# Patient Record
Sex: Female | Born: 1995
Health system: Southern US, Community
[De-identification: ages and names within clinical notes are randomized; demographics above are authoritative.]

## PROBLEM LIST (undated history)

## (undated) DIAGNOSIS — F410 Panic disorder [episodic paroxysmal anxiety] without agoraphobia: Secondary | ICD-10-CM

## (undated) DIAGNOSIS — L709 Acne, unspecified: Secondary | ICD-10-CM

## (undated) DIAGNOSIS — N289 Disorder of kidney and ureter, unspecified: Secondary | ICD-10-CM

## (undated) DIAGNOSIS — K259 Gastric ulcer, unspecified as acute or chronic, without hemorrhage or perforation: Secondary | ICD-10-CM

## (undated) DIAGNOSIS — K5900 Constipation, unspecified: Secondary | ICD-10-CM

## (undated) DIAGNOSIS — E282 Polycystic ovarian syndrome: Secondary | ICD-10-CM

## (undated) DIAGNOSIS — F329 Major depressive disorder, single episode, unspecified: Secondary | ICD-10-CM

## (undated) DIAGNOSIS — A6000 Herpesviral infection of urogenital system, unspecified: Secondary | ICD-10-CM

## (undated) DIAGNOSIS — K589 Irritable bowel syndrome without diarrhea: Secondary | ICD-10-CM

## (undated) DIAGNOSIS — R809 Proteinuria, unspecified: Secondary | ICD-10-CM

## (undated) DIAGNOSIS — Z6841 Body Mass Index (BMI) 40.0 and over, adult: Secondary | ICD-10-CM

## (undated) DIAGNOSIS — F419 Anxiety disorder, unspecified: Secondary | ICD-10-CM

## (undated) DIAGNOSIS — K5909 Other constipation: Secondary | ICD-10-CM

## (undated) DIAGNOSIS — J45909 Unspecified asthma, uncomplicated: Secondary | ICD-10-CM

## (undated) DIAGNOSIS — I10 Essential (primary) hypertension: Secondary | ICD-10-CM

## (undated) DIAGNOSIS — T7840XA Allergy, unspecified, initial encounter: Secondary | ICD-10-CM

## (undated) DIAGNOSIS — F32A Depression, unspecified: Secondary | ICD-10-CM

## (undated) DIAGNOSIS — R7303 Prediabetes: Secondary | ICD-10-CM

## (undated) DIAGNOSIS — E8881 Metabolic syndrome: Secondary | ICD-10-CM

## (undated) DIAGNOSIS — L309 Dermatitis, unspecified: Secondary | ICD-10-CM

## (undated) DIAGNOSIS — M1990 Unspecified osteoarthritis, unspecified site: Secondary | ICD-10-CM

## (undated) DIAGNOSIS — E559 Vitamin D deficiency, unspecified: Secondary | ICD-10-CM

## (undated) HISTORY — DX: Disorder of kidney and ureter, unspecified: N28.9

## (undated) HISTORY — DX: Herpesviral infection of urogenital system, unspecified: A60.00

## (undated) HISTORY — DX: Vitamin D deficiency, unspecified: E55.9

## (undated) HISTORY — DX: Allergy, unspecified, initial encounter: T78.40XA

## (undated) HISTORY — DX: Proteinuria, unspecified: R80.9

## (undated) HISTORY — DX: Dermatitis, unspecified: L30.9

## (undated) HISTORY — DX: Essential (primary) hypertension: I10

## (undated) HISTORY — DX: Irritable bowel syndrome, unspecified: K58.9

## (undated) HISTORY — DX: Acne, unspecified: L70.9

## (undated) HISTORY — DX: Depression, unspecified: F32.A

## (undated) HISTORY — DX: Polycystic ovarian syndrome: E28.2

## (undated) HISTORY — DX: Major depressive disorder, single episode, unspecified: F32.9

## (undated) HISTORY — DX: Body Mass Index (BMI) 40.0 and over, adult: Z684

## (undated) HISTORY — DX: Metabolic syndrome: E88.810

## (undated) HISTORY — DX: Unspecified osteoarthritis, unspecified site: M19.90

## (undated) HISTORY — DX: Prediabetes: R73.03

## (undated) HISTORY — DX: Panic disorder (episodic paroxysmal anxiety): F41.0

## (undated) HISTORY — DX: Metabolic syndrome: E88.81

## (undated) HISTORY — DX: Morbid (severe) obesity due to excess calories: E66.01

## (undated) HISTORY — DX: Gastric ulcer, unspecified as acute or chronic, without hemorrhage or perforation: K25.9

## (undated) HISTORY — DX: Constipation, unspecified: K59.00

## (undated) HISTORY — DX: Anxiety disorder, unspecified: F41.9

## (undated) HISTORY — DX: Unspecified asthma, uncomplicated: J45.909

## (undated) HISTORY — DX: Other constipation: K59.09

---
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
## 2014-03-13 ENCOUNTER — Emergency Department: Payer: Self-pay | Admitting: Internal Medicine

## 2014-11-09 ENCOUNTER — Encounter: Payer: Self-pay | Admitting: Family Medicine

## 2014-11-09 ENCOUNTER — Ambulatory Visit (INDEPENDENT_AMBULATORY_CARE_PROVIDER_SITE_OTHER): Payer: Federal, State, Local not specified - PPO | Admitting: Family Medicine

## 2014-11-09 VITALS — BP 132/86 | HR 93 | Temp 98.3°F | Resp 20 | Ht 66.0 in | Wt 253.0 lb

## 2014-11-09 DIAGNOSIS — F419 Anxiety disorder, unspecified: Secondary | ICD-10-CM | POA: Insufficient documentation

## 2014-11-09 DIAGNOSIS — K5909 Other constipation: Secondary | ICD-10-CM | POA: Insufficient documentation

## 2014-11-09 DIAGNOSIS — E559 Vitamin D deficiency, unspecified: Secondary | ICD-10-CM | POA: Insufficient documentation

## 2014-11-09 DIAGNOSIS — J069 Acute upper respiratory infection, unspecified: Secondary | ICD-10-CM

## 2014-11-09 DIAGNOSIS — R809 Proteinuria, unspecified: Secondary | ICD-10-CM | POA: Insufficient documentation

## 2014-11-09 DIAGNOSIS — E8881 Metabolic syndrome: Secondary | ICD-10-CM | POA: Insufficient documentation

## 2014-11-09 DIAGNOSIS — J302 Other seasonal allergic rhinitis: Secondary | ICD-10-CM | POA: Insufficient documentation

## 2014-11-09 DIAGNOSIS — A6009 Herpesviral infection of other urogenital tract: Secondary | ICD-10-CM | POA: Insufficient documentation

## 2014-11-09 DIAGNOSIS — J4520 Mild intermittent asthma, uncomplicated: Secondary | ICD-10-CM | POA: Insufficient documentation

## 2014-11-09 DIAGNOSIS — K589 Irritable bowel syndrome without diarrhea: Secondary | ICD-10-CM | POA: Insufficient documentation

## 2014-11-09 DIAGNOSIS — L309 Dermatitis, unspecified: Secondary | ICD-10-CM | POA: Insufficient documentation

## 2014-11-09 DIAGNOSIS — E282 Polycystic ovarian syndrome: Secondary | ICD-10-CM | POA: Insufficient documentation

## 2014-11-09 DIAGNOSIS — Z6838 Body mass index (BMI) 38.0-38.9, adult: Secondary | ICD-10-CM

## 2014-11-09 DIAGNOSIS — I10 Essential (primary) hypertension: Secondary | ICD-10-CM | POA: Insufficient documentation

## 2014-11-09 MED ORDER — BENZONATATE 100 MG PO CAPS: 100.0000 mg | ORAL_CAPSULE | Freq: Three times a day (TID) | ORAL | Status: DC | PRN

## 2014-11-09 NOTE — Progress Notes (Signed)
Name: Rachel Dunlap   MRN: [id number]    DOB: [birth year]   Date:11/09/2014       Progress Note  Subjective  Chief Complaint  Chief Complaint  Patient presents with  . URI    Onset-2 days-Scratchy throat, runny nose, and is taking OTC Mucinex and symptoms are worsening.    HPI  URI: she developed scratchy throat, rhinorrhea, nasal congestion, sinus burning, and a dry cough. She has been working many hours at work. She has been afebrile, normal appetite. No rashes  Patient Active Problem List   Diagnosis Date Noted  . Chronic constipation 11/09/2014  . Chronic dermatitis 11/09/2014  . Dysmetabolic syndrome 11/09/2014  . BP (high blood pressure) 11/09/2014  . Genital herpes in women 11/09/2014  . History of asthma 11/09/2014  . Allergic rhinitis, seasonal 11/09/2014  . Extreme obesity 11/09/2014  . IBS (irritable bowel syndrome) 11/09/2014  . Bilateral polycystic ovarian syndrome 11/09/2014  . Abnormal presence of protein in urine 11/09/2014  . Vitamin D deficiency 11/09/2014  . Anxiety 11/09/2014    No past surgical history on file.  Family History  Problem Relation Age of Onset  . Hypertension Mother     History   Social History  . Marital Status: Single    Spouse Name: N/A  . Number of Children: N/A  . Years of Education: N/A   Occupational History  . Not on file.   Social History Main Topics  . Smoking status: Never Smoker   . Smokeless tobacco: Never Used  . Alcohol Use: No  . Drug Use: No  . Sexual Activity:    Partners: Male    Birth Control/ Protection: Pill   Other Topics Concern  . Not on file   Social History Narrative     Current outpatient prescriptions:  .  atenolol (TENORMIN) 25 MG tablet, Take 1 tablet by mouth every evening., Disp: , Rfl:  .  Cholecalciferol (VITAMIN D) 2000 UNITS CAPS, Take 1 capsule by mouth daily., Disp: , Rfl:  .  desogestrel-ethinyl estradiol (APRI) 0.15-30 MG-MCG tablet, Take 1 tablet by mouth daily., Disp: ,  Rfl:  .  desoximetasone (TOPICORT) 0.25 % cream, Apply 1 application topically 2 (two) times daily., Disp: , Rfl:  .  fluticasone (FLONASE) 50 MCG/ACT nasal spray, Place 2 sprays into the nose at bedtime., Disp: , Rfl:  .  loratadine (CLARITIN) 10 MG tablet, Take 1 tablet by mouth daily., Disp: , Rfl:  .  polyethylene glycol (MIRALAX / GLYCOLAX) packet, Take by mouth., Disp: , Rfl:  .  valACYclovir (VALTREX) 500 MG tablet, Take 1 tablet by mouth 3 (three) times daily as needed., Disp: , Rfl:  .  benzonatate (TESSALON) 100 MG capsule, Take 1 capsule (100 mg total) by mouth 3 (three) times daily as needed for cough., Disp: 30 capsule, Rfl: 0 .  enalapril (VASOTEC) 5 MG tablet, Take 1 tablet by mouth daily., Disp: , Rfl:   No Known Allergies   ROS  Ten systems reviewed and is negative except as mentioned in HPI   Objective  Filed Vitals:   11/09/14 1203  BP: 132/86  Pulse: 93  Temp: 98.3 F (36.8 C)  TempSrc: Oral  Resp: 20  Height:  (1.676 m)  Weight: 253 lb (114.76 kg)  SpO2: 98%    Body mass index is 40.85 kg/(m^2).  Physical Exam  Constitutional: Patient appears well-developed and well-nourished. Obese  No distress.  HEENT: head atraumatic, normocephalic, ears normal, eyes within normal limits clear  rhinorrhea, boggy turbinates, normal throat exam, no cervical lymphadenopathy Neck: Normal range of motion. Neck supple. Cardiovascular: Normal rate, regular rhythm and normal heart sounds.  No murmur heard. No BLE edema. Pulmonary/Chest: Effort normal and breath sounds normal. No respiratory distress. Abdominal: Soft.  There is no tenderness. Psychiatric: Patient has a normal mood and affect. behavior is normal. Judgment and thought content normal.    PHQ2/9: Depression screen PHQ 2/9 11/09/2014  Decreased Interest 0  Down, Depressed, Hopeless 0  PHQ - 2 Score 0     Fall Risk: Fall Risk  11/09/2014  Falls in the past year? No    Assessment & Plan  1. Upper  respiratory infection Time off work for the next three days, rest, fluids, gargle with warm salted water, use saline spray for nasal symptoms, and continue otc medication prn, return if no improvement - benzonatate (TESSALON) 100 MG capsule; Take 1 capsule (100 mg total) by mouth 3 (three) times daily as needed for cough.  Dispense: 30 capsule; Refill: 0

## 2014-12-17 ENCOUNTER — Other Ambulatory Visit: Payer: Self-pay | Admitting: Family Medicine

## 2014-12-26 ENCOUNTER — Other Ambulatory Visit: Payer: Self-pay | Admitting: Family Medicine

## 2014-12-26 NOTE — Telephone Encounter (Signed)
Patient requesting refill. 

## 2014-12-27 NOTE — Telephone Encounter (Signed)
Tried contacting patient and voicemail not set up yet. Will try again later.

## 2015-01-02 NOTE — Telephone Encounter (Signed)
Tried contacting patient again and no voicemail set up yet.

## 2015-04-12 ENCOUNTER — Ambulatory Visit: Payer: Federal, State, Local not specified - PPO | Admitting: Family Medicine

## 2015-05-17 ENCOUNTER — Other Ambulatory Visit: Payer: Self-pay | Admitting: Family Medicine

## 2015-06-13 ENCOUNTER — Encounter: Payer: Self-pay | Admitting: Family Medicine

## 2015-06-13 ENCOUNTER — Ambulatory Visit (INDEPENDENT_AMBULATORY_CARE_PROVIDER_SITE_OTHER): Payer: Federal, State, Local not specified - PPO | Admitting: Family Medicine

## 2015-06-13 VITALS — BP 114/70 | HR 88 | Temp 98.8°F | Resp 18 | Wt 260.2 lb

## 2015-06-13 DIAGNOSIS — J4521 Mild intermittent asthma with (acute) exacerbation: Secondary | ICD-10-CM | POA: Diagnosis not present

## 2015-06-13 DIAGNOSIS — I10 Essential (primary) hypertension: Secondary | ICD-10-CM

## 2015-06-13 DIAGNOSIS — J069 Acute upper respiratory infection, unspecified: Secondary | ICD-10-CM | POA: Diagnosis not present

## 2015-06-13 DIAGNOSIS — Z23 Encounter for immunization: Secondary | ICD-10-CM | POA: Diagnosis not present

## 2015-06-13 MED ORDER — BENZONATATE 100 MG PO CAPS: 100.0000 mg | ORAL_CAPSULE | Freq: Three times a day (TID) | ORAL | Status: DC | PRN

## 2015-06-13 MED ORDER — PREDNISONE 10 MG PO TABS: 10.0000 mg | ORAL_TABLET | Freq: Every day | ORAL | Status: DC

## 2015-06-13 MED ORDER — ALBUTEROL SULFATE HFA 108 (90 BASE) MCG/ACT IN AERS: 2.0000 | INHALATION_SPRAY | Freq: Four times a day (QID) | RESPIRATORY_TRACT | Status: DC | PRN

## 2015-06-13 MED ORDER — ATENOLOL 25 MG PO TABS: 25.0000 mg | ORAL_TABLET | Freq: Every day | ORAL | Status: DC

## 2015-06-13 NOTE — Progress Notes (Signed)
Name: Rachel Dunlap  MRN: [id number]    DOB: 1995/12/26   Date:06/13/2015       Progress Note  Subjective  Chief Complaint  Chief Complaint  Patient presents with  . Sinusitis    patient presents with sx of chills, nasal congestion, sneezing, facial pain and pressure.  . Flu Vaccine    HPI  URI: she developed chills, rhinorrhea, nasal congestion, facial pressure and cough for the past two days. Appetite is finally getting back to normal.   Asthma Mild with exacerbation: no symptoms of asthma for years, but since this URI, she has noticed some loud breathing at night, possible wheezing, and a cough that is mostly dry but wet at times and has notice some SOB. She does not have an inhaler at home  HTN: she has a history of proteinuria, sees nephrologist, no recent visits with us. Denies side effects of medication. No chest pain but has occasional palpitation.    Patient Active Problem List   Diagnosis Date Noted  . Chronic constipation 11/09/2014  . Chronic dermatitis 11/09/2014  . Dysmetabolic syndrome 11/09/2014  . Hypertension, benign 11/09/2014  . Genital herpes in women 11/09/2014  . Mild intermittent asthma with acute exacerbation 11/09/2014  . Allergic rhinitis, seasonal 11/09/2014  . Extreme obesity (HCC) 11/09/2014  . IBS (irritable bowel syndrome) 11/09/2014  . Bilateral polycystic ovarian syndrome 11/09/2014  . Abnormal presence of protein in urine 11/09/2014  . Vitamin D deficiency 11/09/2014  . Anxiety 11/09/2014    History reviewed. No pertinent past surgical history.  Family History  Problem Relation Age of Onset  . Hypertension Mother     Social History   Social History  . Marital Status: Single    Spouse Name: N/A  . Number of Children: N/A  . Years of Education: N/A   Occupational History  . Not on file.   Social History Main Topics  . Smoking status: Never Smoker   . Smokeless tobacco: Never Used  . Alcohol Use: No  . Drug Use: No  .  Sexual Activity:    Partners: Male    Birth Control/ Protection: Pill   Other Topics Concern  . Not on file   Social History Narrative     Current outpatient prescriptions:  .  APRI 0.15-30 MG-MCG tablet, 1 (ONE) TABLET, ORAL, DAILY, Disp: 84 tablet, Rfl: 3 .  atenolol (TENORMIN) 25 MG tablet, Take 1 tablet (25 mg total) by mouth at bedtime., Disp: 30 tablet, Rfl: 2 .  Cholecalciferol (VITAMIN D) 2000 UNITS CAPS, Take 1 capsule by mouth daily., Disp: , Rfl:  .  desoximetasone (TOPICORT) 0.25 % cream, Apply 1 application topically 2 (two) times daily., Disp: , Rfl:  .  fluticasone (FLONASE) 50 MCG/ACT nasal spray, Place 2 sprays into the nose at bedtime., Disp: , Rfl:  .  loratadine (CLARITIN) 10 MG tablet, Take 1 tablet by mouth daily., Disp: , Rfl:  .  polyethylene glycol (MIRALAX / GLYCOLAX) packet, Take by mouth., Disp: , Rfl:  .  albuterol (PROVENTIL HFA;VENTOLIN HFA) 108 (90 Base) MCG/ACT inhaler, Inhale 2 puffs into the lungs every 6 (six) hours as needed for wheezing or shortness of breath., Disp: 1 Inhaler, Rfl: 0 .  benzonatate (TESSALON) 100 MG capsule, Take 1-2 capsules (100-200 mg total) by mouth 3 (three) times daily as needed for cough., Disp: 40 capsule, Rfl: 0 .  enalapril (VASOTEC) 5 MG tablet, Take 1 tablet by mouth daily., Disp: , Rfl:  .  predniSONE (DELTASONE)  10 MG tablet, Take 1 tablet (10 mg total) by mouth daily with breakfast., Disp: 6 tablet, Rfl: 0 .  valACYclovir (VALTREX) 500 MG tablet, TAKE 1 TABLET 3 TIMES A DAY DURING OUTBREAKS OTHERWISE TAKE 1 TABLET DAILY (Patient not taking: Reported on 06/13/2015), Disp: 35 tablet, Rfl: 5  No Known Allergies   ROS  Ten systems reviewed and is negative except as mentioned in HPI   Objective  Filed Vitals:   06/13/15 0959 06/13/15 1000  BP: 114/70   Pulse: 111 88  Temp: 98.8 F (37.1 C)   TempSrc: Oral   Resp: 18   Weight: 260 lb 3.2 oz (118.026 kg)   SpO2: 98%     Body mass index is 42.02  kg/(m^2).  Physical Exam  Constitutional: Patient appears well-developed and well-nourished. Obese  No distress.  HEENT: head atraumatic, normocephalic, pupils equal and reactive to light, ears normal TM bilaterally, clear rhinorrhea, boggy turbinates, mild tenderness during percussion of sinuses, neck supple, throat within normal limits Cardiovascular: Normal rate, regular rhythm and normal heart sounds.  No murmur heard. No BLE edema. Pulmonary/Chest: Effort normal and breath sounds normal. No respiratory distress. Abdominal: Soft.  There is no tenderness. Psychiatric: Patient has a normal mood and affect. behavior is normal. Judgment and thought content normal.   PHQ2/9: Depression screen [facility] 2/9 06/13/2015 11/09/2014  Decreased Interest 0 0  Down, Depressed, Hopeless 0 0  PHQ - 2 Score 0 0     Fall Risk: Fall Risk  06/13/2015 11/09/2014  Falls in the past year? No No     Functional Status Survey: Is the patient deaf or have difficulty hearing?: Yes (patient stated that she has a hard time hearing) Does the patient have difficulty seeing, even when wearing glasses/contacts?: No Does the patient have difficulty concentrating, remembering, or making decisions?: No Does the patient have difficulty walking or climbing stairs?: No Does the patient have difficulty dressing or bathing?: No Does the patient have difficulty doing errands alone such as visiting a doctor's office or shopping?: No    Assessment & Plan  1. Upper respiratory infection  - predniSONE (DELTASONE) 10 MG tablet; Take 1 tablet (10 mg total) by mouth daily with breakfast.  Dispense: 6 tablet; Refill: 0 - benzonatate (TESSALON) 100 MG capsule; Take 1-2 capsules (100-200 mg total) by mouth 3 (three) times daily as needed for cough.  Dispense: 40 capsule; Refill: 0  2. Flu vaccine need  - Flu Vaccine QUAD 36+ mos PF IM (Fluarix & Fluzone Quad PF)  3. Hypertension, benign  - atenolol (TENORMIN) 25 MG tablet; Take  1 tablet (25 mg total) by mouth at bedtime.  Dispense: 30 tablet; Refill: 2  4. Mild intermittent asthma with acute exacerbation  - albuterol (PROVENTIL HFA;VENTOLIN HFA) 108 (90 Base) MCG/ACT inhaler; Inhale 2 puffs into the lungs every 6 (six) hours as needed for wheezing or shortness of breath.  Dispense: 1 Inhaler; Refill: 0

## 2015-08-23 ENCOUNTER — Other Ambulatory Visit: Payer: Self-pay | Admitting: Family Medicine

## 2015-08-27 ENCOUNTER — Other Ambulatory Visit: Payer: Self-pay | Admitting: Family Medicine

## 2015-09-16 ENCOUNTER — Encounter: Payer: Self-pay | Admitting: Family Medicine

## 2015-09-16 ENCOUNTER — Ambulatory Visit (INDEPENDENT_AMBULATORY_CARE_PROVIDER_SITE_OTHER): Payer: Federal, State, Local not specified - PPO | Admitting: Family Medicine

## 2015-09-16 VITALS — BP 124/82 | HR 71 | Temp 99.1°F | Resp 16 | Ht 66.0 in | Wt 261.2 lb

## 2015-09-16 DIAGNOSIS — I10 Essential (primary) hypertension: Secondary | ICD-10-CM

## 2015-09-16 DIAGNOSIS — L689 Hypertrichosis, unspecified: Secondary | ICD-10-CM | POA: Insufficient documentation

## 2015-09-16 DIAGNOSIS — J302 Other seasonal allergic rhinitis: Secondary | ICD-10-CM | POA: Diagnosis not present

## 2015-09-16 DIAGNOSIS — L309 Dermatitis, unspecified: Secondary | ICD-10-CM | POA: Diagnosis not present

## 2015-09-16 DIAGNOSIS — A6000 Herpesviral infection of urogenital system, unspecified: Secondary | ICD-10-CM

## 2015-09-16 DIAGNOSIS — E282 Polycystic ovarian syndrome: Secondary | ICD-10-CM | POA: Diagnosis not present

## 2015-09-16 DIAGNOSIS — E8881 Metabolic syndrome: Secondary | ICD-10-CM

## 2015-09-16 DIAGNOSIS — K5900 Constipation, unspecified: Secondary | ICD-10-CM

## 2015-09-16 DIAGNOSIS — Z23 Encounter for immunization: Secondary | ICD-10-CM | POA: Diagnosis not present

## 2015-09-16 DIAGNOSIS — K5909 Other constipation: Secondary | ICD-10-CM

## 2015-09-16 DIAGNOSIS — L700 Acne vulgaris: Secondary | ICD-10-CM

## 2015-09-16 DIAGNOSIS — Z13220 Encounter for screening for lipoid disorders: Secondary | ICD-10-CM | POA: Diagnosis not present

## 2015-09-16 DIAGNOSIS — J4520 Mild intermittent asthma, uncomplicated: Secondary | ICD-10-CM

## 2015-09-16 DIAGNOSIS — E559 Vitamin D deficiency, unspecified: Secondary | ICD-10-CM

## 2015-09-16 MED ORDER — FLUTICASONE PROPIONATE 50 MCG/ACT NA SUSP: 2.0000 | Freq: Every day | NASAL | Status: DC

## 2015-09-16 MED ORDER — DESOXIMETASONE 0.25 % EX CREA: 1.0000 "application " | TOPICAL_CREAM | Freq: Two times a day (BID) | CUTANEOUS | Status: DC

## 2015-09-16 MED ORDER — ATENOLOL 25 MG PO TABS: ORAL_TABLET | ORAL | Status: DC

## 2015-09-16 MED ORDER — VALACYCLOVIR HCL 500 MG PO TABS: 500.0000 mg | ORAL_TABLET | Freq: Every day | ORAL | Status: DC

## 2015-09-16 MED ORDER — MONTELUKAST SODIUM 10 MG PO TABS: 10.0000 mg | ORAL_TABLET | Freq: Every day | ORAL | Status: DC

## 2015-09-16 MED ORDER — METFORMIN HCL 500 MG PO TABS: 500.0000 mg | ORAL_TABLET | Freq: Every day | ORAL | Status: DC

## 2015-09-16 MED ORDER — POLYETHYLENE GLYCOL 3350 17 GM/SCOOP PO POWD: 17.0000 g | Freq: Two times a day (BID) | ORAL | Status: DC | PRN

## 2015-09-16 NOTE — Progress Notes (Signed)
Name: Rachel Dunlap   MRN: [id number]    DOB: 1995-12-24   Date:09/16/2015       Progress Note  Subjective  Chief Complaint  Chief Complaint  Patient presents with  . Asthma    patient is here for her 29-month f/u  . Hypertension    no neg sx  . Medication Refill    90 day supply    HPI  Asthma Mild : no symptoms of asthma for years, only has flares when she has an URI, one episode in the last year. She is currently only using rescue inhaler before activity. Denies wheezing, no SOB or cough.  AR: taking Loratadine daily and also Fluticasone, but she has noticed clear rhinorrhea, and sometimes has a sinus headache and would like to resume singulair.   HTN: she has a history of proteinuria, sees nephrologist. Denies side effects of medication. No chest pain but has occasional palpitation. She is on Vasotec and Atenolol  Dysmetabolism syndrome: she is due for labs, weight has been stable since last visit, but has gained 7 lbs since last Summer. She has acanthosis nigricans. Denies polyphagia or polyuria but she feels thirsty all the time.  Constipation: she has been more physically active and has been drinking more water and is doing well, having bowel movements every other day, no straining, no blood in stools  Obesity: she has joined [employer] one month ago. Trying to only drink water, but she has not lost weight, discussed importance of life style modification for overall health.   Genital Herpes: not currently sexually active, no episodes since first outbreak.   Patient Active Problem List   Diagnosis Date Noted  . Chronic constipation 11/09/2014  . Chronic dermatitis 11/09/2014  . Dysmetabolic syndrome 11/09/2014  . Hypertension, benign 11/09/2014  . Genital herpes in women 11/09/2014  . Mild intermittent asthma 11/09/2014  . Allergic rhinitis, seasonal 11/09/2014  . Extreme obesity (HCC) 11/09/2014  . IBS (irritable bowel syndrome) 11/09/2014  . Bilateral  polycystic ovarian syndrome 11/09/2014  . Abnormal presence of protein in urine 11/09/2014  . Vitamin D deficiency 11/09/2014  . Anxiety 11/09/2014    History reviewed. No pertinent past surgical history.  Family History  Problem Relation Age of Onset  . Hypertension Mother     Social History   Social History  . Marital Status: Single    Spouse Name: N/A  . Number of Children: N/A  . Years of Education: N/A   Occupational History  . Not on file.   Social History Main Topics  . Smoking status: Never Smoker   . Smokeless tobacco: Never Used  . Alcohol Use: No  . Drug Use: No  . Sexual Activity:    Partners: Male    Birth Control/ Protection: Pill   Other Topics Concern  . Not on file   Social History Narrative     Current outpatient prescriptions:  .  albuterol (PROVENTIL HFA;VENTOLIN HFA) 108 (90 Base) MCG/ACT inhaler, Inhale 2 puffs into the lungs every 6 (six) hours as needed for wheezing or shortness of breath., Disp: 1 Inhaler, Rfl: 0 .  APRI 0.15-30 MG-MCG tablet, 1 (ONE) TABLET, ORAL, DAILY, Disp: 84 tablet, Rfl: 3 .  atenolol (TENORMIN) 25 MG tablet, TAKE 1 TABLET BY MOUTH EVERY EVENING FOR BP AND ANXIETY, Disp: 30 tablet, Rfl: 5 .  Cholecalciferol (VITAMIN D) 2000 UNITS CAPS, Take 1 capsule by mouth daily., Disp: , Rfl:  .  desoximetasone (TOPICORT) 0.25 % cream, Apply  1 application topically 2 (two) times daily., Disp: 100 g, Rfl: 0 .  enalapril (VASOTEC) 5 MG tablet, Take 1 tablet by mouth daily., Disp: , Rfl:  .  fluticasone (FLONASE) 50 MCG/ACT nasal spray, Place 2 sprays into both nostrils at bedtime., Disp: 16 g, Rfl: 5 .  loratadine (CLARITIN) 10 MG tablet, TAKE 1 TABLET BY MOUTH DAILY, Disp: 30 tablet, Rfl: 3 .  montelukast (SINGULAIR) 10 MG tablet, Take 1 tablet (10 mg total) by mouth at bedtime., Disp: 30 tablet, Rfl: 5 .  polyethylene glycol powder (GLYCOLAX/MIRALAX) powder, Take 17 g by mouth 2 (two) times daily as needed., Disp: 3350 g, Rfl: 1 .   valACYclovir (VALTREX) 500 MG tablet, TAKE 1 TABLET 3 TIMES A DAY DURING OUTBREAKS OTHERWISE TAKE 1 TABLET DAILY, Disp: 35 tablet, Rfl: 5  No Known Allergies   ROS  Constitutional: Negative for fever or weight change.  Respiratory: Negative for cough and shortness of breath.   Cardiovascular: Negative for chest pain or palpitations.  Gastrointestinal: Negative for abdominal pain, no bowel changes ( improved constipation because she has been exercising and drinking more water).  Musculoskeletal: Negative for gait problem or joint swelling.  Skin: Negative for rash.  Neurological: Negative for dizziness or headache.  No other specific complaints in a complete review of systems (except as listed in HPI above).  Objective  Filed Vitals:   09/16/15 1015  BP: 124/82  Pulse: 71  Temp: 99.1 F (37.3 C)  TempSrc: Oral  Resp: 16  Height: 5\' 6"  (1.676 m)  Weight: 261 lb 3.2 oz (118.48 kg)  SpO2: 98%    Body mass index is 42.18 kg/(m^2).  Physical Exam    Constitutional: Patient appears well-developed and well-nourished. Obese  No distress.  HEENT: head atraumatic, normocephalic, pupils equal and reactive to light,  neck supple, throat within normal limits Cardiovascular: Normal rate, regular rhythm and normal heart sounds.  No murmur heard. No BLE edema. Pulmonary/Chest: Effort normal and breath sounds normal. No respiratory distress. Abdominal: Soft.  There is no tenderness. Psychiatric: Patient has a normal mood and affect. behavior is normal. Judgment and thought content normal. Skin : acanthosis nigricans neck and axilla, facial acne, hypertrichosis on her chin  PHQ2/9: [facility] screen [facility] 2/9 09/16/2015 06/13/2015 11/09/2014  Decreased Interest 0 0 0  Down, Depressed, Hopeless 0 0 0  PHQ - 2 Score 0 0 0     Fall Risk: Fall Risk  09/16/2015 06/13/2015 11/09/2014  Falls in the past year? No No No     Functional Status Survey: Is the patient deaf or have difficulty hearing?:  No Does the patient have difficulty seeing, even when wearing glasses/contacts?: No Does the patient have difficulty concentrating, remembering, or making decisions?: No Does the patient have difficulty walking or climbing stairs?: No Does the patient have difficulty dressing or bathing?: No Does the patient have difficulty doing errands alone such as visiting a doctor's office or shopping?: No    Assessment & Plan  1. Hypertension, benign  At goal, continue follow up with Dr. Ronn MelenaKolloru - Comprehensive metabolic panel - CBC with Differential/Platelet - atenolol (TENORMIN) 25 MG tablet; TAKE 1 TABLET BY MOUTH EVERY EVENING FOR BP AND ANXIETY  Dispense: 90 tablet; Refill: 1  2. Mild intermittent asthma, uncomplicated  Using inhaler only before activity, would like to add singulair to help with allergy symptoms - montelukast (SINGULAIR) 10 MG tablet; Take 1 tablet (10 mg total) by mouth at bedtime.  Dispense: 90 tablet; Refill:  1  3. Morbid obesity, unspecified obesity type [facility])  Discussed with the patient the risk posed by an increased BMI. Discussed importance of portion control, calorie counting and at least 150 minutes of physical activity weekly. Avoid sweet beverages and drink more water. Eat at least 6 servings of fruit and vegetables daily   4. Chronic constipation  - polyethylene glycol powder (GLYCOLAX/MIRALAX) powder; Take 17 g by mouth 2 (two) times daily as needed.  Dispense: 3350 g; Refill: 1  5. Chronic dermatitis  - Ambulatory referral to Dermatology  6. Allergic rhinitis, seasonal  - montelukast (SINGULAIR) 10 MG tablet; Take 1 tablet (10 mg total) by mouth at bedtime.  Dispense: 90 tablet; Refill: 1 - fluticasone (FLONASE) 50 MCG/ACT nasal spray; Place 2 sprays into both nostrils at bedtime.  Dispense: 48 g; Refill: 1  7. Need for varicella vaccine  - Varicella vaccine subcutaneous  8. Need for meningococcal vaccination  - Meningococcal conjugate vaccine  4-valent IM  9. Lipid screening  - Lipid panel  10. Metabolic syndrome  - Hemoglobin A1c - Comprehensive metabolic panel  11. Bilateral polycystic ovarian syndrome  - Hemoglobin A1c Starting her on Metformin 500 mg daily #90 and 1 refill, possible side effects discussed with patient  12. Vitamin D deficiency  - VITAMIN D 25 Hydroxy (Vit-D Deficiency, Fractures)  13. Genital herpes  - valACYclovir (VALTREX) 500 MG tablet; Take 1 tablet (500 mg total) by mouth daily. And twice daily on outbreaks  Dispense: 115 tablet; Refill: 1  14. Acne vulgaris  Referral to dermatologist  15. Hypertrichosis  Referral to dermatologist

## 2015-11-01 DIAGNOSIS — L700 Acne vulgaris: Secondary | ICD-10-CM | POA: Diagnosis not present

## 2015-11-22 DIAGNOSIS — R809 Proteinuria, unspecified: Secondary | ICD-10-CM | POA: Diagnosis not present

## 2015-11-22 DIAGNOSIS — I10 Essential (primary) hypertension: Secondary | ICD-10-CM | POA: Diagnosis not present

## 2015-11-28 ENCOUNTER — Encounter: Payer: Self-pay | Admitting: Family Medicine

## 2015-12-10 ENCOUNTER — Ambulatory Visit (INDEPENDENT_AMBULATORY_CARE_PROVIDER_SITE_OTHER): Payer: Federal, State, Local not specified - PPO

## 2015-12-10 DIAGNOSIS — Z23 Encounter for immunization: Secondary | ICD-10-CM

## 2015-12-28 ENCOUNTER — Other Ambulatory Visit: Payer: Self-pay | Admitting: Family Medicine

## 2016-01-21 DIAGNOSIS — K080 Exfoliation of teeth due to systemic causes: Secondary | ICD-10-CM | POA: Diagnosis not present

## 2016-02-24 ENCOUNTER — Other Ambulatory Visit: Payer: Self-pay

## 2016-02-24 DIAGNOSIS — J302 Other seasonal allergic rhinitis: Secondary | ICD-10-CM

## 2016-02-24 DIAGNOSIS — J4520 Mild intermittent asthma, uncomplicated: Secondary | ICD-10-CM

## 2016-02-24 MED ORDER — MONTELUKAST SODIUM 10 MG PO TABS: 10.0000 mg | ORAL_TABLET | Freq: Every day | ORAL | 1 refills | Status: DC

## 2016-02-24 NOTE — Telephone Encounter (Signed)
Patient requesting refill of Montelukast Sodium.
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
## 2016-03-13 ENCOUNTER — Ambulatory Visit (INDEPENDENT_AMBULATORY_CARE_PROVIDER_SITE_OTHER): Payer: Federal, State, Local not specified - PPO | Admitting: Family Medicine

## 2016-03-13 ENCOUNTER — Other Ambulatory Visit: Payer: Self-pay | Admitting: Family Medicine

## 2016-03-13 ENCOUNTER — Encounter: Payer: Self-pay | Admitting: Family Medicine

## 2016-03-13 VITALS — BP 132/82 | HR 99 | Temp 98.5°F | Resp 18 | Ht 66.25 in | Wt 261.9 lb

## 2016-03-13 DIAGNOSIS — Z13220 Encounter for screening for lipoid disorders: Secondary | ICD-10-CM

## 2016-03-13 DIAGNOSIS — J302 Other seasonal allergic rhinitis: Secondary | ICD-10-CM

## 2016-03-13 DIAGNOSIS — Z114 Encounter for screening for human immunodeficiency virus [HIV]: Secondary | ICD-10-CM

## 2016-03-13 DIAGNOSIS — Z124 Encounter for screening for malignant neoplasm of cervix: Secondary | ICD-10-CM | POA: Diagnosis not present

## 2016-03-13 DIAGNOSIS — J3089 Other allergic rhinitis: Secondary | ICD-10-CM

## 2016-03-13 DIAGNOSIS — E559 Vitamin D deficiency, unspecified: Secondary | ICD-10-CM | POA: Diagnosis not present

## 2016-03-13 DIAGNOSIS — K5909 Other constipation: Secondary | ICD-10-CM | POA: Diagnosis not present

## 2016-03-13 DIAGNOSIS — L2082 Flexural eczema: Secondary | ICD-10-CM | POA: Diagnosis not present

## 2016-03-13 DIAGNOSIS — A6004 Herpesviral vulvovaginitis: Secondary | ICD-10-CM

## 2016-03-13 DIAGNOSIS — E282 Polycystic ovarian syndrome: Secondary | ICD-10-CM | POA: Diagnosis not present

## 2016-03-13 DIAGNOSIS — E8881 Metabolic syndrome: Secondary | ICD-10-CM | POA: Diagnosis not present

## 2016-03-13 DIAGNOSIS — J4520 Mild intermittent asthma, uncomplicated: Secondary | ICD-10-CM | POA: Diagnosis not present

## 2016-03-13 DIAGNOSIS — E668 Other obesity: Secondary | ICD-10-CM

## 2016-03-13 DIAGNOSIS — Z01419 Encounter for gynecological examination (general) (routine) without abnormal findings: Secondary | ICD-10-CM | POA: Diagnosis not present

## 2016-03-13 DIAGNOSIS — I10 Essential (primary) hypertension: Secondary | ICD-10-CM

## 2016-03-13 LAB — COMPLETE METABOLIC PANEL WITH GFR
ALBUMIN: 3.8 g/dL (ref 3.6–5.1)
ALK PHOS: 55 U/L (ref 33–115)
ALT: 9 U/L (ref 6–29)
AST: 14 U/L (ref 10–30)
BUN: 9 mg/dL (ref 7–25)
CALCIUM: 9.1 mg/dL (ref 8.6–10.2)
CHLORIDE: 106 mmol/L (ref 98–110)
CO2: 20 mmol/L (ref 20–31)
Creat: 0.79 mg/dL (ref 0.50–1.10)
Glucose, Bld: 89 mg/dL (ref 65–99)
POTASSIUM: 4.6 mmol/L (ref 3.5–5.3)
Sodium: 137 mmol/L (ref 135–146)
Total Bilirubin: 0.5 mg/dL (ref 0.2–1.2)
Total Protein: 7.1 g/dL (ref 6.1–8.1)

## 2016-03-13 LAB — LIPID PANEL
CHOL/HDL RATIO: 2.7 ratio (ref <5.0)
CHOLESTEROL: 164 mg/dL (ref <200)
HDL: 60 mg/dL (ref >50)
LDL Cholesterol: 84 mg/dL (ref <100)
TRIGLYCERIDES: 100 mg/dL (ref <150)
VLDL: 20 mg/dL (ref <30)

## 2016-03-13 MED ORDER — DESOXIMETASONE 0.25 % EX CREA: 1.0000 "application " | TOPICAL_CREAM | Freq: Two times a day (BID) | CUTANEOUS | 0 refills | Status: DC

## 2016-03-13 MED ORDER — METFORMIN HCL 500 MG PO TABS: 500.0000 mg | ORAL_TABLET | Freq: Every day | ORAL | 1 refills | Status: DC

## 2016-03-13 MED ORDER — VALACYCLOVIR HCL 500 MG PO TABS: 500.0000 mg | ORAL_TABLET | Freq: Every day | ORAL | 1 refills | Status: DC

## 2016-03-13 MED ORDER — FLUTICASONE PROPIONATE 50 MCG/ACT NA SUSP: 2.0000 | Freq: Every day | NASAL | 1 refills | Status: DC

## 2016-03-13 MED ORDER — ATENOLOL 25 MG PO TABS: ORAL_TABLET | ORAL | 1 refills | Status: DC

## 2016-03-13 NOTE — Patient Instructions (Signed)
Therapist : [place] Pinkasis

## 2016-03-13 NOTE — Progress Notes (Signed)
Name: Rachel Dunlap   MRN: [id number]    DOB: 20-Jan-1996   Date:03/13/2016       Progress Note  Subjective  Chief Complaint  Chief Complaint  Patient presents with  . Annual Exam    HPI   Well Woman: she had oral sex in the past and contracted genital herpes, never had intercourse. She denies breat lumps, no herpes outbreak since 2016 during her first outbreak. She takes medication daily. She denies vaginal discharge cycles are regular and takes ocp's to regulate cycles and PCOS.  PCOS:she has obesity, HTN, acne and hirsutism, and history of oligomenorrhea.  Asthma Mild : no symptoms of asthma for years, only has flares when she has an URI, one episode in the last year. She is currently only using rescue inhaler before activity. Denies wheezing, no SOB but has noticed a mild dry cough over the past few days. She is having symptoms of URI and is taking otc medication  AR: taking Loratadine, singulair daily and also Fluticasone most days to control symptoms. Usually has rhinorrhea and nasal congestion when she does not take medication  HTN: she has a history of proteinuria, sees nephrologist, Dr. Ronn MelenaKolloru. Denies side effects of medication - Vasotec. No chest pain but has occasional palpitation. She is on Vasotec and Atenolol  Dysmetabolism syndrome:  weight has been stable since last visit She has acanthosis nigricans. Denies polyphagia or polyuria but she feels thirsty all the time.  Constipation: she has been more physically active and has been drinking more water and is doing well, having bowel movements every other day, no straining, no blood in stools . She is doing well with life style modification  Obesity: she has joined [employer] 7 months ago, but she had to stop during the end of Fall semester, but she will go back this month.   Genital Herpes: not currently sexually active, no episodes since first outbreak in 2016 . She is still very upset about having  herpes, and the fact it was from oral sex and she never had intercourse. She is afraid of having a relationship with anyone else. Thinking about therapy    Patient Active Problem List   Diagnosis Date Noted  . Acne vulgaris 09/16/2015  . Hypertrichosis 09/16/2015  . Chronic constipation 11/09/2014  . Chronic dermatitis 11/09/2014  . Dysmetabolic syndrome 11/09/2014  . Hypertension, benign 11/09/2014  . Genital herpes in women 11/09/2014  . Mild intermittent asthma 11/09/2014  . Allergic rhinitis, seasonal 11/09/2014  . Extreme obesity (HCC) 11/09/2014  . IBS (irritable bowel syndrome) 11/09/2014  . Bilateral polycystic ovarian syndrome 11/09/2014  . Abnormal presence of protein in urine 11/09/2014  . Vitamin D deficiency 11/09/2014  . Anxiety 11/09/2014    History reviewed. No pertinent surgical history.  Family History  Problem Relation Age of Onset  . Hypertension Mother     Social History   Social History  . Marital status: Single    Spouse name: N/A  . Number of children: N/A  . Years of education: N/A   Occupational History  . Not on file.   Social History Main Topics  . Smoking status: Never Smoker  . Smokeless tobacco: Never Used  . Alcohol use No  . Drug use: No  . Sexual activity: Not Currently    Partners: Male    Birth control/ protection: Pill   Other Topics Concern  . Not on file   Social History Narrative  . No narrative on file  Current Outpatient Prescriptions:  .  albuterol (PROVENTIL HFA;VENTOLIN HFA) 108 (90 Base) MCG/ACT inhaler, Inhale 2 puffs into the lungs every 6 (six) hours as needed for wheezing or shortness of breath., Disp: 1 Inhaler, Rfl: 0 .  APRI 0.15-30 MG-MCG tablet, 1 (ONE) TABLET, ORAL, DAILY, Disp: 84 tablet, Rfl: 3 .  atenolol (TENORMIN) 25 MG tablet, TAKE 1 TABLET BY MOUTH EVERY EVENING FOR BP AND ANXIETY, Disp: 90 tablet, Rfl: 1 .  Cholecalciferol (VITAMIN D) 2000 UNITS CAPS, Take 1 capsule by mouth daily., Disp: ,  Rfl:  .  desoximetasone (TOPICORT) 0.25 % cream, Apply 1 application topically 2 (two) times daily., Disp: 100 g, Rfl: 0 .  enalapril (VASOTEC) 5 MG tablet, Take 1 tablet by mouth daily., Disp: , Rfl:  .  fluticasone (FLONASE) 50 MCG/ACT nasal spray, Place 2 sprays into both nostrils at bedtime., Disp: 48 g, Rfl: 1 .  loratadine (CLARITIN) 10 MG tablet, TAKE 1 TABLET BY MOUTH DAILY, Disp: 30 tablet, Rfl: 3 .  metFORMIN (GLUCOPHAGE) 500 MG tablet, Take 1 tablet (500 mg total) by mouth daily with breakfast., Disp: 90 tablet, Rfl: 1 .  montelukast (SINGULAIR) 10 MG tablet, Take 1 tablet (10 mg total) by mouth at bedtime., Disp: 90 tablet, Rfl: 1 .  polyethylene glycol powder (GLYCOLAX/MIRALAX) powder, Take 17 g by mouth 2 (two) times daily as needed., Disp: 3350 g, Rfl: 1 .  valACYclovir (VALTREX) 500 MG tablet, Take 1 tablet (500 mg total) by mouth daily. And twice daily on outbreaks, Disp: 115 tablet, Rfl: 1  No Known Allergies   ROS  Constitutional: Negative for fever or weight change.  Respiratory: Positive for cough but no  shortness of breath.   Cardiovascular: Negative for chest pain, occasionally has  palpitations.  Gastrointestinal: Negative for abdominal pain, no bowel changes.  Musculoskeletal: Negative for gait problem or joint swelling.  Skin: Negative for rash.   Neurological: Negative for dizziness or headache.  No other specific complaints in a complete review of systems (except as listed in HPI above).  Objective  Vitals:   03/13/16 0833  BP: 132/82  Pulse: 99  Resp: 18  Temp: 98.5 F (36.9 C)  TempSrc: Oral  SpO2: 98%  Weight: 261 lb 14.4 oz (118.8 kg)  Height: 5' 6.25" (1.683 m)    Body mass index is 41.95 kg/m.  Physical Exam  Constitutional: Patient appears well-developed and well-nourished.Obese . No distress.  HENT: Head: Normocephalic and atraumatic. Ears: B TMs ok, no erythema or effusion; Nose: Nose normal. Mouth/Throat: Oropharynx is clear and  moist. No oropharyngeal exudate.  Eyes: Conjunctivae and EOM are normal. Pupils are equal, round, and reactive to light. No scleral icterus.  Neck: Normal range of motion. Neck supple. No JVD present. No thyromegaly present.  Cardiovascular: Normal rate, regular rhythm and normal heart sounds.  No murmur heard. No BLE edema. Pulmonary/Chest: Effort normal and breath sounds normal. No respiratory distress. Abdominal: Soft. Bowel sounds are normal, no distension. There is no tenderness. no masses Breast: no lumps or masses, no nipple discharge or rashes FEMALE GENITALIA:  External genitalia normal External urethra normal Vaginal vault normal without discharge or lesions Cervix normal without discharge or lesions Bimanual exam normal without masses RECTAL: not done Musculoskeletal: Normal range of motion, no joint effusions. No gross deformities Neurological: he is alert and oriented to person, place, and time. No cranial nerve deficit. Coordination, balance, strength, speech and gait are normal.  Skin: Skin is warm and dry. No rash noted.  No erythema. acanthosis nigricans, hypertrichosis on chin Psychiatric: Patient has a normal mood and affect. behavior is normal. Judgment and thought content normal.  PHQ2/9: [facility] 03/13/2016 09/16/2015 06/13/2015 11/09/2014  Decreased Interest 0 0 0 0  Down, Depressed, Hopeless 0 0 0 0  PHQ - 2 Score 0 0 0 0     Fall Risk: Fall Risk  03/13/2016 09/16/2015 06/13/2015 11/09/2014  Falls in the past year? No No No No    Functional Status Survey: Is the patient deaf or have difficulty hearing?: No Does the patient have difficulty seeing, even when wearing glasses/contacts?: No Does the patient have difficulty concentrating, remembering, or making decisions?: No Does the patient have difficulty walking or climbing stairs?: No Does the patient have difficulty dressing or bathing?: No Does the patient have difficulty doing errands alone such as  visiting a doctor's office or shopping?: No    Assessment & Plan  1. Well woman exam  Discussed importance of 150 minutes of physical activity weekly, eat two servings of fish weekly, eat one serving of tree nuts ( cashews, pistachios, pecans, almonds.Marland Kitchen.) every other day, eat 6 servings of fruit/vegetables daily and drink plenty of water and avoid sweet beverages.   2. Extreme obesity (HCC)  Discussed with the patient the risk posed by an increased BMI. Discussed importance of portion control, calorie counting and at least 150 minutes of physical activity weekly. Avoid sweet beverages and drink more water. Eat at least 6 servings of fruit and vegetables daily   3. Dysmetabolic syndrome  - Hemoglobin A1c  4. Hypertension, benign  - atenolol (TENORMIN) 25 MG tablet; TAKE 1 TABLET BY MOUTH EVERY EVENING FOR BP AND ANXIETY  Dispense: 90 tablet; Refill: 1 - COMPLETE METABOLIC PANEL WITH GFR  5. Vitamin D deficiency  - VITAMIN D 25 Hydroxy (Vit-D Deficiency, Fractures)  6. Herpes simplex vulvovaginitis  - valACYclovir (VALTREX) 500 MG tablet; Take 1 tablet (500 mg total) by mouth daily. And twice daily on outbreaks  Dispense: 115 tablet; Refill: 1  7. Metabolic syndrome  - metFORMIN (GLUCOPHAGE) 500 MG tablet; Take 1 tablet (500 mg total) by mouth daily with breakfast.  Dispense: 90 tablet; Refill: 1  8. Bilateral polycystic ovarian syndrome  - metFORMIN (GLUCOPHAGE) 500 MG tablet; Take 1 tablet (500 mg total) by mouth daily with breakfast.  Dispense: 90 tablet; Refill: 1  9. Perennial allergic rhinitis with seasonal variation  - fluticasone (FLONASE) 50 MCG/ACT nasal spray; Place 2 sprays into both nostrils at bedtime.  Dispense: 48 g; Refill: 1  10. Flexural eczema  - desoximetasone (TOPICORT) 0.25 % cream; Apply 1 application topically 2 (two) times daily.  Dispense: 100 g; Refill: 0  11. Encounter for screening for HIV  - HIV antibody  12. Lipid screening  - Lipid  panel  13. Cervical cancer screening  - Pap IG and Chlamydia/Gonococcus, NAA

## 2016-03-14 LAB — HIV ANTIBODY (ROUTINE TESTING W REFLEX): HIV 1&2 Ab, 4th Generation: NONREACTIVE

## 2016-03-14 LAB — VITAMIN D 25 HYDROXY (VIT D DEFICIENCY, FRACTURES): VIT D 25 HYDROXY: 34 ng/mL (ref 30–100)

## 2016-03-14 LAB — HEMOGLOBIN A1C
Hgb A1c MFr Bld: 5.5 % (ref <5.7)
MEAN PLASMA GLUCOSE: 111 mg/dL

## 2016-03-17 LAB — PAP IG AND CT-NG NAA
CHLAMYDIA PROBE AMP: NOT DETECTED
GC PROBE AMP: NOT DETECTED

## 2016-04-02 DIAGNOSIS — K080 Exfoliation of teeth due to systemic causes: Secondary | ICD-10-CM | POA: Diagnosis not present

## 2016-04-15 DIAGNOSIS — S161XXA Strain of muscle, fascia and tendon at neck level, initial encounter: Secondary | ICD-10-CM | POA: Diagnosis not present

## 2016-04-15 DIAGNOSIS — S39012A Strain of muscle, fascia and tendon of lower back, initial encounter: Secondary | ICD-10-CM | POA: Diagnosis not present

## 2016-04-30 ENCOUNTER — Other Ambulatory Visit: Payer: Self-pay | Admitting: Family Medicine

## 2016-04-30 NOTE — Telephone Encounter (Signed)
Patient requesting refill of [facility] to [facility].

## 2016-07-21 ENCOUNTER — Encounter: Payer: Self-pay | Admitting: Family Medicine

## 2016-07-21 ENCOUNTER — Ambulatory Visit (INDEPENDENT_AMBULATORY_CARE_PROVIDER_SITE_OTHER): Payer: Federal, State, Local not specified - PPO | Admitting: Family Medicine

## 2016-07-21 VITALS — BP 140/78 | HR 95 | Temp 98.3°F | Resp 16 | Wt 263.4 lb

## 2016-07-21 DIAGNOSIS — K582 Mixed irritable bowel syndrome: Secondary | ICD-10-CM | POA: Diagnosis not present

## 2016-07-21 DIAGNOSIS — M791 Myalgia: Secondary | ICD-10-CM

## 2016-07-21 DIAGNOSIS — M7918 Myalgia, other site: Secondary | ICD-10-CM

## 2016-07-21 DIAGNOSIS — R140 Abdominal distension (gaseous): Secondary | ICD-10-CM | POA: Diagnosis not present

## 2016-07-21 DIAGNOSIS — R1013 Epigastric pain: Secondary | ICD-10-CM | POA: Diagnosis not present

## 2016-07-21 LAB — CBC WITH DIFFERENTIAL/PLATELET
BASOS PCT: 0 %
Basophils Absolute: 0 cells/uL (ref 0–200)
Eosinophils Absolute: 170 cells/uL (ref 15–500)
Eosinophils Relative: 2 %
HEMATOCRIT: 36.6 % (ref 35.0–45.0)
Hemoglobin: 11.8 g/dL (ref 11.7–15.5)
LYMPHS PCT: 26 %
Lymphs Abs: 2210 cells/uL (ref 850–3900)
MCH: 26.3 pg — ABNORMAL LOW (ref 27.0–33.0)
MCHC: 32.2 g/dL (ref 32.0–36.0)
MCV: 81.5 fL (ref 80.0–100.0)
MONO ABS: 935 {cells}/uL (ref 200–950)
MPV: 10.7 fL (ref 7.5–12.5)
Monocytes Relative: 11 %
NEUTROS ABS: 5185 {cells}/uL (ref 1500–7800)
Neutrophils Relative %: 61 %
PLATELETS: 243 10*3/uL (ref 140–400)
RBC: 4.49 MIL/uL (ref 3.80–5.10)
RDW: 13.9 % (ref 11.0–15.0)
WBC: 8.5 10*3/uL (ref 3.8–10.8)

## 2016-07-21 MED ORDER — OMEPRAZOLE 40 MG PO CPDR: 40.0000 mg | DELAYED_RELEASE_CAPSULE | Freq: Every day | ORAL | 3 refills | Status: DC

## 2016-07-21 MED ORDER — BACLOFEN 20 MG PO TABS: 20.0000 mg | ORAL_TABLET | Freq: Three times a day (TID) | ORAL | 0 refills | Status: DC

## 2016-07-21 NOTE — Patient Instructions (Signed)
Irritable Bowel Syndrome, Adult Irritable bowel syndrome (IBS) is not one specific disease. It is a group of symptoms that affects the organs responsible for digestion (gastrointestinal or GI tract). To regulate how your GI tract works, your body sends signals back and forth between your intestines and your brain. If you have IBS, there may be a problem with these signals. As a result, your GI tract does not function normally. Your intestines may become more sensitive and overreact to certain things. This is especially true when you eat certain foods or when you are under stress. There are four types of IBS. These may be determined based on the consistency of your stool:  IBS with diarrhea.  IBS with constipation.  Mixed IBS.  Unsubtyped IBS. It is important to know which type of IBS you have. Some treatments are more likely to be helpful for certain types of IBS. What are the causes? The exact cause of IBS is not known. What increases the risk? You may have a higher risk of IBS if:  You are a woman.  You are younger than 23 years old.  You have a family history of IBS.  You have mental health problems.  You have had bacterial infection of your GI tract. What are the signs or symptoms? Symptoms of IBS vary from person to person. The main symptom is abdominal pain or discomfort. Additional symptoms usually include one or more of the following:  Diarrhea, constipation, or both.  Abdominal swelling or bloating.  Feeling full or sick after eating a small or regular-size meal.  Frequent gas.  Mucus in the stool.  A feeling of having more stool left after a bowel movement. Symptoms tend to come and go. They may be associated with stress, psychiatric conditions, or nothing at all. How is this diagnosed? There is no specific test to diagnose IBS. Your health care provider will make a diagnosis based on a physical exam, medical history, and your symptoms. You may have other tests to  rule out other conditions that may be causing your symptoms. These may include:  Blood tests.  X-rays.  CT scan.  Endoscopy and colonoscopy. This is a test in which your GI tract is viewed with a long, thin, flexible tube. How is this treated? There is no cure for IBS, but treatment can help relieve symptoms. IBS treatment often includes:  Changes to your diet, such as:  Eating more fiber.  Avoiding foods that cause symptoms.  Drinking more water.  Eating regular, medium-sized portioned meals.  Medicines. These may include:  Fiber supplements if you have constipation.  Medicine to control diarrhea (antidiarrheal medicines).  Medicine to help control muscle spasms in your GI tract (antispasmodic medicines).  Medicines to help with any mental health issues, such as antidepressants or tranquilizers.  Therapy.  Talk therapy may help with anxiety, depression, or other mental health issues that can make IBS symptoms worse.  Stress reduction.  Managing your stress can help keep symptoms under control. Follow these instructions at home:  Take medicines only as directed by your health care provider.  Eat a healthy diet.  Avoid foods and drinks with added sugar.  Include more whole grains, fruits, and vegetables gradually into your diet. This may be especially helpful if you have IBS with constipation.  Avoid any foods and drinks that make your symptoms worse. These may include dairy products and caffeinated or carbonated drinks.  Do not eat large meals.  Drink enough fluid to keep your urine   clear or pale yellow.  Exercise regularly. Ask your health care provider for recommendations of good activities for you.  Keep all follow-up visits as directed by your health care provider. This is important. Contact a health care provider if:  You have constant pain.  You have trouble or pain with swallowing.  You have worsening diarrhea. Get help right away if:  You  have severe and worsening abdominal pain.  You have diarrhea and:  You have a rash, stiff neck, or severe headache.  You are irritable, sleepy, or difficult to awaken.  You are weak, dizzy, or extremely thirsty.  You have bright red blood in your stool or you have black tarry stools.  You have unusual abdominal swelling that is painful.  You vomit continuously.  You vomit blood (hematemesis).  You have both abdominal pain and a fever. This information is not intended to replace advice given to you by your health care provider. Make sure you discuss any questions you have with your health care provider. Document Released: 03/23/2005 Document Revised: 08/23/2015 Document Reviewed: 12/08/2013 Elsevier Interactive Patient Education  2017 Elsevier Inc.  

## 2016-07-21 NOTE — Progress Notes (Signed)
Name: Rachel Dunlap   MRN: [id number]    DOB: Aug 19, 1995   Date:07/21/2016       Progress Note  Subjective  Chief Complaint  Chief Complaint  Patient presents with  . Abdominal Pain    upper for 3 weeks cramps and diarrhea more frequently    HPI  Epigastric pain: she states that over the past few weeks she has noticed intermittent epigastric pain, she is not sure how to describe it, radiates to back, usually prior to meals. Denies nausea or vomiting, but has also noticed bowel movements going from increase in frequency and loose stools to hard stools, but no blood or mucus. No fever or chills. She has been feeling very tired and stressed out. 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 She has been calling out sick because of increase in fatigue, inability to focus and mood changes  Patient Active Problem List   Diagnosis Date Noted  . Acne vulgaris 09/16/2015  . Hypertrichosis 09/16/2015  . Chronic constipation 11/09/2014  . Chronic dermatitis 11/09/2014  . Dysmetabolic syndrome 11/09/2014  . Hypertension, benign 11/09/2014  . Genital herpes in women 11/09/2014  . Mild intermittent asthma 11/09/2014  . Allergic rhinitis, seasonal 11/09/2014  . Extreme obesity 11/09/2014  . IBS (irritable bowel syndrome) 11/09/2014  . Bilateral polycystic ovarian syndrome 11/09/2014  . Abnormal presence of protein in urine 11/09/2014  . Vitamin D deficiency 11/09/2014  . Anxiety 11/09/2014    No past surgical history on file.  Family History  Problem Relation Age of Onset  . Hypertension Mother     Social History   Social History  . Marital status: Single    Spouse name: N/A  . Number of children: N/A  . Years of education: N/A   Occupational History  . Not on file.   Social History Main Topics  . Smoking status: Never  Smoker  . Smokeless tobacco: Never Used  . Alcohol use No  . Drug use: No  . Sexual activity: Not Currently    Partners: Male    Birth control/ protection: Pill   Other Topics Concern  . Not on file   Social History Narrative  . No narrative on file     Current Outpatient Prescriptions:  .  albuterol (PROVENTIL HFA;VENTOLIN HFA) 108 (90 Base) MCG/ACT inhaler, Inhale 2 puffs into the lungs every 6 (six) hours as needed for wheezing or shortness of breath., Disp: 1 Inhaler, Rfl: 0 .  APRI 0.15-30 MG-MCG tablet, 1 (ONE) TABLET, ORAL, DAILY, Disp: 84 tablet, Rfl: 0 .  atenolol (TENORMIN) 25 MG tablet, TAKE 1 TABLET BY MOUTH EVERY EVENING FOR BP AND ANXIETY, Disp: 90 tablet, Rfl: 1 .  Cholecalciferol (VITAMIN D) 2000 UNITS CAPS, Take 1 capsule by mouth daily., Disp: , Rfl:  .  desoximetasone (TOPICORT) 0.25 % cream, Apply 1 application topically 2 (two) times daily., Disp: 100 g, Rfl: 0 .  enalapril (VASOTEC) 5 MG tablet, Take 1 tablet by mouth daily., Disp: , Rfl:  .  fluticasone (FLONASE) 50 MCG/ACT nasal spray, Place 2 sprays into both nostrils at bedtime., Disp: 48 g, Rfl: 1 .  loratadine (CLARITIN) 10 MG tablet, TAKE 1 TABLET BY MOUTH DAILY, Disp: 30 tablet, Rfl: 3 .  metFORMIN (GLUCOPHAGE) 500 MG tablet, Take 1 tablet (500 mg total) by mouth daily with  breakfast., Disp: 90 tablet, Rfl: 1 .  montelukast (SINGULAIR) 10 MG tablet, Take 1 tablet (10 mg total) by mouth at bedtime., Disp: 90 tablet, Rfl: 1 .  omeprazole (PRILOSEC) 40 MG capsule, Take 1 capsule (40 mg total) by mouth daily., Disp: 30 capsule, Rfl: 3 .  polyethylene glycol powder (GLYCOLAX/MIRALAX) powder, Take 17 g by mouth 2 (two) times daily as needed., Disp: 3350 g, Rfl: 1 .  valACYclovir (VALTREX) 500 MG tablet, Take 1 tablet (500 mg total) by mouth daily. And twice daily on outbreaks, Disp: 115 tablet, Rfl: 1  No Known Allergies   ROS  Constitutional: Negative for fever or weight change.  Respiratory: Negative for  cough and shortness of breath.   Cardiovascular: Negative for chest pain or palpitations.  Gastrointestinal: Positive for abdominal pain, and  bowel changes.  Musculoskeletal: Negative for gait problem or joint swelling.  Skin: Negative for rash.  Neurological: Negative for dizziness or headache.  No other specific complaints in a complete review of systems (except as listed in HPI above).  Objective  Vitals:   07/21/16 1111  BP: 140/78  Pulse: 95  Resp: 16  Temp: 98.3 F (36.8 C)  SpO2: 97%  Weight: 263 lb 7 oz (119.5 kg)    Body mass index is 42.2 kg/m.  Physical Exam  Constitutional: Patient appears well-developed and well-nourished. Obese No distress.  HEENT: head atraumatic, normocephalic, pupils equal and reactive to light,  neck supple, throat within normal limits Cardiovascular: Normal rate, regular rhythm and normal heart sounds.  No murmur heard. No BLE edema. Pulmonary/Chest: Effort normal and breath sounds normal. No respiratory distress. Abdominal: Soft.  There is tenderness or masses, normal bowel sounds Psychiatric: Patient has a normal mood and affect. behavior is normal. Judgment and thought content normal. Muscular Skeletal: pain with lateral bending on left mid back, tender during palpation on the same area  PHQ2/9: Depression screen [facility] 2/9 03/13/2016 09/16/2015 06/13/2015 11/09/2014  Decreased Interest 0 0 0 0  Down, Depressed, Hopeless 0 0 0 0  PHQ - 2 Score 0 0 0 0     Fall Risk: Fall Risk  03/13/2016 09/16/2015 06/13/2015 11/09/2014  Falls in the past year? No No No No     Assessment & Plan  1. Epigastric pain  - H. pylori breath test - omeprazole (PRILOSEC) 40 MG capsule; Take 1 capsule (40 mg total) by mouth daily.  Dispense: 30 capsule; Refill: 3 - Lipase - CBC with Differential/Platelet  2. Bloating  - H. pylori breath test  3. Irritable bowel syndrome with both constipation and diarrhea  It may be related to increase in stress, working  70 hours a week and going to school full time Explained that she needs to back down on work and focus on school, she does not have debt, but has car and phone charges, she will try to decrease amount of work hours to decrease stress, try to monitor diet.   4. Intercostal muscle pain  - baclofen (LIORESAL) 20 MG tablet; Take 1 tablet (20 mg total) by mouth 3 (three) times daily.  Dispense: 30 each; Refill: 0

## 2016-07-22 ENCOUNTER — Telehealth: Payer: Self-pay

## 2016-07-22 LAB — LIPASE: Lipase: 26 U/L (ref 7–60)

## 2016-07-22 LAB — H. PYLORI BREATH TEST: H. PYLORI BREATH TEST: NOT DETECTED

## 2016-07-22 NOTE — Telephone Encounter (Signed)
okay

## 2016-07-22 NOTE — Telephone Encounter (Signed)
Pt called and asked for a note for work and school from 07/16/16 thru 07/22/16. I provided her with a note for her visit yesterday but she stated that she has been out of work and school since the 12th.

## 2016-07-28 ENCOUNTER — Other Ambulatory Visit: Payer: Self-pay | Admitting: Family Medicine

## 2016-07-28 DIAGNOSIS — A6000 Herpesviral infection of urogenital system, unspecified: Secondary | ICD-10-CM

## 2016-08-25 ENCOUNTER — Other Ambulatory Visit: Payer: Self-pay

## 2016-08-25 DIAGNOSIS — A6000 Herpesviral infection of urogenital system, unspecified: Secondary | ICD-10-CM

## 2016-08-25 NOTE — Telephone Encounter (Signed)
Patient [facility]  

## 2016-08-28 NOTE — Telephone Encounter (Signed)
Dr. Carlynn PurlSowles just approved 115 pills on 07/29/2016; patient should not need refill yet; please resolve with pharmacy

## 2016-08-29 ENCOUNTER — Other Ambulatory Visit: Payer: Self-pay | Admitting: Family Medicine

## 2016-08-30 NOTE — Telephone Encounter (Signed)
Problem list reviewed; no mention of DVT, PE, or migraine with aura; last well woman exam within the year; Rx approved

## 2016-09-01 NOTE — Telephone Encounter (Signed)
Spoke with pharmacist and she picked up 60 pills 07/29/16 and has 55 left from this prescription. Due to Insurance she was only able to pick up 60 at a time and patient was notified at the pharmacy.

## 2016-09-18 DIAGNOSIS — S3992XA Unspecified injury of lower back, initial encounter: Secondary | ICD-10-CM

## 2016-09-18 DIAGNOSIS — M549 Dorsalgia, unspecified: Secondary | ICD-10-CM | POA: Insufficient documentation

## 2016-09-22 ENCOUNTER — Ambulatory Visit (INDEPENDENT_AMBULATORY_CARE_PROVIDER_SITE_OTHER): Payer: Federal, State, Local not specified - PPO | Admitting: Family Medicine

## 2016-09-22 ENCOUNTER — Encounter: Payer: Self-pay | Admitting: Family Medicine

## 2016-09-22 VITALS — BP 124/78 | HR 95 | Temp 98.4°F | Resp 16 | Ht 66.0 in | Wt 262.1 lb

## 2016-09-22 DIAGNOSIS — E668 Other obesity: Secondary | ICD-10-CM

## 2016-09-22 DIAGNOSIS — J4521 Mild intermittent asthma with (acute) exacerbation: Secondary | ICD-10-CM

## 2016-09-22 DIAGNOSIS — K582 Mixed irritable bowel syndrome: Secondary | ICD-10-CM | POA: Diagnosis not present

## 2016-09-22 DIAGNOSIS — A6004 Herpesviral vulvovaginitis: Secondary | ICD-10-CM

## 2016-09-22 DIAGNOSIS — J4520 Mild intermittent asthma, uncomplicated: Secondary | ICD-10-CM

## 2016-09-22 DIAGNOSIS — I10 Essential (primary) hypertension: Secondary | ICD-10-CM | POA: Diagnosis not present

## 2016-09-22 DIAGNOSIS — R3915 Urgency of urination: Secondary | ICD-10-CM

## 2016-09-22 DIAGNOSIS — E8881 Metabolic syndrome: Secondary | ICD-10-CM

## 2016-09-22 DIAGNOSIS — E282 Polycystic ovarian syndrome: Secondary | ICD-10-CM

## 2016-09-22 MED ORDER — SEMAGLUTIDE(0.25 OR 0.5MG/DOS) 2 MG/1.5ML ~~LOC~~ SOPN: 0.5000 mg | PEN_INJECTOR | SUBCUTANEOUS | 2 refills | Status: DC

## 2016-09-22 MED ORDER — METFORMIN HCL 500 MG PO TABS: 500.0000 mg | ORAL_TABLET | Freq: Every day | ORAL | 1 refills | Status: DC

## 2016-09-22 MED ORDER — ATENOLOL 25 MG PO TABS: ORAL_TABLET | ORAL | 1 refills | Status: DC

## 2016-09-22 MED ORDER — VALACYCLOVIR HCL 500 MG PO TABS: 500.0000 mg | ORAL_TABLET | Freq: Every day | ORAL | 0 refills | Status: DC

## 2016-09-22 MED ORDER — ALBUTEROL SULFATE HFA 108 (90 BASE) MCG/ACT IN AERS: 2.0000 | INHALATION_SPRAY | Freq: Four times a day (QID) | RESPIRATORY_TRACT | 0 refills | Status: DC | PRN

## 2016-09-22 MED ORDER — LINACLOTIDE 72 MCG PO CAPS: 72.0000 ug | ORAL_CAPSULE | Freq: Every day | ORAL | 2 refills | Status: DC

## 2016-09-22 MED ORDER — NITROFURANTOIN MONOHYD MACRO 100 MG PO CAPS: 100.0000 mg | ORAL_CAPSULE | Freq: Two times a day (BID) | ORAL | 0 refills | Status: DC

## 2016-09-22 MED ORDER — MONTELUKAST SODIUM 10 MG PO TABS: 10.0000 mg | ORAL_TABLET | Freq: Every day | ORAL | 1 refills | Status: DC

## 2016-09-22 NOTE — Progress Notes (Signed)
Name: Rachel Dunlap   MRN: [id number]    DOB: April 26, 1995   Date:09/22/2016       Progress Note  Subjective  Chief Complaint  Chief Complaint  Patient presents with  . Follow-up    2 month   . Irritable Bowel Syndrome    HPI  PCOS:she has obesity, HTN, acne and hirsutism, and history of oligomenorrhea. She has been Metformin. Last hgbA1C was normal .  Asthma Mild : no symptoms of asthma for years, only has flares when she has an URI, one episode in the last year. She is currently only using rescue inhaler before activity. Denies wheezing, no SOB but has noticed a mild dry cough over the past few days.   AR: taking Loratadine, singulair daily and also Fluticasone most days to control symptoms. Usually has rhinorrhea and nasal congestion when she does not take medication  HTN: she has a history of proteinuria, sees nephrologist, Dr. Abigail Butts. Denies side effects of medication - Vasotec. No chest pain but has occasional palpitation. She is on Vasotec and Atenolol and bp is at goal   Dysmetabolism syndrome:  weight has been stable since last visit She has acanthosis nigricans. Denies polyphagia or polyuria but she feels thirsty all the time. She has not been drinking sodas but has been drinking Apple juice, advised to stop any form of sweet beverages  IBS with constipation :she states has abdominal pain, associated with constipation, she is taking Miralax prn but it does not control her pain. She has bowel movements 2 times weekly No blood or mucus in stools  Gastritis: diagnosed April, likely secondary to stress, did  well on Omeprazole, she finished medication, stress is down  Obesity: she has joined [employer] for the past year, she is also meal planning - started last week, avoiding sodas but has been drinking apple juice. Discussed life style modification and also medications, she is willing to try Ozempic, she denies family history of thyroid cancer or personal  history of pancreatitis  Genital Herpes: not currently sexually active, no episodes since first outbreak in 2016 . She is still very upset about having herpes, and the fact it was from oral sex and she never had intercourse. She is afraid of having a relationship with anyone else. No episodes since Dec when she started Valtrex  Patient Active Problem List   Diagnosis Date Noted  . Back pain due to injury 09/18/2016  . Acne vulgaris 09/16/2015  . Hypertrichosis 09/16/2015  . Chronic constipation 11/09/2014  . Chronic dermatitis 11/09/2014  . Dysmetabolic syndrome 37/01/6268  . Hypertension, benign 11/09/2014  . Genital herpes in women 11/09/2014  . Mild intermittent asthma 11/09/2014  . Allergic rhinitis, seasonal 11/09/2014  . Extreme obesity 11/09/2014  . IBS (irritable bowel syndrome) 11/09/2014  . Bilateral polycystic ovarian syndrome 11/09/2014  . Abnormal presence of protein in urine 11/09/2014  . Vitamin D deficiency 11/09/2014  . Anxiety 11/09/2014    History reviewed. No pertinent surgical history.  Family History  Problem Relation Age of Onset  . Hypertension Mother     Social History   Social History  . Marital status: Single    Spouse name: N/A  . Number of children: N/A  . Years of education: N/A   Occupational History  . Not on file.   Social History Main Topics  . Smoking status: Never Smoker  . Smokeless tobacco: Never Used  . Alcohol use No  . Drug use: No  . Sexual activity:  Not Currently    Partners: Male    Birth control/ protection: Pill   Other Topics Concern  . 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  traMADol (ULTRAM) 50 MG tablet, Take by mouth every 6 (six) hours as needed., Disp: , Rfl:  .  albuterol (PROVENTIL HFA;VENTOLIN HFA) 108 (90  Base) MCG/ACT inhaler, Inhale 2 puffs into the lungs every 6 (six) hours as needed for wheezing or shortness of breath., Disp: 1 Inhaler, Rfl: 0 .  APRI 0.15-30 MG-MCG tablet, 1 (ONE) TABLET, ORAL, DAILY, Disp: 84 tablet, Rfl: 0 .  atenolol (TENORMIN) 25 MG tablet, TAKE 1 TABLET BY MOUTH EVERY EVENING FOR BP AND ANXIETY, Disp: 90 tablet, Rfl: 1 .  Cholecalciferol (VITAMIN D) 2000 UNITS CAPS, Take 1 capsule by mouth daily., Disp: , Rfl:  .  desoximetasone (TOPICORT) 0.25 % cream, Apply 1 application topically 2 (two) times daily., Disp: 100 g, Rfl: 0 .  enalapril (VASOTEC) 5 MG tablet, Take 1 tablet by mouth daily., Disp: , Rfl:  .  fluticasone (FLONASE) 50 MCG/ACT nasal spray, Place 2 sprays into both nostrils at bedtime., Disp: 48 g, Rfl: 1 .  ibuprofen (ADVIL,MOTRIN) 800 MG tablet, Take 800 mg by mouth 3 (three) times daily., Disp: , Rfl: 0 .  loratadine (CLARITIN) 10 MG tablet, TAKE 1 TABLET BY MOUTH DAILY, Disp: 30 tablet, Rfl: 3 .  metFORMIN (GLUCOPHAGE) 500 MG tablet, Take 1 tablet (500 mg total) by mouth daily with breakfast., Disp: 90 tablet, Rfl: 1 .  methocarbamol (ROBAXIN) 750 MG tablet, TAKE 1 TABLET BY MOUTH 3 TIMES A DAY AS NEEDED FOR MUSCLE TIGHTNESS AND SPASMS, Disp: , Rfl: 0 .  montelukast (SINGULAIR) 10 MG tablet, Take 1 tablet (10 mg total) by mouth at bedtime., Disp: 90 tablet, Rfl: 1 .  polyethylene glycol powder (GLYCOLAX/MIRALAX) powder, Take 17 g by mouth 2 (two) times daily as needed., Disp: 3350 g, Rfl: 1 .  valACYclovir (VALTREX) 500 MG tablet, Take 1 tablet (500 mg total) by mouth daily. And twice daily on outbreaks, Disp: 115 tablet, Rfl: 0  No Known Allergies   ROS  Constitutional: Negative for fever or weight change.  Respiratory: Negative for cough and shortness of breath.   Cardiovascular: Negative for chest pain or palpitations.  Gastrointestinal: Negative for abdominal pain, no bowel changes.  Musculoskeletal: Negative for gait problem or joint swelling.   Skin: Negative for rash.  Neurological: Negative for dizziness or headache.  No other specific complaints in a complete review of systems (except as listed in HPI above).  Objective  Vitals:   09/22/16 1147  BP: 124/78  Pulse: 95  Resp: 16  Temp: 98.4 F (36.9 C)  SpO2: 97%  Weight: 262 lb 1 oz (118.9 kg)  Height: 5' 6" (1.676 m)    Body mass index is 42.3 kg/m.  Physical Exam  Constitutional: Patient appears well-developed and well-nourished. Obese  No distress.  HEENT: head atraumatic, normocephalic, pupils equal and reactive to light, neck supple, throat within normal limits Cardiovascular: Normal rate, regular rhythm and normal heart sounds.  No murmur heard. No BLE edema. Pulmonary/Chest: Effort normal and breath sounds normal. No respiratory distress. Abdominal: Soft.  There is no tenderness. Skin: hypertrichosis.  Psychiatric: Patient has a  normal mood and affect. behavior is normal. Judgment and thought content normal.  Recent Results (from the past 2160 hour(s))  H. pylori breath test     Status: None   Collection Time: 07/21/16 11:46 AM  Result Value Ref Range   H. pylori Breath Test NOT DETECTED Not Detected    Comment:   Antimicrobials, proton pump inhibitors, and bismuth preparations are known to suppress H. pylori, and ingestion of these prior to H. pylori diagnostic testing may lead to false negative results. If clinically indicated, the test may be repeated on a new specimen obtained two weeks after discontinuing treatment.     Lipase     Status: None   Collection Time: 07/21/16 12:15 PM  Result Value Ref Range   Lipase 26 7 - 60 U/L  CBC with Differential/Platelet     Status: Abnormal   Collection Time: 07/21/16 12:15 PM  Result Value Ref Range   WBC 8.5 3.8 - 10.8 K/uL   RBC 4.49 3.80 - 5.10 MIL/uL   Hemoglobin 11.8 11.7 - 15.5 g/dL   HCT 36.6 35.0 - 45.0 %   MCV 81.5 80.0 - 100.0 fL   MCH 26.3 (L) 27.0 - 33.0 pg   MCHC 32.2 32.0 - 36.0  g/dL   RDW 13.9 11.0 - 15.0 %   Platelets 243 140 - 400 K/uL   MPV 10.7 7.5 - 12.5 fL   Neutro Abs 5,185 1,500 - 7,800 cells/uL   Lymphs Abs 2,210 850 - 3,900 cells/uL   Monocytes Absolute 935 200 - 950 cells/uL   Eosinophils Absolute 170 15 - 500 cells/uL   Basophils Absolute 0 0 - 200 cells/uL   Neutrophils Relative % 61 %   Lymphocytes Relative 26 %   Monocytes Relative 11 %   Eosinophils Relative 2 %   Basophils Relative 0 %   Smear Review Criteria for review not met       PHQ2/9: Depression screen [facility] 2/9 03/13/2016 09/16/2015 06/13/2015 11/09/2014  Decreased Interest 0 0 0 0  Down, Depressed, Hopeless 0 0 0 0  PHQ - 2 Score 0 0 0 0     Fall Risk: Fall Risk  09/22/2016 03/13/2016 09/16/2015 06/13/2015 11/09/2014  Falls in the past year? _0      Functional Status Survey: Is the patient deaf or have difficulty hearing?: No Does the patient have difficulty seeing, even when wearing glasses/contacts?: No Does the patient have difficulty concentrating, remembering, or making decisions?: No Does the patient have difficulty walking or climbing stairs?: No Does the patient have difficulty dressing or bathing?: No Does the patient have difficulty doing errands alone such as visiting a doctor's office or shopping?: No   Assessment & Plan  1. Hypertension, benign  - atenolol (TENORMIN) 25 MG tablet; TAKE 1 TABLET BY MOUTH EVERY EVENING FOR BP AND ANXIETY  Dispense: 90 tablet; Refill: 1 - COMPLETE METABOLIC PANEL WITH GFR Also on ACE, bp is at goal, continue follow up with Dr. Abigail Butts yearly   2. Irritable bowel syndrome with both constipation and diarrhea  She states she has more constipation than diarrhea We will try Linzess, discussed possible side effects  3. Extreme obesity  Discussed all optiosns for weight loss medications including Belviq, Qsymia, Saxenda and Contrave. Discussed risk and benefits of each of them.Since she has pre-diabetes and insulin resistance  we will try Ozempic - Semaglutide (OZEMPIC) 0.25 or 0.5 MG/DOSE SOPN; Inject 0.5 mg into the skin once a week.  Dispense: 2 pen; Refill: 2  4. Herpes simplex vulvovaginitis  - valACYclovir (VALTREX) 500 MG tablet; Take 1 tablet (500 mg total) by mouth daily. And twice daily on outbreaks  Dispense: 115 tablet; Refill: 0  5. Mild intermittent asthma, uncomplicated  - montelukast (SINGULAIR) 10 MG tablet; Take 1 tablet (10 mg total) by mouth at bedtime.  Dispense: 90 tablet; Refill: 1  6. Metabolic syndrome  - Insulin, fasting - metFORMIN (GLUCOPHAGE) 500 MG tablet; Take 1 tablet (500 mg total) by mouth daily with breakfast.  Dispense: 90 tablet; Refill: 1 - Semaglutide (OZEMPIC) 0.25 or 0.5 MG/DOSE SOPN; Inject 0.5 mg into the skin once a week.  Dispense: 2 pen; Refill: 2  7. Bilateral polycystic ovarian syndrome  - metFORMIN (GLUCOPHAGE) 500 MG tablet; Take 1 tablet (500 mg total) by mouth daily with breakfast.  Dispense: 90 tablet; Refill: 1  8. Mild intermittent asthma with acute exacerbation  - albuterol (PROVENTIL HFA;VENTOLIN HFA) 108 (90 Base) MCG/ACT inhaler; Inhale 2 puffs into the lungs every 6 (six) hours as needed for wheezing or shortness of breath.  Dispense: 1 Inhaler; Refill: 0 She uses it prior to activity   9. Urinary urgency  - Urine Culture - nitrofurantoin, macrocrystal-monohydrate, (MACROBID) 100 MG capsule; Take 1 capsule (100 mg total) by mouth 2 (two) times daily.  Dispense: 10 capsule; Refill: 0

## 2016-11-05 ENCOUNTER — Telehealth: Payer: Self-pay

## 2016-11-05 NOTE — Telephone Encounter (Signed)
Patient is still on mother's health Insurance and states the Ozempic is going to cost her $300 a month. Informed patient to call her Insurance and ask which medications would be covered under her insurance plan and call us back with a list of them.

## 2016-11-12 DIAGNOSIS — R809 Proteinuria, unspecified: Secondary | ICD-10-CM | POA: Diagnosis not present

## 2016-11-12 DIAGNOSIS — I10 Essential (primary) hypertension: Secondary | ICD-10-CM | POA: Diagnosis not present

## 2016-11-12 DIAGNOSIS — N390 Urinary tract infection, site not specified: Secondary | ICD-10-CM | POA: Diagnosis not present

## 2016-11-13 DIAGNOSIS — R809 Proteinuria, unspecified: Secondary | ICD-10-CM | POA: Diagnosis not present

## 2016-11-13 DIAGNOSIS — N390 Urinary tract infection, site not specified: Secondary | ICD-10-CM | POA: Diagnosis not present

## 2016-11-22 ENCOUNTER — Other Ambulatory Visit: Payer: Self-pay | Admitting: Family Medicine

## 2016-12-25 ENCOUNTER — Ambulatory Visit: Payer: Federal, State, Local not specified - PPO | Admitting: Family Medicine

## 2016-12-31 ENCOUNTER — Other Ambulatory Visit: Payer: Self-pay | Admitting: Family Medicine

## 2016-12-31 DIAGNOSIS — I10 Essential (primary) hypertension: Secondary | ICD-10-CM

## 2017-01-05 ENCOUNTER — Ambulatory Visit: Payer: Federal, State, Local not specified - PPO | Admitting: Family Medicine

## 2017-01-25 ENCOUNTER — Other Ambulatory Visit: Payer: Self-pay | Admitting: Family Medicine

## 2017-01-25 DIAGNOSIS — A6000 Herpesviral infection of urogenital system, unspecified: Secondary | ICD-10-CM

## 2017-02-16 ENCOUNTER — Encounter: Payer: Self-pay | Admitting: Family Medicine

## 2017-02-16 ENCOUNTER — Ambulatory Visit: Payer: Federal, State, Local not specified - PPO | Admitting: Family Medicine

## 2017-02-16 VITALS — BP 130/80 | HR 77 | Resp 14 | Ht 66.0 in | Wt 252.9 lb

## 2017-02-16 DIAGNOSIS — E8881 Metabolic syndrome: Secondary | ICD-10-CM

## 2017-02-16 DIAGNOSIS — F331 Major depressive disorder, recurrent, moderate: Secondary | ICD-10-CM | POA: Diagnosis not present

## 2017-02-16 DIAGNOSIS — F4321 Adjustment disorder with depressed mood: Secondary | ICD-10-CM | POA: Diagnosis not present

## 2017-02-16 DIAGNOSIS — E668 Other obesity: Secondary | ICD-10-CM

## 2017-02-16 DIAGNOSIS — I10 Essential (primary) hypertension: Secondary | ICD-10-CM

## 2017-02-16 DIAGNOSIS — K582 Mixed irritable bowel syndrome: Secondary | ICD-10-CM | POA: Diagnosis not present

## 2017-02-16 DIAGNOSIS — L2082 Flexural eczema: Secondary | ICD-10-CM

## 2017-02-16 DIAGNOSIS — J4520 Mild intermittent asthma, uncomplicated: Secondary | ICD-10-CM | POA: Diagnosis not present

## 2017-02-16 DIAGNOSIS — Z23 Encounter for immunization: Secondary | ICD-10-CM | POA: Diagnosis not present

## 2017-02-16 DIAGNOSIS — E282 Polycystic ovarian syndrome: Secondary | ICD-10-CM

## 2017-02-16 MED ORDER — SEMAGLUTIDE(0.25 OR 0.5MG/DOS) 2 MG/1.5ML ~~LOC~~ SOPN: 0.5000 mg | PEN_INJECTOR | SUBCUTANEOUS | 1 refills | Status: DC

## 2017-02-16 MED ORDER — MONTELUKAST SODIUM 10 MG PO TABS: 10.0000 mg | ORAL_TABLET | Freq: Every day | ORAL | 1 refills | Status: DC

## 2017-02-16 MED ORDER — BUPROPION HCL ER (XL) 150 MG PO TB24: 150.0000 mg | ORAL_TABLET | Freq: Every day | ORAL | 0 refills | Status: DC

## 2017-02-16 MED ORDER — DESOXIMETASONE 0.25 % EX CREA: 1.0000 "application " | TOPICAL_CREAM | Freq: Two times a day (BID) | CUTANEOUS | 0 refills | Status: DC

## 2017-02-16 MED ORDER — METFORMIN HCL 500 MG PO TABS: 500.0000 mg | ORAL_TABLET | Freq: Every day | ORAL | 1 refills | Status: DC

## 2017-02-16 MED ORDER — LINACLOTIDE 72 MCG PO CAPS: 72.0000 ug | ORAL_CAPSULE | Freq: Every day | ORAL | 1 refills | Status: DC

## 2017-02-16 MED ORDER — ATENOLOL 25 MG PO TABS: ORAL_TABLET | ORAL | 1 refills | Status: DC

## 2017-02-16 NOTE — Addendum Note (Signed)
Addended by: Tommie RaymondBOOKER, CRYSTAL L on: 02/16/2017 02:52 PM   Modules accepted: Orders

## 2017-02-16 NOTE — Progress Notes (Signed)
Name: Rachel Dunlap   MRN: [id number]    DOB: 09/15/1995   Date:02/16/2017       Progress Note  Subjective  Chief Complaint  Chief Complaint  Patient presents with  . Hypertension  . Constipation  . Obesity    HPI  PCOS:she has obesity, HTN, acne and hirsutism, and history of oligomenorrhea. She has been Metformin but did not like side effects, she is now on Ozempic and has lost weight since last visit. . Last hgbA1C was normal .  HTN: she has a history of proteinuria, sees nephrologist, Dr. Ronn MelenaKolloru. Denies side effects of medication - Vasotec. No chest pain but has occasional palpitation. She is on Vasotec and Atenolol and bp is at goal . Denies side effects of medication and is aware of risk of teratogenicity with ace.   Dysmetabolism syndrome: she has lost 10 lbs since started on Ozempic 3 months ago. t She has acanthosis nigricans. Denies polyphagia or polyuria but she feels thirsty all the time. She has not been drinking sodas and is not drinking juice, drinking more water.   IBS with constipation :she states has abdominal pain, associated with constipation, she iis now on Linzess, but at most one bowel movement per week, she is off Miralax, she is only taking linzess prn, and explained that needs to take it daily.  Obesity: she has joined [employer] for the past year, she is also meal planning , on Ozempic and is doing well. Starting weight was 262 lbs, June 2018  Genital Herpes: not currently sexually active, no episodes since first outbreak in 2016 .No episodes since Dec 2017  Depression Major/grieving: she is very sad since her father died 12/2016, she has no energy, sleeping more than usual, wanted to quit school, getting a little better, however still struggles with the fact that she has herpes, and has self esteem problems, explained that she needs to star counseling, discussed medications and she is willing to try Wellbutrin.    Patient Active Problem  List   Diagnosis Date Noted  . Back pain due to injury 09/18/2016  . Acne vulgaris 09/16/2015  . Hypertrichosis 09/16/2015  . Chronic constipation 11/09/2014  . Chronic dermatitis 11/09/2014  . Dysmetabolic syndrome 11/09/2014  . Hypertension, benign 11/09/2014  . Genital herpes in women 11/09/2014  . Mild intermittent asthma 11/09/2014  . Allergic rhinitis, seasonal 11/09/2014  . Extreme obesity 11/09/2014  . IBS (irritable bowel syndrome) 11/09/2014  . Bilateral polycystic ovarian syndrome 11/09/2014  . Abnormal presence of protein in urine 11/09/2014  . Vitamin D deficiency 11/09/2014  . Anxiety 11/09/2014    History reviewed. No pertinent surgical history.  Family History  Problem Relation Age of Onset  . Hypertension Mother   . Cancer Father   . Obesity Maternal Grandmother     Social History   Socioeconomic History  . Marital status: Single    Spouse name: Not on file  . Number of children: Not on file  . Years of education: Not on file  . Highest education level: Not on file  Social Needs  . Financial resource strain: Not on file  . Food insecurity - worry: Not on file  . Food insecurity - inability: Not on file  . Transportation needs - medical: Not on file  . Transportation needs - non-medical: Not on file  Occupational History  . Occupation: gas station attendant.     Comment: [employer]  Tobacco Use  . Smoking status: Never Smoker  .  Smokeless tobacco: Never Used  Substance and Sexual Activity  . Alcohol use: No    Alcohol/week: 0.0 oz  . Drug use: No  . Sexual activity: Not Currently    Partners: Male    Birth control/protection: Pill  Other Topics Concern  . 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  albuterol (PROVENTIL HFA;VENTOLIN HFA) 108 (90 Base) MCG/ACT  inhaler, Inhale 2 puffs into the lungs every 6 (six) hours as needed for wheezing or shortness of breath., Disp: 1 Inhaler, Rfl: 0 .  APRI 0.15-30 MG-MCG tablet, TAKE 1 TABLET BY MOUTH EVERY DAY, Disp: 84 tablet, Rfl: 0 .  atenolol (TENORMIN) 25 MG tablet, TAKE 1 TABLET BY MOUTH EVERY EVENING FOR BP AND ANXIETY, Disp: 90 tablet, Rfl: 1 .  Cholecalciferol (VITAMIN D) 2000 UNITS CAPS, Take 1 capsule by mouth daily., Disp: , Rfl:  .  desoximetasone (TOPICORT) 0.25 % cream, Apply 1 application 2 (two) times daily topically., Disp: 100 g, Rfl: 0 .  enalapril (VASOTEC) 5 MG tablet, Take 1 tablet by mouth daily., Disp: , Rfl:  .  fluticasone (FLONASE) 50 MCG/ACT nasal spray, Place 2 sprays into both nostrils at bedtime., Disp: 48 g, Rfl: 1 .  ibuprofen (ADVIL,MOTRIN) 800 MG tablet, Take 800 mg by mouth 3 (three) times daily., Disp: , Rfl: 0 .  linaclotide (LINZESS) 72 MCG capsule, Take 1 capsule (72 mcg total) daily before breakfast by mouth., Disp: 90 capsule, Rfl: 1 .  loratadine (CLARITIN) 10 MG tablet, TAKE 1 TABLET BY MOUTH DAILY, Disp: 30 tablet, Rfl: 3 .  metFORMIN (GLUCOPHAGE) 500 MG tablet, Take 1 tablet (500 mg total) daily with breakfast by mouth., Disp: 90 tablet, Rfl: 1 .  montelukast (SINGULAIR) 10 MG tablet, Take 1 tablet (10 mg total) at bedtime by mouth., Disp: 90 tablet, Rfl: 1 .  Semaglutide (OZEMPIC) 0.25 or 0.5 MG/DOSE SOPN, Inject 0.5 mg once a week into the skin., Disp: 6 pen, Rfl: 1 .  valACYclovir (VALTREX) 500 MG tablet, TAKE 1 TABLET (500 MG TOTAL) BY MOUTH DAILY. AND TWICE DAILY ON OUTBREAKS, Disp: 115 tablet, Rfl: 0  No Known Allergies   ROS  Constitutional: Negative for fever or weight change.  Respiratory: Negative for cough and shortness of breath.   Cardiovascular: Negative for chest pain or palpitations.  Gastrointestinal: Negative for abdominal pain, no bowel changes.  Musculoskeletal: Negative for gait problem or joint swelling.  Skin: Negative for rash.   Neurological: Negative for dizziness or headache.  No other specific complaints in a complete review of systems (except as listed in HPI above).  Objective  Vitals:   02/16/17 1352  BP: 130/80  Pulse: 77  Resp: 14  SpO2: 98%  Weight: 252 lb 14.4 oz (114.7 kg)  Height: 5\' 6"  (1.676 m)    Body mass index is 40.82 kg/m.  Physical Exam  Constitutional: Patient appears well-developed and well-nourished. Obese  No distress.  HEENT: head atraumatic, normocephalic, pupils equal and reactive to light, neck supple, throat within normal limits Cardiovascular: Normal rate, regular rhythm and normal heart sounds.  No murmur heard. No BLE edema. Pulmonary/Chest: Effort normal and breath sounds normal. No respiratory distress. Abdominal: Soft.  There is no tenderness. Rash: no rash at this time, but has recurrence  Psychiatric: Patient has a normal mood and affect. behavior is normal. Judgment and thought content normal.   PHQ2/9: Depression screen [facility] 2/9 02/16/2017 03/13/2016 09/16/2015 06/13/2015 11/09/2014  Decreased Interest 3 0 0 0 0  Down, Depressed, Hopeless 2 0 0 0 0  PHQ - 2 Score 5 0 0 0 0  Altered sleeping 3 - - - -  Tired, decreased energy 3 - - - -  Change in appetite 3 - - - -  Feeling bad or failure about yourself  2 - - - -  Trouble concentrating 3 - - - -  Moving slowly or fidgety/restless 0 - - - -  Suicidal thoughts 0 - - - -  PHQ-9 Score 19 - - - -  Difficult doing work/chores Very difficult - - - -     Fall Risk: Fall Risk  02/16/2017 09/22/2016 03/13/2016 09/16/2015 06/13/2015  Falls in the past year? No No No No No     Assessment & Plan   1. Metabolic syndrome  - metFORMIN (GLUCOPHAGE) 500 MG tablet; Take 1 tablet (500 mg total) daily with breakfast by mouth.  Dispense: 90 tablet; Refill: 1 - Semaglutide (OZEMPIC) 0.25 or 0.5 MG/DOSE SOPN; Inject 0.5 mg once a week into the skin.  Dispense: 6 pen; Refill: 1  2. Bilateral polycystic ovarian syndrome  -  metFORMIN (GLUCOPHAGE) 500 MG tablet; Take 1 tablet (500 mg total) daily with breakfast by mouth.  Dispense: 90 tablet; Refill: 1  3. Hypertension, benign  - atenolol (TENORMIN) 25 MG tablet; TAKE 1 TABLET BY MOUTH EVERY EVENING FOR BP AND ANXIETY  Dispense: 90 tablet; Refill: 1  4. Flexural eczema  - desoximetasone (TOPICORT) 0.25 % cream; Apply 1 application 2 (two) times daily topically.  Dispense: 100 g; Refill: 0  5. Irritable bowel syndrome with both constipation and diarrhea  - linaclotide (LINZESS) 72 MCG capsule; Take 1 capsule (72 mcg total) daily before breakfast by mouth.  Dispense: 90 capsule; Refill: 1  6. Mild intermittent asthma, uncomplicated  - montelukast (SINGULAIR) 10 MG tablet; Take 1 tablet (10 mg total) at bedtime by mouth.  Dispense: 90 tablet; Refill: 1  7. Extreme obesity  - Semaglutide (OZEMPIC) 0.25 or 0.5 MG/DOSE SOPN; Inject 0.5 mg once a week into the skin.  Dispense: 6 pen; Refill: 1  8. Grieving   9. Moderate recurrent major depression (HCC)  - buPROPion (WELLBUTRIN XL) 150 MG 24 hr tablet; Take 1 tablet (150 mg total) daily by mouth.  Dispense: 90 tablet; Refill: 0  We will check labs also

## 2017-02-18 ENCOUNTER — Other Ambulatory Visit: Payer: Self-pay | Admitting: Family Medicine

## 2017-02-20 ENCOUNTER — Other Ambulatory Visit: Payer: Self-pay | Admitting: Family Medicine

## 2017-02-20 DIAGNOSIS — A6000 Herpesviral infection of urogenital system, unspecified: Secondary | ICD-10-CM

## 2017-03-16 ENCOUNTER — Encounter: Payer: Federal, State, Local not specified - PPO | Admitting: Family Medicine

## 2017-03-20 ENCOUNTER — Encounter: Payer: Self-pay | Admitting: Family Medicine

## 2017-03-20 ENCOUNTER — Ambulatory Visit (INDEPENDENT_AMBULATORY_CARE_PROVIDER_SITE_OTHER): Payer: Federal, State, Local not specified - PPO | Admitting: Family Medicine

## 2017-03-20 VITALS — BP 120/70 | HR 94 | Temp 98.5°F | Resp 12 | Ht 67.5 in | Wt 251.8 lb

## 2017-03-20 DIAGNOSIS — Z01419 Encounter for gynecological examination (general) (routine) without abnormal findings: Secondary | ICD-10-CM | POA: Diagnosis not present

## 2017-03-20 DIAGNOSIS — Z23 Encounter for immunization: Secondary | ICD-10-CM

## 2017-03-20 DIAGNOSIS — Z113 Encounter for screening for infections with a predominantly sexual mode of transmission: Secondary | ICD-10-CM | POA: Diagnosis not present

## 2017-03-20 NOTE — Progress Notes (Signed)
Name: Rachel Dunlap   MRN: [id number]    DOB: 26-Sep-1995   Date:03/20/2017       Progress Note  Subjective  Chief Complaint  Chief Complaint  Patient presents with  . Annual Exam    HPI   Patient presents for annual CPE   Diet: planning her meals, avoiding eating out, working on weight loss and healthy living Exercise: she will increase    USPSTF grade A and B recommendations  Depression:  Depression screen [facility] 2/9 03/20/2017 02/16/2017 03/13/2016 09/16/2015 06/13/2015  Decreased Interest 0 3 0 0 0  Down, Depressed, Hopeless 0 2 0 0 0  PHQ - 2 Score 0 5 0 0 0  Altered sleeping - 3 - - -  Tired, decreased energy - 3 - - -  Change in appetite - 3 - - -  Feeling bad or failure about yourself  - 2 - - -  Trouble concentrating - 3 - - -  Moving slowly or fidgety/restless - 0 - - -  Suicidal thoughts - 0 - - -  PHQ-9 Score - 19 - - -  Difficult doing work/chores - Very difficult - - -   Hypertension: BP Readings from Last 3 Encounters:  03/20/17 120/70  02/16/17 130/80  09/22/16 124/78   Obesity: Wt Readings from Last 3 Encounters:  03/20/17 251 lb 12.8 oz (114.2 kg)  02/16/17 252 lb 14.4 oz (114.7 kg)  09/22/16 262 lb 1 oz (118.9 kg)   BMI Readings from Last 3 Encounters:  03/20/17 38.86 kg/m  02/16/17 40.82 kg/m  09/22/16 42.30 kg/m     STD testing and prevention (chl/gon/syphilis): today, but not sexually since last pap and we will not repeat today  Intimate partner violence:negative  Sexual History/Pain during Intercourse: first intercourse at age 11, only one sexual partner, no pain during intercourse. Never had anal sex Menstrual History/LMP/Abnormal Bleeding:Menarche at age 7, cycles are only regulated by ocp, otherwise has oligomenorrhea. Not painful Incontinence Symptoms: no symptoms   Advanced Care Planning: A voluntary discussion about advance care planning including the explanation and discussion of advance directives.  Discussed health care proxy  and Living will, and the patient was able to identify a health care proxy as mother.  Patient does not have a living will at present time. If patient does have living will, I have requested they bring this to the clinic to be scanned in to their chart.   Cervical cancer screening: 2017, no intercourse since last year   Lipids:  Lab Results  Component Value Date   CHOL 164 03/13/2016   Lab Results  Component Value Date   HDL 60 03/13/2016   Lab Results  Component Value Date   LDLCALC 84 03/13/2016   Lab Results  Component Value Date   TRIG 100 03/13/2016   Lab Results  Component Value Date   CHOLHDL 2.7 03/13/2016   No results found for: LDLDIRECT  Glucose:  Glucose, Bld  Date Value Ref Range Status  03/13/2016 89 65 - 99 mg/dL Final     Patient Active Problem List   Diagnosis Date Noted  . Moderate recurrent major depression (HCC) 02/16/2017  . Back pain due to injury 09/18/2016  . Acne vulgaris 09/16/2015  . Hypertrichosis 09/16/2015  . Chronic constipation 11/09/2014  . Chronic dermatitis 11/09/2014  . Dysmetabolic syndrome 11/09/2014  . Hypertension, benign 11/09/2014  . Genital herpes in women 11/09/2014  . Mild intermittent asthma 11/09/2014  . Allergic rhinitis, seasonal 11/09/2014  . Extreme  obesity 11/09/2014  . IBS (irritable bowel syndrome) 11/09/2014  . Bilateral polycystic ovarian syndrome 11/09/2014  . Abnormal presence of protein in urine 11/09/2014  . Vitamin D deficiency 11/09/2014  . Anxiety 11/09/2014    History reviewed. No pertinent surgical history.  Family History  Problem Relation Age of Onset  . Hypertension Mother   . Cancer Father   . Obesity Maternal Grandmother     Social History   Socioeconomic History  . Marital status: Single    Spouse name: Not on file  . Number of children: 0  . Years of education: Not on file  . Highest education level: Some college, no degree  Social Needs  . Financial resource strain:  Somewhat hard  . Food insecurity - worry: Never true  . Food insecurity - inability: Never true  . Transportation needs - medical: No  . Transportation needs - non-medical: No  Occupational History  . Occupation: gas station attendant.     Comment: [employer]  Tobacco Use  . Smoking status: Never Smoker  . Smokeless tobacco: Never Used  Substance and Sexual Activity  . Alcohol use: Yes    Alcohol/week: 0.0 oz    Comment: seldom  . Drug use: No  . Sexual activity: Not Currently    Partners: Male    Birth control/protection: Pill, Abstinence  Other Topics Concern  . 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  albuterol (PROVENTIL HFA;VENTOLIN HFA) 108 (90 Base) MCG/ACT inhaler, Inhale 2 puffs into the lungs every 6 (six) hours as needed for wheezing or shortness of breath., Disp: 1 Inhaler, Rfl: 0 .  APRI 0.15-30 MG-MCG tablet, TAKE 1 TABLET BY MOUTH EVERY DAY, Disp: 84 tablet, Rfl: 0 .  atenolol (TENORMIN) 25 MG tablet, TAKE 1 TABLET BY MOUTH EVERY EVENING FOR BP AND ANXIETY, Disp: 90 tablet, Rfl: 1 .  buPROPion (WELLBUTRIN XL) 150 MG 24 hr tablet, Take 1 tablet (150 mg total) daily by mouth., Disp: 90 tablet, Rfl: 0 .  Cholecalciferol (VITAMIN D) 2000 UNITS CAPS, Take 1 capsule by mouth daily., Disp: , Rfl:  .  desoximetasone (TOPICORT) 0.25 % cream, Apply 1 application 2 (two) times daily topically., Disp: 100 g, Rfl: 0 .  enalapril (VASOTEC) 5 MG tablet, Take 1 tablet by mouth daily., Disp: , Rfl:  .  fluticasone (FLONASE) 50 MCG/ACT nasal spray, Place 2 sprays into both nostrils at bedtime., Disp: 48 g, Rfl: 1 .  ibuprofen (ADVIL,MOTRIN) 800 MG tablet, Take 800 mg by mouth 3 (three) times daily., Disp: , Rfl: 0 .  linaclotide (LINZESS) 72 MCG capsule, Take 1  capsule (72 mcg total) daily before breakfast by mouth., Disp: 90 capsule, Rfl: 1 .  loratadine (CLARITIN) 10 MG tablet, TAKE 1 TABLET BY MOUTH DAILY, Disp: 30 tablet, Rfl: 3 .  metFORMIN (GLUCOPHAGE) 500 MG tablet, Take 1 tablet (500 mg total) daily with breakfast by mouth., Disp: 90 tablet, Rfl: 1 .  montelukast (SINGULAIR) 10 MG tablet, Take 1 tablet (10 mg total) at bedtime by mouth., Disp: 90 tablet, Rfl: 1 .  Semaglutide (OZEMPIC) 0.25 or 0.5 MG/DOSE SOPN, Inject 0.5 mg once a week into the skin., Disp: 6 pen, Rfl: 1 .  valACYclovir (  VALTREX) 500 MG tablet, TAKE 1 TABLET (500 MG TOTAL) BY MOUTH DAILY. AND TWICE DAILY ON OUTBREAKS, Disp: 115 tablet, Rfl: 0  No Known Allergies   ROS   Constitutional: Negative for fever or weight change.  Respiratory: Negative for cough and shortness of breath.   Cardiovascular: Negative for chest pain or palpitations.  Gastrointestinal: Negative for abdominal pain, no bowel changes.  Musculoskeletal: Negative for gait problem or joint swelling.  Skin: Negative for rash.  Neurological: Negative for dizziness or headache.  No other specific complaints in a complete review of systems (except as listed in HPI above).   Objective  Vitals:   03/20/17 1207  BP: 120/70  Pulse: 94  Resp: 12  Temp: 98.5 F (36.9 C)  TempSrc: Oral  SpO2: 98%  Weight: 251 lb 12.8 oz (114.2 kg)  Height: 5' 7.5" (1.715 m)    Body mass index is 38.86 kg/m.  Physical Exam  Constitutional: Patient appears well-developed and obese. No distress.  HENT: Head: Normocephalic and atraumatic. Ears: B TMs ok, no erythema or effusion; Nose: Nose normal. Mouth/Throat: Oropharynx is clear and moist. No oropharyngeal exudate.  Eyes: Conjunctivae and EOM are normal. Pupils are equal, round, and reactive to light. No scleral icterus.  Neck: Normal range of motion. Neck supple. No JVD present. No thyromegaly present.  Cardiovascular: Normal rate, regular rhythm and normal heart  sounds.  No murmur heard. No BLE edema. Pulmonary/Chest: Effort normal and breath sounds normal. No respiratory distress. Abdominal: Soft. Bowel sounds are normal, no distension. There is no tenderness. no masses Breast: no lumps or masses, no nipple discharge or rashes FEMALE GENITALIA:  External genitalia normal External urethra normal Pelvic not done RECTAL: not done Musculoskeletal: Normal range of motion, no joint effusions. No gross deformities Neurological: he is alert and oriented to person, place, and time. No cranial nerve deficit. Coordination, balance, strength, speech and gait are normal.  Skin: Skin is warm and dry. No rash noted. No erythema. Hypertrichosis.  Psychiatric: Patient has a normal mood and affect. behavior is normal. Judgment and thought content normal.    PHQ2/9: Depression screen [facility] 2/9 03/20/2017 02/16/2017 03/13/2016 09/16/2015 06/13/2015  Decreased Interest 0 3 0 0 0  Down, Depressed, Hopeless 0 2 0 0 0  PHQ - 2 Score 0 5 0 0 0  Altered sleeping - 3 - - -  Tired, decreased energy - 3 - - -  Change in appetite - 3 - - -  Feeling bad or failure about yourself  - 2 - - -  Trouble concentrating - 3 - - -  Moving slowly or fidgety/restless - 0 - - -  Suicidal thoughts - 0 - - -  PHQ-9 Score - 19 - - -  Difficult doing work/chores - Very difficult - - -    Fall Risk: Fall Risk  03/20/2017 02/16/2017 09/22/2016 03/13/2016 09/16/2015  Falls in the past year? No No No No No     Functional Status Survey: Is the patient deaf or have difficulty hearing?: No Does the patient have difficulty seeing, even when wearing glasses/contacts?: No Does the patient have difficulty concentrating, remembering, or making decisions?: No Does the patient have difficulty walking or climbing stairs?: No Does the patient have difficulty dressing or bathing?: No Does the patient have difficulty doing errands alone such as visiting a doctor's office or shopping?:  No   Assessment & Plan  1. Well woman exam  Discussed importance of 150 minutes of physical activity weekly,  eat two servings of fish weekly, eat one serving of tree nuts ( cashews, pistachios, pecans, almonds.Marland Kitchen) every other day, eat 6 servings of fruit/vegetables daily and drink plenty of water and avoid sweet beverages.  - Hemoglobin A1c - COMPLETE METABOLIC PANEL WITH GFR - CBC with Differential/Platelet - Lipid panel - TSH - Vitamin B12 - VITAMIN D 25 Hydroxy (Vit-D Deficiency, Fractures)  2. Routine screening for STI (sexually transmitted infection)  - HIV antibody - RPR - C. trachomatis/N. gonorrhoeae RNA - Hepatitis, Acute  3. Need for Tdap vaccination  - Tdap vaccine greater than or equal to 7yo IM

## 2017-03-20 NOTE — Patient Instructions (Signed)
Preventive Care 18-39 Years, Female Preventive care refers to lifestyle choices and visits with your health care provider that can promote health and wellness. What does preventive care include?  A yearly physical exam. This is also called an annual well check.  Dental exams once or twice a year.  Routine eye exams. Ask your health care provider how often you should have your eyes checked.  Personal lifestyle choices, including: ? Daily care of your teeth and gums. ? Regular physical activity. ? Eating a healthy diet. ? Avoiding tobacco and drug use. ? Limiting alcohol use. ? Practicing safe sex. ? Taking vitamin and mineral supplements as recommended by your health care provider. What happens during an annual well check? The services and screenings done by your health care provider during your annual well check will depend on your age, overall health, lifestyle risk factors, and family history of disease. Counseling Your health care provider may ask you questions about your:  Alcohol use.  Tobacco use.  Drug use.  Emotional well-being.  Home and relationship well-being.  Sexual activity.  Eating habits.  Work and work Statistician.  Method of birth control.  Menstrual cycle.  Pregnancy history.  Screening You may have the following tests or measurements:  Height, weight, and BMI.  Diabetes screening. This is done by checking your blood sugar (glucose) after you have not eaten for a while (fasting).  Blood pressure.  Lipid and cholesterol levels. These may be checked every 5 years starting at age 66.  Skin check.  Hepatitis C blood test.  Hepatitis B blood test.  Sexually transmitted disease (STD) testing.  BRCA-related cancer screening. This may be done if you have a family history of breast, ovarian, tubal, or peritoneal cancers.  Pelvic exam and Pap test. This may be done every 3 years starting at age 40. Starting at age 59, this may be done every 5  years if you have a Pap test in combination with an HPV test.  Discuss your test results, treatment options, and if necessary, the need for more tests with your health care provider. Vaccines Your health care provider may recommend certain vaccines, such as:  Influenza vaccine. This is recommended every year.  Tetanus, diphtheria, and acellular pertussis (Tdap, Td) vaccine. You may need a Td booster every 10 years.  Varicella vaccine. You may need this if you have not been vaccinated.  HPV vaccine. If you are 69 or younger, you may need three doses over 6 months.  Measles, mumps, and rubella (MMR) vaccine. You may need at least one dose of MMR. You may also need a second dose.  Pneumococcal 13-valent conjugate (PCV13) vaccine. You may need this if you have certain conditions and were not previously vaccinated.  Pneumococcal polysaccharide (PPSV23) vaccine. You may need one or two doses if you smoke cigarettes or if you have certain conditions.  Meningococcal vaccine. One dose is recommended if you are age 27-21 years and a first-year college student living in a residence hall, or if you have one of several medical conditions. You may also need additional booster doses.  Hepatitis A vaccine. You may need this if you have certain conditions or if you travel or work in places where you may be exposed to hepatitis A.  Hepatitis B vaccine. You may need this if you have certain conditions or if you travel or work in places where you may be exposed to hepatitis B.  Haemophilus influenzae type b (Hib) vaccine. You may need this if  you have certain risk factors.  Talk to your health care provider about which screenings and vaccines you need and how often you need them. This information is not intended to replace advice given to you by your health care provider. Make sure you discuss any questions you have with your health care provider. Document Released: 05/19/2001 Document Revised: 12/11/2015  Document Reviewed: 01/22/2015 Elsevier Interactive Patient Education  2017 [employer].

## 2017-04-30 ENCOUNTER — Ambulatory Visit: Payer: Federal, State, Local not specified - PPO | Admitting: Family Medicine

## 2017-05-24 ENCOUNTER — Other Ambulatory Visit: Payer: Self-pay

## 2017-05-24 MED ORDER — DESOGESTREL-ETHINYL ESTRADIOL 0.15-30 MG-MCG PO TABS: 1.0000 | ORAL_TABLET | Freq: Every day | ORAL | 0 refills | Status: DC

## 2017-05-24 NOTE — Telephone Encounter (Signed)
Patient requesting refill of Apri to [facility].  

## 2017-05-25 NOTE — Telephone Encounter (Signed)
Explained to the patient that  A refill was called in but she will not get anymore refills until seen by physician.  Said she will call back and schedule

## 2017-05-25 NOTE — Telephone Encounter (Signed)
Tried to call pt. VM not set up.  

## 2017-08-13 ENCOUNTER — Other Ambulatory Visit: Payer: Self-pay | Admitting: Family Medicine

## 2017-08-13 DIAGNOSIS — A6000 Herpesviral infection of urogenital system, unspecified: Secondary | ICD-10-CM

## 2017-08-28 ENCOUNTER — Other Ambulatory Visit: Payer: Self-pay | Admitting: Family Medicine

## 2017-10-19 ENCOUNTER — Other Ambulatory Visit (HOSPITAL_COMMUNITY)
Admission: RE | Admit: 2017-10-19 | Discharge: 2017-10-19 | Disposition: A | Discharge status: Home / Self Care | Payer: Federal, State, Local not specified - PPO | Source: Ambulatory Visit | Attending: Family Medicine | Admitting: Family Medicine

## 2017-10-19 ENCOUNTER — Encounter: Payer: Self-pay | Admitting: Family Medicine

## 2017-10-19 ENCOUNTER — Ambulatory Visit: Payer: Federal, State, Local not specified - PPO | Admitting: Family Medicine

## 2017-10-19 VITALS — BP 128/64 | HR 74 | Temp 97.8°F | Resp 18 | Ht 67.5 in | Wt 262.2 lb

## 2017-10-19 DIAGNOSIS — I10 Essential (primary) hypertension: Secondary | ICD-10-CM | POA: Diagnosis not present

## 2017-10-19 DIAGNOSIS — Z13220 Encounter for screening for lipoid disorders: Secondary | ICD-10-CM | POA: Insufficient documentation

## 2017-10-19 DIAGNOSIS — Z5320 Procedure and treatment not carried out because of patient's decision for unspecified reasons: Secondary | ICD-10-CM

## 2017-10-19 DIAGNOSIS — K5909 Other constipation: Secondary | ICD-10-CM

## 2017-10-19 DIAGNOSIS — F331 Major depressive disorder, recurrent, moderate: Secondary | ICD-10-CM

## 2017-10-19 DIAGNOSIS — E559 Vitamin D deficiency, unspecified: Secondary | ICD-10-CM | POA: Diagnosis not present

## 2017-10-19 DIAGNOSIS — E282 Polycystic ovarian syndrome: Secondary | ICD-10-CM | POA: Diagnosis not present

## 2017-10-19 DIAGNOSIS — F4321 Adjustment disorder with depressed mood: Secondary | ICD-10-CM | POA: Diagnosis not present

## 2017-10-19 DIAGNOSIS — E8881 Metabolic syndrome: Secondary | ICD-10-CM | POA: Diagnosis not present

## 2017-10-19 DIAGNOSIS — R5383 Other fatigue: Secondary | ICD-10-CM

## 2017-10-19 DIAGNOSIS — Z113 Encounter for screening for infections with a predominantly sexual mode of transmission: Secondary | ICD-10-CM

## 2017-10-19 DIAGNOSIS — J4520 Mild intermittent asthma, uncomplicated: Secondary | ICD-10-CM | POA: Diagnosis not present

## 2017-10-19 DIAGNOSIS — B356 Tinea cruris: Secondary | ICD-10-CM

## 2017-10-19 MED ORDER — METFORMIN HCL 500 MG PO TABS: 500.0000 mg | ORAL_TABLET | Freq: Every day | ORAL | 1 refills | Status: DC

## 2017-10-19 MED ORDER — BUPROPION HCL ER (XL) 150 MG PO TB24: 150.0000 mg | ORAL_TABLET | Freq: Every day | ORAL | 0 refills | Status: DC

## 2017-10-19 MED ORDER — KETOCONAZOLE 2 % EX CREA: 1.0000 "application " | TOPICAL_CREAM | Freq: Every day | CUTANEOUS | 0 refills | Status: DC

## 2017-10-19 MED ORDER — LORCASERIN HCL ER 20 MG PO TB24: 1.0000 | ORAL_TABLET | Freq: Every day | ORAL | 1 refills | Status: DC

## 2017-10-19 MED ORDER — MONTELUKAST SODIUM 10 MG PO TABS: 10.0000 mg | ORAL_TABLET | Freq: Every day | ORAL | 1 refills | Status: DC

## 2017-10-19 MED ORDER — ATENOLOL 25 MG PO TABS: ORAL_TABLET | ORAL | 1 refills | Status: DC

## 2017-10-19 NOTE — Progress Notes (Signed)
Name: Rachel Dunlap   MRN: [id number]    DOB: [birth year]   Date:10/19/2017       Progress Note  Subjective  Chief Complaint  Chief Complaint  Patient presents with  . Medication Refill  . PCOS  . Hypertension    Sees Dr. Wynelle Link  . Obesity    State she can't get out of the 260-270 range  . Depression    Patient losted her brother that was going through the transition part of becoming a women    HPI  PCOS:she has obesity, HTN, acne and hirsutism, and history of oligomenorrhea. She is back on metformin even though she does not like the side effects.   HTN: she has a history of proteinuria, sees nephrologist, Dr. Ronn Melena. Denies side effects of medication - Vasotec. No chest pain but has occasional palpitation. She is on Vasotec and Atenololand bp is at goal. Denies side effects of medication and is aware of risk of teratogenicity with ace. She states currently abstinent.   Dysmetabolism syndrome: she had  lost 10 lbs with Ozempic , however stopped medication because of cost and weight is up again, she is taking Metformin  She has acanthosis nigricans. Denies polyphagia or polyuria but she feels thirsty all the time.She is back on drinking some sweet beverages, but not as much as she used to  IBS with constipation:she states has abdominal pain, associated with constipation, she is taking Linzess prn. She was doing well, having bowel movements 3 times per week on average but got worse recently and is soiling Advised to resume linzess daily and may add miralax   Obesity: she is still a member at  [employer], however since her brother died ( homicide - hate crime because he was transgender ) June 14 she has not been back, still eating healthy and  meal planning , she has been off Ozempic because of cost. She is going back to the gym today and would like to try another medications  Genital Herpes: not currently sexually active, no episodes since first outbreak in  2016 .No episodes since Dec 2017, check HIV and GC today   Depression Major/grieving: she never properly grieved the loss of her father that died 27-Dec-2016, and now lost her half - brother 09/2017. She contacted a Veterinary surgeon and would like to resume Wellbutrin.  She has no energy, sleeping more than usual. Skipping meals and still frustrated with her weight.Denies suicidal thoughts. States this time it did not affect her grades at school.   Rash inner thighs: going on for a few weeks, itchy , skin is getting darker and sometimes feels like a burn, started a few months ago Not using medication at this time   Patient Active Problem List   Diagnosis Date Noted  . Moderate recurrent major depression (HCC) 02/16/2017  . Back pain due to injury 09/18/2016  . Acne vulgaris 09/16/2015  . Hypertrichosis 09/16/2015  . Chronic constipation 11/09/2014  . Chronic dermatitis 11/09/2014  . Dysmetabolic syndrome 11/09/2014  . Hypertension, benign 11/09/2014  . Genital herpes in women 11/09/2014  . Mild intermittent asthma 11/09/2014  . Allergic rhinitis, seasonal 11/09/2014  . Morbid (severe) obesity due to excess calories (HCC) 11/09/2014  . IBS (irritable bowel syndrome) 11/09/2014  . Bilateral polycystic ovarian syndrome 11/09/2014  . Abnormal presence of protein in urine 11/09/2014  . Vitamin D deficiency 11/09/2014  . Anxiety 11/09/2014    History reviewed. No pertinent surgical history.  Family History  Problem Relation  Age of Onset  . Hypertension Mother   . Cancer Father   . Obesity Maternal Grandmother     Social History   Socioeconomic History  . Marital status: Single    Spouse name: Not on file  . Number of children: 0  . Years of education: Not on file  . Highest education level: Some college, no degree  Occupational History  . Occupation: gas station attendant.     Comment: [employer]  Social Needs  . Financial resource strain: Somewhat hard  . Food insecurity:    Worry:  Never true    Inability: Never true  . Transportation needs:    Medical: No    Non-medical: No  Tobacco Use  . Smoking status: Never Smoker  . Smokeless tobacco: Never Used  Substance and Sexual Activity  . Alcohol use: Yes    Alcohol/week: 0.0 oz    Comment: seldom  . Drug use: No  . Sexual activity: Not Currently    Partners: Male    Birth control/protection: Pill, Abstinence  Lifestyle  . Physical activity:    Days per week: 2 days    Minutes per session: 60 min  . Stress: Not at all  Relationships  . Social connections:    Talks on phone: More than three times a week    Gets together: More than three times a week    Attends religious service: More than 4 times per year    Active member of club or organization: Yes    Attends meetings of clubs or organizations: More than 4 times per year    Relationship status: Never married  . Intimate partner violence:    Fear of current or ex partner: No    Emotionally abused: No    Physically abused: No    Forced sexual activity: No  Other Topics Concern  . 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  albuterol (PROVENTIL HFA;VENTOLIN HFA) 108 (90 Base) MCG/ACT inhaler, Inhale 2 puffs into the lungs every 6 (six) hours as needed for wheezing or shortness of breath., Disp: 1 Inhaler, Rfl: 0 .  APRI 0.15-30 MG-MCG tablet, TAKE 1 TABLET BY MOUTH EVERY DAY, Disp: 84 tablet, Rfl: 2 .  atenolol (TENORMIN) 25 MG tablet, TAKE 1 TABLET BY MOUTH EVERY EVENING FOR BP AND ANXIETY, Disp: 90 tablet, Rfl: 1 .  buPROPion (WELLBUTRIN XL) 150 MG 24 hr tablet, Take 1 tablet (150 mg total) by mouth daily., Disp: 90 tablet, Rfl: 0 .  desoximetasone (TOPICORT) 0.25 % cream, Apply 1 application 2 (two) times daily topically.,  Disp: 100 g, Rfl: 0 .  enalapril (VASOTEC) 5 MG tablet, Take 1 tablet by mouth daily., Disp: , Rfl:  .  fluticasone (FLONASE) 50 MCG/ACT nasal spray, Place 2 sprays into both nostrils at bedtime., Disp: 48 g, Rfl: 1 .  loratadine (CLARITIN) 10 MG tablet, TAKE 1 TABLET BY MOUTH DAILY, Disp: 30 tablet, Rfl: 3 .  metFORMIN (GLUCOPHAGE) 500 MG tablet, Take 1 tablet (500 mg total) by mouth daily with breakfast., Disp: 90 tablet, Rfl: 1 .  montelukast (SINGULAIR) 10 MG tablet, Take 1 tablet (10 mg total) by mouth at bedtime., Disp: 90 tablet, Rfl: 1 .  valACYclovir (VALTREX) 500 MG tablet, TAKE 1 TABLET (500 MG TOTAL) BY MOUTH DAILY. AND TWICE DAILY ON OUTBREAKS, Disp: 115 tablet, Rfl: 0 .  Cholecalciferol (VITAMIN D) 2000 UNITS CAPS, Take 1 capsule by mouth daily., Disp: , Rfl:  .  linaclotide (LINZESS) 72 MCG capsule, Take 1 capsule (72 mcg total) daily before breakfast by mouth. (Patient not taking: Reported on 10/19/2017), Disp: 90 capsule, Rfl: 1  No Known Allergies   ROS  Constitutional: Negative for fever , positive for weight change - gained 11 lbs in the past 6 months .  Respiratory: Negative for cough and shortness of breath.   Cardiovascular: Negative for chest pain or palpitations.  Gastrointestinal: Negative for abdominal pain, no bowel changes.  Musculoskeletal: Negative for gait problem or joint swelling.  Skin: positive for rash.  Neurological: Negative for dizziness or headache.  No other specific complaints in a complete review of systems (except as listed in HPI above).  Objective  Vitals:   10/19/17 0852  BP: 128/64  Pulse: 74  Resp: 18  Temp: 97.8 F (36.6 C)  TempSrc: Oral  SpO2: 99%  Weight: 262 lb 3.2 oz (118.9 kg)  Height: 5' 7.5" (1.715 m)    Body mass index is 40.46 kg/m.  Physical Exam  Constitutional: Patient appears well-developed and well-nourished. Obese  No distress.  HEENT: head atraumatic, normocephalic, pupils equal and reactive to light,   neck supple, throat within normal limits Cardiovascular: Normal rate, regular rhythm and normal heart sounds.  No murmur heard. No BLE edema. Pulmonary/Chest: Effort normal and breath sounds normal. No respiratory distress. Abdominal: Soft.  There is no tenderness. Psychiatric: Patient has a normal mood and affect. behavior is normal. Judgment and thought content normal. Skin: hyperpigmentation on inner thighs, no oozing, well demarcated rash   PHQ2/9: [facility] screen [facility]/9 10/19/2017 03/20/2017 02/16/2017 03/13/2016 09/16/2015  Decreased Interest 1 0 3 0 0  Down, Depressed, Hopeless 2 0 2 0 0  PHQ - 2 Score 3 0 5 0 0  Altered sleeping 2 - 3 - -  Tired, decreased energy 2 - 3 - -  Change in appetite 2 - 3 - -  Feeling bad or failure about yourself  2 - 2 - -  Trouble concentrating 2 - 3 - -  Moving slowly or fidgety/restless 2 - 0 - -  Suicidal thoughts 0 - 0 - -  PHQ-9 Score 15 - 19 - -  Difficult doing work/chores Somewhat difficult - Very difficult - -     Fall Risk: Fall Risk  10/19/2017 03/20/2017 02/16/2017 09/22/2016 03/13/2016  Falls in the past year? No No No No No     Functional Status Survey: Is the patient deaf or have difficulty hearing?: No Does the patient have difficulty seeing, even when wearing glasses/contacts?: No Does the patient have difficulty concentrating, remembering, or making decisions?: No Does the patient have difficulty walking or climbing stairs?: No Does the patient have difficulty dressing or bathing?: No Does the patient have difficulty doing errands alone such as visiting a doctor's office or shopping?: No    Assessment & Plan  1. Moderate recurrent major depression (HCC)  She will start seeing Dr. Andrey CampanileWilson for counseling soon  - buPROPion (WELLBUTRIN XL) 150 MG 24 hr tablet; Take 1 tablet (150 mg total) by mouth daily.  Dispense: 90 tablet; Refill: 0  2. Morbid (severe) obesity due to excess calories (HCC)  she stopped Ozempic because  of cost She is willing to try  Belviq at [facility] stime 20 mg XR daily  - TSH  3. Hypertension, benign  - Urine Microalbumin w/creat. ratio - atenolol (TENORMIN) 25 MG tablet; TAKE 1 TABLET BY MOUTH EVERY EVENING FOR BP AND ANXIETY  Dispense: 90 tablet; Refill: 1  4. Metabolic syndrome  - Hemoglobin A1c - Insulin, random - metFORMIN (GLUCOPHAGE) 500 MG tablet; Take 1 tablet (500 mg total) by mouth daily with breakfast.  Dispense: 90 tablet; Refill: 1  5. Routine screening for STI (sexually transmitted infection)  - Urine cytology ancillary only  6. Grieving  Lost father last year and now her half brother   7. Vitamin D deficiency  - VITAMIN D 25 Hydroxy (Vit-D Deficiency, Fractures)  8. Chronic constipation  Taking linzess prn   9. Other fatigue  - Vitamin B12 - COMPLETE METABOLIC PANEL WITH GFR - CBC with Differential/Platelet - TSH  10. Lipid screening  - Lipid panel   11. Bilateral polycystic ovarian syndrome  - metFORMIN (GLUCOPHAGE) 500 MG tablet; Take 1 tablet (500 mg total) by mouth daily with breakfast.  Dispense: 90 tablet; Refill: 1  12. Mild intermittent asthma, uncomplicated  - montelukast (SINGULAIR) 10 MG tablet; Take 1 tablet (10 mg total) by mouth at bedtime.  Dispense: 90 tablet; Refill: 1  13. HIV screening declined  - HIV antibody

## 2017-10-20 LAB — COMPLETE METABOLIC PANEL WITH GFR
AG Ratio: 1.1 (calc) (ref 1.0–2.5)
ALKALINE PHOSPHATASE (APISO): 52 U/L (ref 33–115)
ALT: 11 U/L (ref 6–29)
AST: 15 U/L (ref 10–30)
Albumin: 3.9 g/dL (ref 3.6–5.1)
BUN: 13 mg/dL (ref 7–25)
CO2: 26 mmol/L (ref 20–32)
CREATININE: 0.83 mg/dL (ref 0.50–1.10)
Calcium: 9.2 mg/dL (ref 8.6–10.2)
Chloride: 105 mmol/L (ref 98–110)
GFR, Est African American: 115 mL/min/{1.73_m2} (ref >=60)
GFR, Est Non African American: 99 mL/min/{1.73_m2} (ref >=60)
GLUCOSE: 82 mg/dL (ref 65–99)
Globulin: 3.4 g/dL (calc) (ref 1.9–3.7)
Potassium: 3.9 mmol/L (ref 3.5–5.3)
Sodium: 138 mmol/L (ref 135–146)
Total Bilirubin: 0.3 mg/dL (ref 0.2–1.2)
Total Protein: 7.3 g/dL (ref 6.1–8.1)

## 2017-10-20 LAB — LIPID PANEL
Cholesterol: 182 mg/dL (ref <200)
HDL: 58 mg/dL (ref >50)
LDL Cholesterol (Calc): 103 mg/dL (calc) — ABNORMAL HIGH
Non-HDL Cholesterol (Calc): 124 mg/dL (calc) (ref <130)
TRIGLYCERIDES: 117 mg/dL (ref <150)
Total CHOL/HDL Ratio: 3.1 (calc) (ref <5.0)

## 2017-10-20 LAB — INSULIN, RANDOM: Insulin: 25.5 u[IU]/mL — ABNORMAL HIGH (ref 2.0–19.6)

## 2017-10-20 LAB — TSH: TSH: 1.43 m[IU]/L

## 2017-10-20 LAB — VITAMIN B12: Vitamin B-12: 255 pg/mL (ref 200–1100)

## 2017-10-20 LAB — CBC WITH DIFFERENTIAL/PLATELET
Basophils Absolute: 36 cells/uL (ref 0–200)
Basophils Relative: 0.4 %
EOS PCT: 3.3 %
Eosinophils Absolute: 297 cells/uL (ref 15–500)
HEMATOCRIT: 37.5 % (ref 35.0–45.0)
Hemoglobin: 12.4 g/dL (ref 11.7–15.5)
LYMPHS ABS: 2448 {cells}/uL (ref 850–3900)
MCH: 27 pg (ref 27.0–33.0)
MCHC: 33.1 g/dL (ref 32.0–36.0)
MCV: 81.5 fL (ref 80.0–100.0)
MPV: 11.8 fL (ref 7.5–12.5)
Monocytes Relative: 6 %
NEUTROS PCT: 63.1 %
Neutro Abs: 5679 cells/uL (ref 1500–7800)
PLATELETS: 243 10*3/uL (ref 140–400)
RBC: 4.6 10*6/uL (ref 3.80–5.10)
RDW: 12.7 % (ref 11.0–15.0)
TOTAL LYMPHOCYTE: 27.2 %
WBC: 9 10*3/uL (ref 3.8–10.8)
WBCMIX: 540 {cells}/uL (ref 200–950)

## 2017-10-20 LAB — MICROALBUMIN / CREATININE URINE RATIO
CREATININE, URINE: 216 mg/dL (ref 20–275)
MICROALB UR: 2.2 mg/dL
Microalb Creat Ratio: 10 mcg/mg creat (ref <30)

## 2017-10-20 LAB — HEMOGLOBIN A1C
EAG (MMOL/L): 6.5 (calc)
HEMOGLOBIN A1C: 5.7 %{Hb} — AB (ref <5.7)
MEAN PLASMA GLUCOSE: 117 (calc)

## 2017-10-20 LAB — VITAMIN D 25 HYDROXY (VIT D DEFICIENCY, FRACTURES): VIT D 25 HYDROXY: 19 ng/mL — AB (ref 30–100)

## 2017-10-20 LAB — HIV ANTIBODY (ROUTINE TESTING W REFLEX): HIV 1&2 Ab, 4th Generation: NONREACTIVE

## 2017-10-20 LAB — URINE CYTOLOGY ANCILLARY ONLY
CHLAMYDIA, DNA PROBE: NEGATIVE
Neisseria Gonorrhea: NEGATIVE

## 2017-10-23 ENCOUNTER — Other Ambulatory Visit: Payer: Self-pay | Admitting: Family Medicine

## 2017-10-23 MED ORDER — VITAMIN D (ERGOCALCIFEROL) 1.25 MG (50000 UNIT) PO CAPS: 50000.0000 [IU] | ORAL_CAPSULE | ORAL | 0 refills | Status: DC

## 2017-10-26 ENCOUNTER — Encounter: Payer: Self-pay | Admitting: Family Medicine

## 2017-10-26 DIAGNOSIS — F321 Major depressive disorder, single episode, moderate: Secondary | ICD-10-CM | POA: Diagnosis not present

## 2017-10-27 DIAGNOSIS — I10 Essential (primary) hypertension: Secondary | ICD-10-CM | POA: Diagnosis not present

## 2017-10-27 DIAGNOSIS — E559 Vitamin D deficiency, unspecified: Secondary | ICD-10-CM | POA: Diagnosis not present

## 2017-10-27 DIAGNOSIS — E1129 Type 2 diabetes mellitus with other diabetic kidney complication: Secondary | ICD-10-CM | POA: Diagnosis not present

## 2017-10-28 ENCOUNTER — Other Ambulatory Visit: Payer: Self-pay | Admitting: Family Medicine

## 2017-10-28 DIAGNOSIS — E559 Vitamin D deficiency, unspecified: Secondary | ICD-10-CM | POA: Diagnosis not present

## 2017-10-28 DIAGNOSIS — F331 Major depressive disorder, recurrent, moderate: Secondary | ICD-10-CM

## 2017-10-28 DIAGNOSIS — E1129 Type 2 diabetes mellitus with other diabetic kidney complication: Secondary | ICD-10-CM | POA: Diagnosis not present

## 2017-10-28 DIAGNOSIS — I10 Essential (primary) hypertension: Secondary | ICD-10-CM | POA: Diagnosis not present

## 2017-10-28 DIAGNOSIS — E668 Other obesity: Secondary | ICD-10-CM

## 2017-11-13 DIAGNOSIS — F321 Major depressive disorder, single episode, moderate: Secondary | ICD-10-CM | POA: Diagnosis not present

## 2017-11-23 ENCOUNTER — Other Ambulatory Visit: Payer: Self-pay

## 2017-11-23 ENCOUNTER — Emergency Department (HOSPITAL_COMMUNITY)
Admission: EM | Admit: 2017-11-23 | Discharge: 2017-11-23 | Discharge status: Absent without leave | Payer: Federal, State, Local not specified - PPO

## 2017-11-23 ENCOUNTER — Encounter: Payer: Self-pay | Admitting: *Deleted

## 2017-11-23 ENCOUNTER — Emergency Department: Payer: Federal, State, Local not specified - PPO

## 2017-11-23 ENCOUNTER — Emergency Department
Admission: EM | Admit: 2017-11-23 | Discharge: 2017-11-23 | Disposition: A | Discharge status: Home / Self Care | Payer: Federal, State, Local not specified - PPO | Attending: Emergency Medicine | Admitting: Emergency Medicine

## 2017-11-23 DIAGNOSIS — Y9389 Activity, other specified: Secondary | ICD-10-CM | POA: Diagnosis not present

## 2017-11-23 DIAGNOSIS — Z79899 Other long term (current) drug therapy: Secondary | ICD-10-CM | POA: Insufficient documentation

## 2017-11-23 DIAGNOSIS — M7918 Myalgia, other site: Secondary | ICD-10-CM | POA: Diagnosis not present

## 2017-11-23 DIAGNOSIS — S299XXA Unspecified injury of thorax, initial encounter: Secondary | ICD-10-CM | POA: Diagnosis not present

## 2017-11-23 DIAGNOSIS — M546 Pain in thoracic spine: Secondary | ICD-10-CM | POA: Diagnosis not present

## 2017-11-23 DIAGNOSIS — S199XXA Unspecified injury of neck, initial encounter: Secondary | ICD-10-CM | POA: Diagnosis not present

## 2017-11-23 DIAGNOSIS — Y999 Unspecified external cause status: Secondary | ICD-10-CM | POA: Diagnosis not present

## 2017-11-23 DIAGNOSIS — M542 Cervicalgia: Secondary | ICD-10-CM | POA: Diagnosis not present

## 2017-11-23 DIAGNOSIS — S161XXA Strain of muscle, fascia and tendon at neck level, initial encounter: Secondary | ICD-10-CM | POA: Insufficient documentation

## 2017-11-23 DIAGNOSIS — Y92410 Unspecified street and highway as the place of occurrence of the external cause: Secondary | ICD-10-CM | POA: Insufficient documentation

## 2017-11-23 MED ORDER — IBUPROFEN 800 MG PO TABS: 800.0000 mg | ORAL_TABLET | Freq: Three times a day (TID) | ORAL | 0 refills | Status: DC | PRN

## 2017-11-23 MED ORDER — CYCLOBENZAPRINE HCL 10 MG PO TABS
10.0000 mg | ORAL_TABLET | Freq: Once | ORAL | Status: AC
  Administered 2017-11-23: 10 mg via ORAL
  Filled 2017-11-23: qty 1

## 2017-11-23 MED ORDER — CYCLOBENZAPRINE HCL 5 MG PO TABS: 5.0000 mg | ORAL_TABLET | Freq: Three times a day (TID) | ORAL | 0 refills | Status: DC | PRN
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
## 2017-11-30 ENCOUNTER — Encounter: Payer: Self-pay | Admitting: Family Medicine

## 2017-11-30 ENCOUNTER — Ambulatory Visit: Payer: Federal, State, Local not specified - PPO | Admitting: Family Medicine

## 2017-11-30 VITALS — BP 126/82 | HR 86 | Temp 98.0°F | Resp 16 | Ht 68.0 in | Wt 260.6 lb

## 2017-11-30 DIAGNOSIS — Z23 Encounter for immunization: Secondary | ICD-10-CM

## 2017-11-30 DIAGNOSIS — F331 Major depressive disorder, recurrent, moderate: Secondary | ICD-10-CM | POA: Diagnosis not present

## 2017-11-30 DIAGNOSIS — E8881 Metabolic syndrome: Secondary | ICD-10-CM

## 2017-11-30 DIAGNOSIS — I10 Essential (primary) hypertension: Secondary | ICD-10-CM | POA: Diagnosis not present

## 2017-11-30 MED ORDER — BUPROPION HCL ER (XL) 150 MG PO TB24: 150.0000 mg | ORAL_TABLET | Freq: Every day | ORAL | 0 refills | Status: DC
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
## 2017-12-14 ENCOUNTER — Encounter: Payer: Self-pay | Admitting: Family Medicine

## 2017-12-20 DIAGNOSIS — F321 Major depressive disorder, single episode, moderate: Secondary | ICD-10-CM | POA: Diagnosis not present

## 2017-12-29 ENCOUNTER — Ambulatory Visit (INDEPENDENT_AMBULATORY_CARE_PROVIDER_SITE_OTHER): Payer: Self-pay | Admitting: Bariatrics

## 2017-12-29 ENCOUNTER — Encounter (INDEPENDENT_AMBULATORY_CARE_PROVIDER_SITE_OTHER): Payer: Self-pay

## 2018-01-03 DIAGNOSIS — F321 Major depressive disorder, single episode, moderate: Secondary | ICD-10-CM | POA: Diagnosis not present

## 2018-01-05 ENCOUNTER — Encounter: Payer: Self-pay | Admitting: Family Medicine

## 2018-01-06 ENCOUNTER — Ambulatory Visit (INDEPENDENT_AMBULATORY_CARE_PROVIDER_SITE_OTHER): Payer: Federal, State, Local not specified - PPO | Admitting: Bariatrics

## 2018-01-06 ENCOUNTER — Encounter (INDEPENDENT_AMBULATORY_CARE_PROVIDER_SITE_OTHER): Payer: Self-pay | Admitting: Bariatrics

## 2018-01-06 VITALS — BP 123/82 | HR 66 | Temp 97.7°F | Ht 67.0 in | Wt 254.0 lb

## 2018-01-06 DIAGNOSIS — R0602 Shortness of breath: Secondary | ICD-10-CM | POA: Diagnosis not present

## 2018-01-06 DIAGNOSIS — F3289 Other specified depressive episodes: Secondary | ICD-10-CM

## 2018-01-06 DIAGNOSIS — R5383 Other fatigue: Secondary | ICD-10-CM

## 2018-01-06 DIAGNOSIS — Z0289 Encounter for other administrative examinations: Secondary | ICD-10-CM

## 2018-01-06 DIAGNOSIS — I10 Essential (primary) hypertension: Secondary | ICD-10-CM

## 2018-01-06 DIAGNOSIS — R7303 Prediabetes: Secondary | ICD-10-CM

## 2018-01-06 DIAGNOSIS — Z6839 Body mass index (BMI) 39.0-39.9, adult: Secondary | ICD-10-CM

## 2018-01-06 DIAGNOSIS — Z9189 Other specified personal risk factors, not elsewhere classified: Secondary | ICD-10-CM | POA: Diagnosis not present

## 2018-01-06 DIAGNOSIS — E559 Vitamin D deficiency, unspecified: Secondary | ICD-10-CM

## 2018-01-06 DIAGNOSIS — Z8742 Personal history of other diseases of the female genital tract: Secondary | ICD-10-CM

## 2018-01-07 LAB — VITAMIN D 25 HYDROXY (VIT D DEFICIENCY, FRACTURES): Vit D, 25-Hydroxy: 43.7 ng/mL (ref 30.0–100.0)

## 2018-01-08 DIAGNOSIS — R51 Headache: Secondary | ICD-10-CM | POA: Diagnosis not present

## 2018-01-08 DIAGNOSIS — J0140 Acute pansinusitis, unspecified: Secondary | ICD-10-CM | POA: Diagnosis not present

## 2018-01-08 DIAGNOSIS — J069 Acute upper respiratory infection, unspecified: Secondary | ICD-10-CM | POA: Diagnosis not present

## 2018-01-10 NOTE — Progress Notes (Signed)
Office: [phone]  /  Fax: [phone]   Dear Dr. Carlynn Purl,   Thank you for referring [name] to our clinic. The following note includes my evaluation and treatment recommendations.  HPI:   Chief Complaint: OBESITY    [name] has been referred by Alba Cory, MD for consultation regarding her obesity and obesity related comorbidities.    Rachel Dunlap (MR# [id number]) is a 22 y.o. female who presents on 01/06/2018 for obesity evaluation and treatment. Current BMI is Body mass index is 39.78 kg/m.[name] has been struggling with her weight for many years and has been unsuccessful in either losing weight, maintaining weight loss, or reaching her healthy weight goal.     [name] is snacking on potato chips, craves potatoes and tacos, and she skips lunch.     [name] attended our information session and states she is currently in the action stage of change and ready to dedicate time achieving and maintaining a healthier weight. [name] is interested in becoming our patient and working on intensive lifestyle modifications including (but not limited to) diet, exercise and weight loss.    [name] states she thinks her family will eat healthier with  her her desired weight loss is 84 lbs she has been heavy most of  her life she started gaining weight in 2nd grade her heaviest weight ever was 267 lbs she has significant food cravings issues  she skips meals frequently she is frequently drinking liquids with calories she frequently makes poor food choices she has problems with excessive hunger  she frequently eats larger portions than normal  she struggles with emotional eating    Fatigue [name] feels her energy is lower than it should be. This has worsened with weight gain and has not worsened recently. [name] admits to daytime somnolence and  admits to waking up still tired. Patient is at risk for obstructive sleep apnea. Patent has a history of symptoms of daytime fatigue. Patient  generally gets 6 hours of sleep per night, and states they generally have generally restful sleep. Snoring is present. Apneic episodes are not present. Epworth Sleepiness Score is 9.  Dyspnea on exertion [name] notes increasing shortness of breath with exercising and seems to be worsening over time with weight gain. She notes getting out of breath sooner with activity than she used to. She has a history of asthma and uses inhaler <1 month. She takes montelukast, albuterol, and Fluticasone. This has not gotten worse recently. [name] denies orthopnea.  Hypertension [name] is a 22 y.o. female with hypertension. [name] is taking atenolol and enalapril and blood pressure is reasonably controlled. She denies chest pain or hypotension. She is working weight loss to help control her blood pressure with the goal of decreasing her risk of heart attack and stroke.   Vitamin D Deficiency [name] has a diagnosis of vitamin D deficiency. She is taking high dose prescription Vit D and denies nausea, vomiting or muscle weakness.  History of Polycystic Ovarian Syndrome [name] has a history of PCOS, she is taking metformin and birth control-estrogen/progestin with no side effects. Her cycles are regular with birth control.  Pre-Diabetes [name] has a diagnosis of pre-diabetes based on her elevated Hgb A1c and was informed this puts her at greater risk of developing diabetes. Last A1c of 5.7 and insulin of 25.5, and she has been taking metformin for about 1 month. She notes decrease in hunger. Shewants to work on diet and exercise to decrease risk of diabetes. She denies nausea or  hypoglycemia.  At risk for diabetes [name] is at higher than average risk for developing diabetes due to her obesity, polycystic ovarian syndrome, and pre-diabetes. She currently denies polyuria or polydipsia.  Depression with emotional eating behaviors [name] sees a therapist and does not work on eating. [name] struggles with emotional  eating and using food for comfort to the extent that it is negatively impacting her health. She often snacks when she is not hungry. [name] sometimes feels she is out of control and then feels guilty that she made poor food choices. She has been working on behavior modification techniques to help reduce her emotional eating and has been somewhat successful. She shows no sign of suicidal or homicidal ideations.  Depression screen [facility] 2/9 01/06/2018 11/30/2017 10/19/2017 03/20/2017 02/16/2017  Decreased Interest 2 0 1 0 3  Down, Depressed, Hopeless 2 1 2  0 2  PHQ - 2 Score 4 1 3  0 5  Altered sleeping 0 1 2 - 3  Tired, decreased energy 2 1 2  - 3  Change in appetite 3 0 2 - 3  Feeling bad or failure about yourself  0 1 2 - 2  Trouble concentrating 1 0 2 - 3  Moving slowly or fidgety/restless 0 1 2 - 0  Suicidal thoughts 0 0 0 - 0  PHQ-9 Score 10 5 15  - 19  Difficult doing work/chores Somewhat difficult Somewhat difficult Somewhat difficult - Very difficult    Depression Screen [name]'s Food and Mood (modified PHQ-9) score was  Depression screen PHQ 2/9 01/06/2018  Decreased Interest 2  Down, Depressed, Hopeless 2  PHQ - 2 Score 4  Altered sleeping 0  Tired, decreased energy 2  Change in appetite 3  Feeling bad or failure about yourself  0  Trouble concentrating 1  Moving slowly or fidgety/restless 0  Suicidal thoughts 0  PHQ-9 Score 10  Difficult doing work/chores Somewhat difficult    ALLERGIES: No Known Allergies  MEDICATIONS: Current Outpatient Medications on File Prior to Visit  Medication Sig Dispense Refill  . albuterol (PROVENTIL HFA;VENTOLIN HFA) 108 (90 Base) MCG/ACT inhaler Inhale 2 puffs into the lungs every 6 (six) hours as needed for wheezing or shortness of breath. 1 Inhaler 0  . APRI 0.15-30 MG-MCG tablet TAKE 1 TABLET BY MOUTH EVERY DAY 84 tablet 2  . atenolol (TENORMIN) 25 MG tablet TAKE 1 TABLET BY MOUTH EVERY EVENING FOR BP AND ANXIETY 90 tablet 1  . buPROPion  (WELLBUTRIN XL) 150 MG 24 hr tablet Take 1 tablet (150 mg total) by mouth daily. 90 tablet 0  . Cholecalciferol (VITAMIN D) 2000 UNITS CAPS Take 1 capsule by mouth daily.    . cyclobenzaprine (FLEXERIL) 5 MG tablet Take 1 tablet (5 mg total) by mouth 3 (three) times daily as needed for muscle spasms. 15 tablet 0  . desoximetasone (TOPICORT) 0.25 % cream Apply 1 application 2 (two) times daily topically. 100 g 0  . enalapril (VASOTEC) 5 MG tablet Take 1 tablet by mouth daily.    . fluticasone (FLONASE) 50 MCG/ACT nasal spray Place 2 sprays into both nostrils at bedtime. 48 g 1  . ibuprofen (ADVIL,MOTRIN) 800 MG tablet Take 1 tablet (800 mg total) by mouth every 8 (eight) hours as needed. 30 tablet 0  . ketoconazole (NIZORAL) 2 % cream Apply 1 application topically at bedtime. 60 g 0  . linaclotide (LINZESS) 72 MCG capsule Take 1 capsule (72 mcg total) daily before breakfast by mouth. 90 capsule 1  . loratadine (CLARITIN)  10 MG tablet TAKE 1 TABLET BY MOUTH DAILY 30 tablet 3  . metFORMIN (GLUCOPHAGE) 500 MG tablet Take 1 tablet (500 mg total) by mouth daily with breakfast. 90 tablet 1  . montelukast (SINGULAIR) 10 MG tablet Take 1 tablet (10 mg total) by mouth at bedtime. 90 tablet 1  . valACYclovir (VALTREX) 500 MG tablet TAKE 1 TABLET (500 MG TOTAL) BY MOUTH DAILY. AND TWICE DAILY ON OUTBREAKS 115 tablet 0  . Vitamin D, Ergocalciferol, (DRISDOL) 50000 units CAPS capsule Take 1 capsule (50,000 Units total) by mouth every 7 (seven) days. 12 capsule 0   No current facility-administered medications on file prior to visit.     PAST MEDICAL HISTORY: Past Medical History:  Diagnosis Date  . Acne   . Allergy   . Anxiety   . Asthma   . Chronic constipation   . Chronic dermatitis   . Constipation   . Depression   . Dermatitis   . Dysmetabolic syndrome   . Genital herpes   . Hypertension   . Irritable bowel syndrome without diarrhea   . Kidney disease   . Morbid obesity with BMI of 40.0-44.9,  adult (HCC)   . Panic disorder   . PCOS (polycystic ovarian syndrome)   . Pre-diabetes   . Proteinuria   . Stomach ulcer   . Vitamin D deficiency     PAST SURGICAL HISTORY: History reviewed. No pertinent surgical history.  SOCIAL HISTORY: Social History   Tobacco Use  . Smoking status: Never Smoker  . Smokeless tobacco: Never Used  Substance Use Topics  . Alcohol use: Yes    Alcohol/week: 0.0 standard drinks    Comment: seldom  . Drug use: No    FAMILY HISTORY: Family History  Problem Relation Age of Onset  . Hypertension Mother   . Obesity Mother   . Cancer Father   . Diabetes Father   . Kidney disease Father   . Obesity Maternal Grandmother     ROS: Review of Systems  Constitutional: Positive for malaise/fatigue. Negative for weight loss.  HENT: Positive for nosebleeds and sinus pain.        + Nasal stuffiness + Dry mouth  Respiratory: Positive for shortness of breath (with exertion).   Cardiovascular: Negative for chest pain and orthopnea.       + Chest pain/discomfort + Calf/leg pain with walking + Leg cramping  Gastrointestinal: Positive for constipation and heartburn. Negative for nausea and vomiting.  Genitourinary: Negative for frequency.  Musculoskeletal: Positive for neck pain.       Negative muscle weakness + Neck stiffness + Muscle stiffness  Skin:       + Dryness  Neurological: Positive for weakness.  Endo/Heme/Allergies: Positive for polydipsia.       Negative hypoglycemia  Psychiatric/Behavioral: Positive for depression. Negative for suicidal ideas. The patient is nervous/anxious.        + Stress    PHYSICAL EXAM: Blood pressure 123/82, pulse 66, temperature 97.7 F (36.5 C), temperature source Oral, height 5\' 7"  (1.702 m), weight 254 lb (115.2 kg), last menstrual period 12/15/2017, SpO2 100 %. Body mass index is 39.78 kg/m. Physical Exam  Constitutional: She is oriented to person, place, and time. She appears well-developed and  well-nourished.  HENT:  Head: Normocephalic and atraumatic.  Nose: Nose normal.  Eyes: EOM are normal. No scleral icterus.  Neck: Normal range of motion. Neck supple. No thyromegaly present.  + Mallampati= 4  Cardiovascular: Normal rate and regular rhythm.  Pulmonary/Chest:  Effort normal. No respiratory distress.  Abdominal: Soft. There is no tenderness.  + Obesity  Musculoskeletal:  Range of Motion normal in all 4 extremities Trace edema noted in bilateral lower extremities  Neurological: She is alert and oriented to person, place, and time. Coordination normal.  Skin: Skin is warm and dry.  Psychiatric: She has a normal mood and affect. Her behavior is normal.  Vitals reviewed.   RECENT LABS AND TESTS: BMET    Component Value Date/Time   NA 138 10/19/2017 0950   K 3.9 10/19/2017 0950   CL 105 10/19/2017 0950   CO2 26 10/19/2017 0950   GLUCOSE 82 10/19/2017 0950   BUN 13 10/19/2017 0950   CREATININE 0.83 10/19/2017 0950   CALCIUM 9.2 10/19/2017 0950   GFRNONAA 99 10/19/2017 0950   GFRAA 115 10/19/2017 0950   Lab Results  Component Value Date   HGBA1C 5.7 (H) 10/19/2017   No results found for: INSULIN CBC    Component Value Date/Time   WBC 9.0 10/19/2017 0950   RBC 4.60 10/19/2017 0950   HGB 12.4 10/19/2017 0950   HCT 37.5 10/19/2017 0950   PLT 243 10/19/2017 0950   MCV 81.5 10/19/2017 0950   MCH 27.0 10/19/2017 0950   MCHC 33.1 10/19/2017 0950   RDW 12.7 10/19/2017 0950   LYMPHSABS 2,448 10/19/2017 0950   MONOABS 935 07/21/2016 1215   EOSABS 297 10/19/2017 0950   BASOSABS 36 10/19/2017 0950   Iron/TIBC/Ferritin/ %Sat No results found for: IRON, TIBC, FERRITIN, IRONPCTSAT Lipid Panel     Component Value Date/Time   CHOL 182 10/19/2017 0950   TRIG 117 10/19/2017 0950   HDL 58 10/19/2017 0950   CHOLHDL 3.1 10/19/2017 0950   VLDL 20 03/13/2016 0001   LDLCALC 103 (H) 10/19/2017 0950   Hepatic Function Panel     Component Value Date/Time   PROT 7.3  10/19/2017 0950   ALBUMIN 3.8 03/13/2016 0001   AST 15 10/19/2017 0950   ALT 11 10/19/2017 0950   ALKPHOS 55 03/13/2016 0001   BILITOT 0.3 10/19/2017 0950      Component Value Date/Time   TSH 1.43 10/19/2017 0950    ECG  shows NSR with a rate of 75 BPM INDIRECT CALORIMETER done today shows a VO2 of 311 and a REE of 2167.  Her calculated basal metabolic rate is 4098 thus her basal metabolic rate is better than expected.    ASSESSMENT AND PLAN: Other fatigue - Plan: EKG 12-Lead  Shortness of breath on exertion  Prediabetes  Vitamin D deficiency - Plan: VITAMIN D 25 Hydroxy (Vit-D Deficiency, Fractures)  Essential hypertension  History of PCOS  Other depression - with emotional eating  At risk for diabetes mellitus  Class 2 severe obesity with serious comorbidity and body mass index (BMI) of 39.0 to 39.9 in adult, unspecified obesity type (HCC)  PLAN:  Fatigue Vanessa was informed that her fatigue may be related to obesity, depression or many other causes. Labs will be ordered, and in the meanwhile [place] has agreed to work on diet, exercise and weight loss to help with fatigue. Proper sleep hygiene was discussed including the need for 7-8 hours of quality sleep each night. A sleep study was not ordered based on symptoms and Epworth score.  Dyspnea on exertion Liat's shortness of breath appears to be obesity related and exercise induced. She has agreed to work on weight loss, begin walking and gradually increase exercise to treat her exercise induced shortness of breath. If  Rosmary follows our instructions and loses weight without improvement of her shortness of breath, we will plan to refer to pulmonology. We will monitor this condition regularly. Shaquila agrees to this plan.  Hypertension We discussed sodium restriction, working on healthy weight loss, and a regular exercise program as the means to achieve improved blood pressure control. Nasteho agreed with this plan and agreed  to follow up as directed. We will continue to monitor her blood pressure as well as her progress with the above lifestyle modifications. Katyana agrees to continue her anti-hypertensive medications as prescribed and will watch for signs of hypotension as she continues her lifestyle modifications. Donta agrees to follow up with our clinic in 2 weeks.  Vitamin D Deficiency Sanaiyah was informed that low vitamin D levels contributes to fatigue and are associated with obesity, breast, and colon cancer. Callia agrees to continue taking prescription Vit D @50 ,000 IU every week and will follow up for routine testing of vitamin D, at least 2-3 times per year. She was informed of the risk of over-replacement of vitamin D and agrees to not increase her dose unless she discusses this with [place] first. We will check labs and Kalin agrees to follow up with our clinic in 2 weeks.  History of Polycystic Ovarian Syndrome We will check A1c and insulin at next visit, and Jolinda agrees to follow up with our clinic in 2 weeks.  Pre-Diabetes Mariadejesus will continue to work on weight loss, exercise, and decreasing simple carbohydrates in her diet to help decrease the risk of diabetes. We dicussed metformin including benefits and risks. She was informed that eating too many simple carbohydrates or too many calories at one sitting increases the likelihood of GI side effects. Aasiya agrees to continue her medications as prescribed and we will check labs at next visit. Yen agrees to follow up with our clinic in 2 weeks as directed to monitor her progress.  Diabetes risk counselling Rexanne was given extended (15 minutes) diabetes prevention counseling today. She is 22 y.o. female and has risk factors for diabetes including obesity, polycystic ovarian syndrome, and pre-diabetes. We discussed intensive lifestyle modifications today with an emphasis on weight loss as well as increasing exercise and decreasing simple carbohydrates in her  diet.  Depression with Emotional Eating Behaviors We discussed behavior modification techniques today to help Sydnie deal with her emotional eating and depression. We will refer to Dr. Dewaine Conger, our bariatric psychologist. Breah agrees to follow up with our clinic in 2 weeks.  Depression Screen Brande had a moderately positive depression screening. Depression is commonly associated with obesity and often results in emotional eating behaviors. We will monitor this closely and work on CBT to help improve the non-hunger eating patterns. Referral to Psychology may be required if no improvement is seen as she continues in our clinic.  Obesity Jizelle is currently in the action stage of change and her goal is to continue with weight loss efforts. I recommend Analynn begin the structured treatment plan as follows:  She has agreed to follow the Category 3 plan Sherrey has been instructed to eventually work up to a goal of 150 minutes of combined cardio and strengthening exercise per week for weight loss and overall health benefits. We discussed the following Behavioral Modification Strategies today: increasing lean protein intake, decreasing simple carbohydrates, increasing vegetables, decreasing sodium intake, decrease eating out, work on meal planning and easy cooking plans, emotional eating strategies, increase H20 intake, no skipping meals, keeping healthy foods in the home,  and better snacking choices   She was informed of the importance of frequent follow up visits to maximize her success with intensive lifestyle modifications for her multiple health conditions. She was informed we would discuss her lab results at her next visit unless there is a critical issue that needs to be addressed sooner. Claudette agreed to keep her next visit at the agreed upon time to discuss these results.    OBESITY BEHAVIORAL INTERVENTION VISIT  Today's visit was # 1   Starting weight: 254 lbs Starting date: 01/06/18 Today's  weight : 254 lbs  Today's date: 01/06/2018 Total lbs lost to date: 0    ASK: We discussed the diagnosis of obesity with Gladys Damme today and Jessicah agreed to give [place] permission to discuss obesity behavioral modification therapy today.  ASSESS: Hannelore has the diagnosis of obesity and her BMI today is 39.77 Rosette is in the action stage of change   ADVISE: Armilda was educated on the multiple health risks of obesity as well as the benefit of weight loss to improve her health. She was advised of the need for long term treatment and the importance of lifestyle modifications to improve her current health and to decrease her risk of future health problems.  AGREE: Multiple dietary modification options and treatment options were discussed and  Tenia agreed to follow the recommendations documented in the above note.  ARRANGE: Elton was educated on the importance of frequent visits to treat obesity as outlined per CMS and USPSTF guidelines and agreed to schedule her next follow up appointment today.  Trude Mcburney, am acting as transcriptionist for [employer], DO  I have reviewed the above documentation for accuracy and completeness, and I agree with the above. -Corinna Capra, DO

## 2018-01-11 ENCOUNTER — Other Ambulatory Visit: Payer: Self-pay | Admitting: Family Medicine

## 2018-01-17 DIAGNOSIS — F321 Major depressive disorder, single episode, moderate: Secondary | ICD-10-CM | POA: Diagnosis not present

## 2018-01-20 ENCOUNTER — Ambulatory Visit (INDEPENDENT_AMBULATORY_CARE_PROVIDER_SITE_OTHER): Payer: Federal, State, Local not specified - PPO | Admitting: Psychology

## 2018-01-20 ENCOUNTER — Ambulatory Visit (INDEPENDENT_AMBULATORY_CARE_PROVIDER_SITE_OTHER): Payer: Federal, State, Local not specified - PPO | Admitting: Bariatrics

## 2018-01-26 ENCOUNTER — Ambulatory Visit (INDEPENDENT_AMBULATORY_CARE_PROVIDER_SITE_OTHER): Payer: Federal, State, Local not specified - PPO | Admitting: Psychology

## 2018-01-26 DIAGNOSIS — F3289 Other specified depressive episodes: Secondary | ICD-10-CM | POA: Diagnosis not present
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
## 2018-01-27 ENCOUNTER — Ambulatory Visit (INDEPENDENT_AMBULATORY_CARE_PROVIDER_SITE_OTHER): Payer: Federal, State, Local not specified - PPO | Admitting: Bariatrics

## 2018-01-27 ENCOUNTER — Encounter (INDEPENDENT_AMBULATORY_CARE_PROVIDER_SITE_OTHER): Payer: Self-pay | Admitting: Bariatrics

## 2018-01-27 VITALS — BP 131/79 | HR 75 | Temp 97.9°F | Ht 67.0 in | Wt 252.0 lb

## 2018-01-27 DIAGNOSIS — R7303 Prediabetes: Secondary | ICD-10-CM | POA: Diagnosis not present

## 2018-01-27 DIAGNOSIS — Z9189 Other specified personal risk factors, not elsewhere classified: Secondary | ICD-10-CM | POA: Diagnosis not present

## 2018-01-27 DIAGNOSIS — I10 Essential (primary) hypertension: Secondary | ICD-10-CM | POA: Diagnosis not present

## 2018-01-27 DIAGNOSIS — E559 Vitamin D deficiency, unspecified: Secondary | ICD-10-CM

## 2018-01-27 DIAGNOSIS — F3289 Other specified depressive episodes: Secondary | ICD-10-CM

## 2018-01-27 DIAGNOSIS — E282 Polycystic ovarian syndrome: Secondary | ICD-10-CM

## 2018-01-27 DIAGNOSIS — Z6839 Body mass index (BMI) 39.0-39.9, adult: Secondary | ICD-10-CM

## 2018-01-31 DIAGNOSIS — F321 Major depressive disorder, single episode, moderate: Secondary | ICD-10-CM | POA: Diagnosis not present

## 2018-02-01 NOTE — Progress Notes (Unsigned)
Office: [phone]  /  Fax: [phone]    Date: February 09, 2018  Time Seen:*** Duration:*** Provider: Lawerance Cruel, Psy.D. Type of Session: Individual Therapy   HPI: Rachel Dunlap was referred by Dr. Corinna Capra due to depression with emotional eating behaviors. She was seen for an initial appointment by this provider on January 26, 2018. Per the note for the initial visit with Dr. Corinna Capra on January 06, 2018, "Rachel Dunlap a therapist and does not work on eating. Rachel Dunlap struggleswith emotional eating and using food for comfort to the extent that it is negatively impacting herhealth. Sheoften snacks when sheis not hungry. Cariasometimes feels sheis out of control and then feels guilty that shemade poor food choices. Rachel Dunlap been working on behavior modification techniques to help reduce heremotional eating and has been somewhat successful.Sheshows no sign of suicidal or homicidal ideations." Per the note for the initial visit with Dr. Corinna Capra on January 06, 2018, Rachel Dunlap has been heavy most of her life and she started gaining weight in the second grade. Her heaviest weight ever was 267 pounds. During the initial point with Dr. Manson Passey, Rachel Dunlap reported experiencing the following: Significant food cravings issues; skipping meals frequently; frequently drinking liquids with calories; frequently making poor food choices; having problems with excessive hunger; frequently eating larger portions than normal; and struggling with emotional eating. During the initial appointment with this provider, Rachel Dunlap reported, "I'm really an emotional eater. My father passed away last year." She added her brother passed away two to three months after her father passed away. In addition, Rachel Dunlap indicated she is working toward graduating in December of this year. She stated she tends to crave salty foods. Rachel Dunlap indicated emotional eating started in sixth grade. She noted in elementary school she explained a boy she liked  called her "ugly" and she believes that precipitated the emotional eating. She denied a history of binge eating. Rachel Dunlap denied a history of purging and engagement in other compensatory strategies. Rachel Dunlap denied ever being diagnosed with an eating disorder. Furthermore, Rachel Dunlap was asked to complete a questionnaire assessing various behaviors related to emotional eating. Rachel Dunlap endorsed the following: overeat when you are celebrating, experience food cravings on a regular basis, eat certain foods when you are anxious, stressed, depressed, or your feelings are hurt, use food to help you cope with emotional situations, find food is comforting to you, overeat when you are angry or upset, overeat when you are worried about something, not worry about what you eat when you are in a good mood, eat to help you stay awake and eat as a reward.  Session Content: Session focused on the following treatment goal: decrease emotional eating. The session was initiated with the administration of the PHQ-9 and GAD-7, as well as a brief check-in.   Rachel Dunlap was receptive to today's session as evidenced by ***.  Mental Status Examination: Rachel Dunlap arrived on time for the appointment. She presented as appropriately dressed and groomed. Rachel Dunlap appeared her stated age and demonstrated adequate orientation to time, place, person, and purpose of the appointment. She also demonstrated appropriate eye contact. No psychomotor abnormalities or behavioral peculiarities noted. Her mood was {gbmood:21757} with congruent affect. Her thought processes were logical, linear, and goal-directed. No hallucinations, delusions, bizarre thinking or behavior reported or observed. Judgment, insight, and impulse control appeared to be grossly intact. There was no evidence of paraphasias (i.e., errors in speech, gross mispronunciations, and word substitutions), repetition deficits, or disturbances in volume or prosody (i.e., rhythm and intonation). There was no  evidence of attention or memory impairments. Rachel Dunlap denied current suicidal and homicidal ideation, intent or plan.  Structured Assessment Results: The Patient Health Questionnaire-9 (PHQ-9) is a self-report measure that assesses symptoms and severity of depression over the course of the last two weeks. Rachel Dunlap obtained a score of *** suggesting {GBPHQ9SEVERITY:21752}. Rachel Dunlap finds the endorsed symptoms to be {gbphq9difficulty:21754}.  The Generalized Anxiety Disorder-7 (GAD-7) is a brief self-report measure that assesses symptoms of anxiety over the course of the last two weeks. Rachel Dunlap obtained a score of *** suggesting {gbgad7severity:21753}.  Interventions: Rachel Dunlap was administered the PHQ-9 and GAD-7 for symptom monitoring. Content from the last session was reviewed. Throughout today's session, empathic reflections and validation were provided. Psychoeducation regarding *** was provided and *** [insert other interventions].   DSM-5 Diagnosis: 311 (F32.8) Other Specified Depressive Disorder, Emotional Eating  Treatment Goal & Progress: Rachel Dunlap was seen for an initial appointment with this provider on January 26, 2018 during which the following treatment goal was established: decrease emotional eating. Rachel Dunlap has demonstrated progress in her goal of decreasing emotional eating as evidenced by ***  Plan: Rachel Dunlap continues to appear able and willing to participate as evidenced by engagement in reciprocal conversation, and asking questions for clarification as appropriate.*** The next appointment will be scheduled in {gbweeks:21758}. The next session will focus on reviewing learned skills, and working towards the established treatment goal.***

## 2018-02-01 NOTE — Progress Notes (Signed)
Office: [phone]  /  Fax: [phone]   HPI:   Chief Complaint: OBESITY Rachel Dunlap is here to discuss her progress with her obesity treatment plan. She is on the  follow the Category 3 plan and is following her eating plan approximately 80 % of the time. She states she is exercising 0 minutes 0 times per week. Ethelle states she is "trying to eat everything". She feels it is the hardest trying not to eat potatoes. She reports cravings for fried foods. She denies hunger.  Her weight is 252 lb (114.3 kg) today and has had a weight loss of 2 pounds over a period of 2 weeks since her last visit. She has lost 2 lbs since starting treatment with [place].  Hypertension Rachel Dunlap is a 22 y.o. female with hypertension.  Rachel Dunlap denies chest pain, headaches, or shortness of breath on exertion. She is working weight loss to help control her blood pressure with the goal of decreasing her risk of heart attack and stroke. Rachel Dunlap blood pressure is currently controlled. She is currently taking atenolol and enalapril.   Polycystic Ovarian Syndrome (Diagnosed in Highschool) Rachel Dunlap has a diagnosis of PCOS. She was diagnosed in highschool. She is currently taking Metformin.   Vitamin D deficiency Rachel Dunlap has a diagnosis of vitamin D deficiency. She is currently taking vit D and denies nausea, vomiting or muscle weakness.  Ref. Range 01/06/2018 13:21  Vitamin D, 25-Hydroxy Latest Ref Range: 30.0 - 100.0 ng/mL 43.7   Depression with emotional eating behaviors Rachel Dunlap is struggling with emotional eating and using food for comfort to the extent that it is negatively impacting her health. She often snacks when she is not hungry. Rachel Dunlap sometimes feels she is out of control and then feels guilty that she made poor food choices. She has been working on behavior modification techniques to help reduce her emotional eating and has been somewhat successful. She shows no sign of suicidal or homicidal ideations. She has an  appointment with Dr. Dewaine Conger, psychologist, yesterday.   Depression screen [facility] 2/9 01/26/2018 01/06/2018 11/30/2017 10/19/2017 03/20/2017  Decreased Interest 0 2 0 1 0  Down, Depressed, Hopeless 0 2 1 2  0  PHQ - 2 Score 0 4 1 3  0  Altered sleeping 0 0 1 2 -  Tired, decreased energy 1 2 1 2  -  Change in appetite 1 3 0 2 -  Feeling bad or failure about yourself  0 0 1 2 -  Trouble concentrating 0 1 0 2 -  Moving slowly or fidgety/restless 2 0 1 2 -  Suicidal thoughts 0 0 0 0 -  PHQ-9 Score 4 10 5 15  -  Difficult doing work/chores - Somewhat difficult Somewhat difficult Somewhat difficult -   At risk for diabetes Rachel Dunlap is at higher than averagerisk for developing diabetes due to her obesity. She currently denies polyuria or polydipsia.  ALLERGIES: No Known Allergies  MEDICATIONS: Current Outpatient Medications on File Prior to Visit  Medication Sig Dispense Refill  . albuterol (PROVENTIL HFA;VENTOLIN HFA) 108 (90 Base) MCG/ACT inhaler Inhale 2 puffs into the lungs every 6 (six) hours as needed for wheezing or shortness of breath. 1 Inhaler 0  . APRI 0.15-30 MG-MCG tablet TAKE 1 TABLET BY MOUTH EVERY DAY 84 tablet 2  . atenolol (TENORMIN) 25 MG tablet TAKE 1 TABLET BY MOUTH EVERY EVENING FOR BP AND ANXIETY 90 tablet 1  . buPROPion (WELLBUTRIN XL) 150 MG 24 hr tablet Take 1 tablet (150 mg total) by  mouth daily. 90 tablet 0  . Cholecalciferol (VITAMIN D) 2000 UNITS CAPS Take 1 capsule by mouth daily.    . cyclobenzaprine (FLEXERIL) 5 MG tablet Take 1 tablet (5 mg total) by mouth 3 (three) times daily as needed for muscle spasms. 15 tablet 0  . desoximetasone (TOPICORT) 0.25 % cream Apply 1 application 2 (two) times daily topically. 100 g 0  . enalapril (VASOTEC) 5 MG tablet Take 1 tablet by mouth daily.    . fluticasone (FLONASE) 50 MCG/ACT nasal spray Place 2 sprays into both nostrils at bedtime. 48 g 1  . ibuprofen (ADVIL,MOTRIN) 800 MG tablet Take 1 tablet (800 mg total) by mouth every 8  (eight) hours as needed. 30 tablet 0  . ketoconazole (NIZORAL) 2 % cream Apply 1 application topically at bedtime. 60 g 0  . linaclotide (LINZESS) 72 MCG capsule Take 1 capsule (72 mcg total) daily before breakfast by mouth. 90 capsule 1  . loratadine (CLARITIN) 10 MG tablet TAKE 1 TABLET BY MOUTH DAILY 30 tablet 3  . metFORMIN (GLUCOPHAGE) 500 MG tablet Take 1 tablet (500 mg total) by mouth daily with breakfast. 90 tablet 1  . montelukast (SINGULAIR) 10 MG tablet Take 1 tablet (10 mg total) by mouth at bedtime. 90 tablet 1  . valACYclovir (VALTREX) 500 MG tablet TAKE 1 TABLET (500 MG TOTAL) BY MOUTH DAILY. AND TWICE DAILY ON OUTBREAKS 115 tablet 0  . Vitamin D, Ergocalciferol, (DRISDOL) 50000 units CAPS capsule Take 1 capsule (50,000 Units total) by mouth every 7 (seven) days. 12 capsule 0   No current facility-administered medications on file prior to visit.     PAST MEDICAL HISTORY: Past Medical History:  Diagnosis Date  . Acne   . Allergy   . Anxiety   . Asthma   . Chronic constipation   . Chronic dermatitis   . Constipation   . Depression   . Dermatitis   . Dysmetabolic syndrome   . Genital herpes   . Hypertension   . Irritable bowel syndrome without diarrhea   . Kidney disease   . Morbid obesity with BMI of 40.0-44.9, adult (HCC)   . Panic disorder   . PCOS (polycystic ovarian syndrome)   . Pre-diabetes   . Proteinuria   . Stomach ulcer   . Vitamin D deficiency     PAST SURGICAL HISTORY: No past surgical history on file.  SOCIAL HISTORY: Social History   Tobacco Use  . Smoking status: Never Smoker  . Smokeless tobacco: Never Used  Substance Use Topics  . Alcohol use: Yes    Alcohol/week: 0.0 standard drinks    Comment: seldom  . Drug use: No    FAMILY HISTORY: Family History  Problem Relation Age of Onset  . Hypertension Mother   . Obesity Mother   . Cancer Father   . Diabetes Father   . Kidney disease Father   . Obesity Maternal Grandmother      ROS: Review of Systems  Constitutional: Positive for weight loss.  Respiratory: Negative for shortness of breath.   Cardiovascular: Negative for chest pain.  Gastrointestinal: Negative for nausea and vomiting.  Musculoskeletal:       Negative for muscle weakness  Neurological: Negative for headaches.  Endo/Heme/Allergies: Negative for polydipsia.       Negative for hypoglycemia and polyuria  Psychiatric/Behavioral: Positive for depression. Negative for suicidal ideas.       Negative for homicidal ideations     PHYSICAL EXAM: Blood pressure 131/79, pulse  75, temperature 97.9 F (36.6 C), temperature source Oral, height 5\' 7"  (1.702 m), weight 252 lb (114.3 kg), SpO2 99 %. Body mass index is 39.47 kg/m. Physical Exam  Constitutional: She is oriented to person, place, and time. She appears well-developed and well-nourished.  HENT:  Head: Normocephalic.  Neck: Normal range of motion.  Cardiovascular: Normal rate.  Pulmonary/Chest: Effort normal.  Musculoskeletal: Normal range of motion.  Neurological: She is alert and oriented to person, place, and time.  Skin: Skin is warm and dry.  Psychiatric: She has a normal mood and affect. Her behavior is normal.  Vitals reviewed.   RECENT LABS AND TESTS: BMET    Component Value Date/Time   NA 138 10/19/2017 0950   K 3.9 10/19/2017 0950   CL 105 10/19/2017 0950   CO2 26 10/19/2017 0950   GLUCOSE 82 10/19/2017 0950   BUN 13 10/19/2017 0950   CREATININE 0.83 10/19/2017 0950   CALCIUM 9.2 10/19/2017 0950   GFRNONAA 99 10/19/2017 0950   GFRAA 115 10/19/2017 0950   Lab Results  Component Value Date   HGBA1C 5.7 (H) 10/19/2017   HGBA1C 5.5 03/13/2016   No results found for: INSULIN CBC    Component Value Date/Time   WBC 9.0 10/19/2017 0950   RBC 4.60 10/19/2017 0950   HGB 12.4 10/19/2017 0950   HCT 37.5 10/19/2017 0950   PLT 243 10/19/2017 0950   MCV 81.5 10/19/2017 0950   MCH 27.0 10/19/2017 0950   MCHC 33.1  10/19/2017 0950   RDW 12.7 10/19/2017 0950   LYMPHSABS 2,448 10/19/2017 0950   MONOABS 935 07/21/2016 1215   EOSABS 297 10/19/2017 0950   BASOSABS 36 10/19/2017 0950   Iron/TIBC/Ferritin/ %Sat No results found for: IRON, TIBC, FERRITIN, IRONPCTSAT Lipid Panel     Component Value Date/Time   CHOL 182 10/19/2017 0950   TRIG 117 10/19/2017 0950   HDL 58 10/19/2017 0950   CHOLHDL 3.1 10/19/2017 0950   VLDL 20 03/13/2016 0001   LDLCALC 103 (H) 10/19/2017 0950   Hepatic Function Panel     Component Value Date/Time   PROT 7.3 10/19/2017 0950   ALBUMIN 3.8 03/13/2016 0001   AST 15 10/19/2017 0950   ALT 11 10/19/2017 0950   ALKPHOS 55 03/13/2016 0001   BILITOT 0.3 10/19/2017 0950      Component Value Date/Time   TSH 1.43 10/19/2017 0950    Ref. Range 01/06/2018 13:21  Vitamin D, 25-Hydroxy Latest Ref Range: 30.0 - 100.0 ng/mL 43.7    ASSESSMENT AND PLAN: Essential hypertension  PCOS (polycystic ovarian syndrome)  Prediabetes  Vitamin D deficiency  Other depression - with emotional eating  At risk for diabetes mellitus  Class 2 severe obesity with serious comorbidity and body mass index (BMI) of 39.0 to 39.9 in adult, unspecified obesity type (HCC)  PLAN: Hypertension We discussed sodium restriction, working on healthy weight loss, and a regular exercise program as the means to achieve improved blood pressure control. [place] agreed with this plan and agreed to follow up as directed. We will continue to monitor her blood pressure as well as her progress with the above lifestyle modifications. She will continue her medications as prescribed and will watch for signs of hypotension as she continues her lifestyle modifications. Agrees to follow up with our clinic as directed.   Polycystic Ovarian Syndrome (Diagnosed in highschool) We discussed PCOS and insulin resistance. She agrees to continue taking Metformin as prescribed by her PCP. Agrees to follow up with our  clinic as  directed.    Pre-Diabetes Rachel Dunlap will continue to work on weight loss, exercise, and decreasing simple carbohydrates in her diet to help decrease the risk of diabetes. We dicussed metformin including benefits and risks. She was informed that eating too many simple carbohydrates or too many calories at one sitting increases the likelihood of GI side effects. Rachel Dunlap agrees to continue Metformin as prescribed by PCP.  Rachel Dunlap agreed to follow up with [place] as directed to monitor her progress.  Vitamin D Deficiency Rachel Dunlap was informed that low vitamin D levels contributes to fatigue and are associated with obesity, breast, and colon cancer. She agrees to continue to take prescription Vit D @50 ,000 IU every week per PCP, discussed finishing her prescription then starting vit D 2,000 IU daily OTC. She will follow up for routine testing of vitamin D, at least 2-3 times per year. She was informed of the risk of over-replacement of vitamin D and agrees to not increase her dose unless she discusses this with [place] first. Agrees to follow up with our clinic as directed.   Depression with Emotional Eating Behaviors We discussed behavior modification techniques today to help Rachel Dunlap deal with her emotional eating and depression. She had a visit with Dr. Dewaine Conger, psychologist, yesterday and has a follow up appointment in 2 weeks.   Diabetes risk counseling Rachel Dunlap was given extended (15 minutes) diabetes prevention counseling today. She is 22 y.o. female and has risk factors for diabetes including obesity. We discussed intensive lifestyle modifications today with an emphasis on weight loss as well as increasing exercise and decreasing simple carbohydrates in her diet.  Obesity Rachel Dunlap is currently in the action stage of change. As such, her goal is to continue with weight loss efforts She has agreed to follow the Category 3 plan  Discussed continuing to meal plan and decrease carbohydrates and increase protein.  Rachel Dunlap has been  instructed to work up to a goal of 150 minutes of combined cardio and strengthening exercise per week for weight loss and overall health benefits. We discussed the following Behavioral Modification Strategies today: increasing vegetables, decrease eating out, increasing water intake, no skipping meals, and work on meal planning and easy cooking plans.   Rachel Dunlap has agreed to follow up with our clinic in 2 weeks. She was informed of the importance of frequent follow up visits to maximize her success with intensive lifestyle modifications for her multiple health conditions.   OBESITY BEHAVIORAL INTERVENTION VISIT  Today's visit was # 2   Starting weight: 254 lb Starting date: 01/06/18 Today's weight : 252 lb Today's date: 01/27/18 Total lbs lost to date: 2 lb    ASK: We discussed the diagnosis of obesity with Rachel Dunlap today and Rachel Dunlap agreed to give [place] permission to discuss obesity behavioral modification therapy today.  ASSESS: Rachel Dunlap has the diagnosis of obesity and her BMI today is 39.46 Rachel Dunlap is in the action stage of change   ADVISE: Rachel Dunlap was educated on the multiple health risks of obesity as well as the benefit of weight loss to improve her health. She was advised of the need for long term treatment and the importance of lifestyle modifications to improve her current health and to decrease her risk of future health problems.  AGREE: Multiple dietary modification options and treatment options were discussed and  Rachel Dunlap agreed to follow the recommendations documented in the above note.  ARRANGE: Rachel Dunlap was educated on the importance of frequent visits to treat obesity as outlined per CMS  and USPSTF guidelines and agreed to schedule her next follow up appointment today.  Otis Peak, am acting as transcriptionist for [employer], DO   I have reviewed the above documentation for accuracy and completeness, and I agree with the above. -Corinna Capra, DO

## 2018-02-08 DIAGNOSIS — F321 Major depressive disorder, single episode, moderate: Secondary | ICD-10-CM | POA: Diagnosis not present

## 2018-02-09 ENCOUNTER — Ambulatory Visit (INDEPENDENT_AMBULATORY_CARE_PROVIDER_SITE_OTHER): Payer: Self-pay | Admitting: Psychology

## 2018-02-10 ENCOUNTER — Ambulatory Visit (INDEPENDENT_AMBULATORY_CARE_PROVIDER_SITE_OTHER): Payer: Federal, State, Local not specified - PPO | Admitting: Bariatrics

## 2018-02-10 ENCOUNTER — Encounter (INDEPENDENT_AMBULATORY_CARE_PROVIDER_SITE_OTHER): Payer: Self-pay | Admitting: Bariatrics

## 2018-02-10 VITALS — BP 139/86 | HR 84 | Temp 98.0°F | Ht 67.0 in | Wt 249.0 lb

## 2018-02-10 DIAGNOSIS — I10 Essential (primary) hypertension: Secondary | ICD-10-CM

## 2018-02-10 DIAGNOSIS — R7303 Prediabetes: Secondary | ICD-10-CM

## 2018-02-10 DIAGNOSIS — F3289 Other specified depressive episodes: Secondary | ICD-10-CM

## 2018-02-10 DIAGNOSIS — Z6839 Body mass index (BMI) 39.0-39.9, adult: Secondary | ICD-10-CM

## 2018-02-14 DIAGNOSIS — R7303 Prediabetes: Secondary | ICD-10-CM | POA: Insufficient documentation

## 2018-02-14 DIAGNOSIS — Z6839 Body mass index (BMI) 39.0-39.9, adult: Secondary | ICD-10-CM

## 2018-02-14 DIAGNOSIS — I10 Essential (primary) hypertension: Secondary | ICD-10-CM | POA: Insufficient documentation

## 2018-02-14 NOTE — Progress Notes (Signed)
Office: [phone]  /  Fax: [phone]   HPI:   Chief Complaint: OBESITY Rachel Dunlap is here to discuss her progress with her obesity treatment plan. She is on the Category 3 plan and is following her eating plan approximately 80 % of the time. She states she is exercising 0 minutes 0 times per week. Rachel Dunlap is not struggling since her last visit. Her hunger is controlled. Her weight is 249 lb (112.9 kg) today and has had a weight loss of 3 pounds over a period of 2 weeks since her last visit. She has lost 5 lbs since starting treatment with [place].  Hypertension Rachel Dunlap is a 22 y.o. female with hypertension. She is currently taking Atenolol and Enalapril. Rachel Dunlap denies lightheadedness. She is working weight loss to help control her blood pressure with the goal of decreasing her risk of heart attack and stroke. Rachel Dunlap blood pressure is reasonably well controlled.  Pre-Diabetes Rachel Dunlap has a diagnosis of prediabetes based on her elevated HgA1c and was informed this puts her at greater risk of developing diabetes. She is taking metformin currently and continues to work on diet and exercise to decrease risk of diabetes. She denies nausea or hypoglycemia.   Depression with emotional eating behaviors Rachel Dunlap is seeing Dr. Dewaine Conger our bariatric psychologist (helping) and she is currently taking Bupropion 150 mg daily. She struggles with emotional eating and using food for comfort to the extent that it is negatively impacting her health. She often snacks when she is not hungry. Rachel Dunlap sometimes feels she is out of control and then feels guilty that she made poor food choices. She has been working on behavior modification techniques to help reduce her emotional eating and has been somewhat successful. She shows no sign of suicidal or homicidal ideations.  Depression screen Rachel Dunlap 2/9 01/26/2018 01/06/2018 11/30/2017 10/19/2017 03/20/2017  Decreased Interest 0 2 0 1 0  Down, Depressed, Hopeless 0 2 1 2  0    PHQ - 2 Score 0 4 1 3  0  Altered sleeping 0 0 1 2 -  Tired, decreased energy 1 2 1 2  -  Change in appetite 1 3 0 2 -  Feeling bad or failure about yourself  0 0 1 2 -  Trouble concentrating 0 1 0 2 -  Moving slowly or fidgety/restless 2 0 1 2 -  Suicidal thoughts 0 0 0 0 -  PHQ-9 Score 4 10 5 15  -  Difficult doing work/chores - Somewhat difficult Somewhat difficult Somewhat difficult -     ALLERGIES: No Known Allergies  MEDICATIONS: Current Outpatient Medications on File Prior to Visit  Medication Sig Dispense Refill  . albuterol (PROVENTIL HFA;VENTOLIN HFA) 108 (90 Base) MCG/ACT inhaler Inhale 2 puffs into the lungs every 6 (six) hours as needed for wheezing or shortness of breath. 1 Inhaler 0  . APRI 0.15-30 MG-MCG tablet TAKE 1 TABLET BY MOUTH EVERY DAY 84 tablet 2  . atenolol (TENORMIN) 25 MG tablet TAKE 1 TABLET BY MOUTH EVERY EVENING FOR BP AND ANXIETY 90 tablet 1  . buPROPion (WELLBUTRIN XL) 150 MG 24 hr tablet Take 1 tablet (150 mg total) by mouth daily. 90 tablet 0  . Cholecalciferol (VITAMIN D) 2000 UNITS CAPS Take 1 capsule by mouth daily.    . cyclobenzaprine (FLEXERIL) 5 MG tablet Take 1 tablet (5 mg total) by mouth 3 (three) times daily as needed for muscle spasms. 15 tablet 0  . desoximetasone (TOPICORT) 0.25 % cream Apply 1 application 2 (two) times  daily topically. 100 g 0  . enalapril (VASOTEC) 5 MG tablet Take 1 tablet by mouth daily.    . fluticasone (FLONASE) 50 MCG/ACT nasal spray Place 2 sprays into both nostrils at bedtime. 48 g 1  . ibuprofen (ADVIL,MOTRIN) 800 MG tablet Take 1 tablet (800 mg total) by mouth every 8 (eight) hours as needed. 30 tablet 0  . ketoconazole (NIZORAL) 2 % cream Apply 1 application topically at bedtime. 60 g 0  . linaclotide (LINZESS) 72 MCG capsule Take 1 capsule (72 mcg total) daily before breakfast by mouth. 90 capsule 1  . loratadine (CLARITIN) 10 MG tablet TAKE 1 TABLET BY MOUTH DAILY 30 tablet 3  . metFORMIN (GLUCOPHAGE) 500  MG tablet Take 1 tablet (500 mg total) by mouth daily with breakfast. 90 tablet 1  . montelukast (SINGULAIR) 10 MG tablet Take 1 tablet (10 mg total) by mouth at bedtime. 90 tablet 1  . valACYclovir (VALTREX) 500 MG tablet TAKE 1 TABLET (500 MG TOTAL) BY MOUTH DAILY. AND TWICE DAILY ON OUTBREAKS 115 tablet 0  . Vitamin D, Ergocalciferol, (DRISDOL) 50000 units CAPS capsule Take 1 capsule (50,000 Units total) by mouth every 7 (seven) days. 12 capsule 0   No current facility-administered medications on file prior to visit.     PAST MEDICAL HISTORY: Past Medical History:  Diagnosis Date  . Acne   . Allergy   . Anxiety   . Asthma   . Chronic constipation   . Chronic dermatitis   . Constipation   . Depression   . Dermatitis   . Dysmetabolic syndrome   . Genital herpes   . Hypertension   . Irritable bowel syndrome without diarrhea   . Kidney disease   . Morbid obesity with BMI of 40.0-44.9, adult (HCC)   . Panic disorder   . PCOS (polycystic ovarian syndrome)   . Pre-diabetes   . Proteinuria   . Stomach ulcer   . Vitamin D deficiency     PAST SURGICAL HISTORY: No past surgical history on file.  SOCIAL HISTORY: Social History   Tobacco Use  . Smoking status: Never Smoker  . Smokeless tobacco: Never Used  Substance Use Topics  . Alcohol use: Yes    Alcohol/week: 0.0 standard drinks    Comment: seldom  . Drug use: No    FAMILY HISTORY: Family History  Problem Relation Age of Onset  . Hypertension Mother   . Obesity Mother   . Cancer Father   . Diabetes Father   . Kidney disease Father   . Obesity Maternal Grandmother     ROS: Review of Systems  Constitutional: Positive for weight loss.  Neurological:       Negative for lightheadedness  Psychiatric/Behavioral: Positive for depression. Negative for suicidal ideas.    PHYSICAL EXAM: Blood pressure 139/86, pulse 84, temperature 98 F (36.7 C), temperature source Oral, height 5\' 7"  (1.702 m), weight 249 lb  (112.9 kg), SpO2 99 %. Body mass index is 39 kg/m. Physical Exam  Constitutional: She is oriented to person, place, and time. She appears well-developed and well-nourished.  Cardiovascular: Normal rate.  Pulmonary/Chest: Effort normal.  Musculoskeletal: Normal range of motion.  Neurological: She is oriented to person, place, and time.  Skin: Skin is warm and dry.  Psychiatric: She has a normal mood and affect. Her behavior is normal. She expresses no homicidal and no suicidal ideation.  Vitals reviewed.   RECENT LABS AND TESTS: BMET    Component Value Date/Time  NA 138 10/19/2017 0950   K 3.9 10/19/2017 0950   CL 105 10/19/2017 0950   CO2 26 10/19/2017 0950   GLUCOSE 82 10/19/2017 0950   BUN 13 10/19/2017 0950   CREATININE 0.83 10/19/2017 0950   CALCIUM 9.2 10/19/2017 0950   GFRNONAA 99 10/19/2017 0950   GFRAA 115 10/19/2017 0950   Lab Results  Component Value Date   HGBA1C 5.7 (H) 10/19/2017   HGBA1C 5.5 03/13/2016   No results found for: INSULIN CBC    Component Value Date/Time   WBC 9.0 10/19/2017 0950   RBC 4.60 10/19/2017 0950   HGB 12.4 10/19/2017 0950   HCT 37.5 10/19/2017 0950   PLT 243 10/19/2017 0950   MCV 81.5 10/19/2017 0950   MCH 27.0 10/19/2017 0950   MCHC 33.1 10/19/2017 0950   RDW 12.7 10/19/2017 0950   LYMPHSABS 2,448 10/19/2017 0950   MONOABS 935 07/21/2016 1215   EOSABS 297 10/19/2017 0950   BASOSABS 36 10/19/2017 0950   Iron/TIBC/Ferritin/ %Sat No results found for: IRON, TIBC, FERRITIN, IRONPCTSAT Lipid Panel     Component Value Date/Time   CHOL 182 10/19/2017 0950   TRIG 117 10/19/2017 0950   HDL 58 10/19/2017 0950   CHOLHDL 3.1 10/19/2017 0950   VLDL 20 03/13/2016 0001   LDLCALC 103 (H) 10/19/2017 0950   Hepatic Function Panel     Component Value Date/Time   PROT 7.3 10/19/2017 0950   ALBUMIN 3.8 03/13/2016 0001   AST 15 10/19/2017 0950   ALT 11 10/19/2017 0950   ALKPHOS 55 03/13/2016 0001   BILITOT 0.3 10/19/2017 0950       Component Value Date/Time   TSH 1.43 10/19/2017 0950   Results for LAKETTA, SODERBERG (MRN [id number]) as of 02/14/2018 14:39  Ref. Range 01/06/2018 13:21  Vitamin D, 25-Hydroxy Latest Ref Range: 30.0 - 100.0 ng/mL 43.7   ASSESSMENT AND PLAN: Essential hypertension  Prediabetes  Other depression - with emotional eating  Class 2 severe obesity with serious comorbidity and body mass index (BMI) of 39.0 to 39.9 in adult, unspecified obesity type (HCC)  PLAN:  Hypertension We discussed sodium restriction, working on healthy weight loss, and a regular exercise program as the means to achieve improved blood pressure control. De agreed with this plan and agreed to follow up as directed. We will continue to monitor her blood pressure as well as her progress with the above lifestyle modifications. She will continue Atenolol and Enalapril and will watch for signs of hypotension as she continues her lifestyle modifications.  Depression with Emotional Eating Behaviors We discussed behavior modification techniques today to help Rachel Dunlap deal with her emotional eating and depression. She will continue to take Bupropion (Wellbutrin SR) 150 mg qd and follow up as directed.  I spent > than 50% of the 15 minute visit on counseling as documented in the note.  Pre-Diabetes Rachel Dunlap will continue to work on weight loss, exercise, and decreasing simple carbohydrates in her diet to help decrease the risk of diabetes. We dicussed metformin including benefits and risks. She was informed that eating too many simple carbohydrates or too many calories at one sitting increases the likelihood of GI side effects. Rachel Dunlap is taking metformin but she does not need a prescription  written today. Rachel Dunlap agreed to follow up with [place] as directed to monitor her progress.   Obesity Rachel Dunlap is currently in the action stage of change. As such, her goal is to continue with weight loss efforts She has agreed to follow  the Category 3  plan Rachel Dunlap has been instructed to work up to a goal of 150 minutes of combined cardio and strengthening exercise per week for weight loss and overall health benefits. We discussed the following Behavioral Modification Strategies today: increase H2O intake, no skipping meals, increasing lean protein intake, decreasing simple carbohydrates , increasing vegetables, work on meal planning and easy cooking plans and Thanksgiving eating strategies   Rachel Dunlap has agreed to follow up with our clinic in 2 weeks. She was informed of the importance of frequent follow up visits to maximize her success with intensive lifestyle modifications for her multiple health conditions.   OBESITY BEHAVIORAL INTERVENTION VISIT  Today's visit was # 3   Starting weight: 254 lbs Starting date: 01/06/2018 Today's weight : 249 lbs  Today's date: 02/10/2018 Total lbs lost to date: 5   ASK: We discussed the diagnosis of obesity with Rachel Dunlap today and Rachel Dunlap agreed to give [place] permission to discuss obesity behavioral modification therapy today.  ASSESS: Rachel Dunlap has the diagnosis of obesity and her BMI today is 38.99 Rachel Dunlap is in the action stage of change   ADVISE: Neshia was educated on the multiple health risks of obesity as well as the benefit of weight loss to improve her health. She was advised of the need for long term treatment and the importance of lifestyle modifications to improve her current health and to decrease her risk of future health problems.  AGREE: Multiple dietary modification options and treatment options were discussed and  Ireanna agreed to follow the recommendations documented in the above note.  ARRANGE: Justus was educated on the importance of frequent visits to treat obesity as outlined per CMS and USPSTF guidelines and agreed to schedule her next follow up appointment today.  Cristi Loron, am acting as Energy manager for [employer]. Manson Passey, DO  I have reviewed the above documentation for  accuracy and completeness, and I agree with the above. -Corinna Capra, DO

## 2018-02-16 ENCOUNTER — Encounter (INDEPENDENT_AMBULATORY_CARE_PROVIDER_SITE_OTHER): Payer: Self-pay

## 2018-02-22 DIAGNOSIS — F321 Major depressive disorder, single episode, moderate: Secondary | ICD-10-CM | POA: Diagnosis not present

## 2018-02-24 ENCOUNTER — Encounter (INDEPENDENT_AMBULATORY_CARE_PROVIDER_SITE_OTHER): Payer: Self-pay | Admitting: Bariatrics

## 2018-02-24 ENCOUNTER — Encounter (INDEPENDENT_AMBULATORY_CARE_PROVIDER_SITE_OTHER): Payer: Self-pay

## 2018-02-24 ENCOUNTER — Ambulatory Visit (INDEPENDENT_AMBULATORY_CARE_PROVIDER_SITE_OTHER): Payer: Federal, State, Local not specified - PPO | Admitting: Bariatrics

## 2018-02-24 ENCOUNTER — Ambulatory Visit (INDEPENDENT_AMBULATORY_CARE_PROVIDER_SITE_OTHER): Payer: Self-pay | Admitting: Psychology

## 2018-02-24 VITALS — BP 120/75 | HR 79 | Temp 98.2°F | Ht 67.0 in | Wt 248.0 lb

## 2018-02-24 DIAGNOSIS — Z6838 Body mass index (BMI) 38.0-38.9, adult: Secondary | ICD-10-CM | POA: Diagnosis not present

## 2018-02-24 DIAGNOSIS — F3289 Other specified depressive episodes: Secondary | ICD-10-CM

## 2018-02-24 DIAGNOSIS — E669 Obesity, unspecified: Secondary | ICD-10-CM

## 2018-02-24 DIAGNOSIS — E559 Vitamin D deficiency, unspecified: Secondary | ICD-10-CM | POA: Diagnosis not present

## 2018-02-28 ENCOUNTER — Ambulatory Visit (INDEPENDENT_AMBULATORY_CARE_PROVIDER_SITE_OTHER): Payer: Federal, State, Local not specified - PPO | Admitting: Psychology

## 2018-02-28 NOTE — Progress Notes (Unsigned)
Office: [phone]  /  Fax: [phone]   Date: February 28, 2018   Time Seen:*** Duration:*** Provider: Lawerance CruelGaytri Barker, Psy.D. Type of Session: Individual Therapy   HPI: Rachel Dunlap was referred by Dr. Corinna CapraAngel Brown due to depression with emotional eating behaviors, and was seen for an initial appointment by this provider on January 26, 2018. Per the note for the initial visit with Dr. Corinna CapraAngel Brown on January 06, 2018, "Cariasees a therapist and does not work on eating. Maaliyah struggleswith emotional eating and using food for comfort to the extent that it is negatively impacting herhealth. Sheoften snacks when sheis not hungry. Cariasometimes feels sheis out of control and then feels guilty that shemade poor food choices. Lavetta NielsenShehas been working on behavior modification techniques to help reduce heremotional eating and has been somewhat successful.Sheshows no sign of suicidal or homicidal ideations." Additionally, per the note for the initial visit with Dr. Corinna CapraAngel Brown on January 06, 2018, Rachel Dunlap has been heavy most of her life and she started gaining weight in the second grade. Her heaviest weight ever was 267 pounds. During the initial point with Dr. Manson PasseyBrown, Rachel Isaacsaria reported experiencing the following: Significant food cravings issues; skipping meals frequently; frequently drinking liquids with calories; frequently making poor food choices; having problems with excessive hunger; frequently eating larger portions than normal; and struggling with emotional eating.   During the initial appointment with this provider, Bemnet reported, "I'm really an emotional eater. My father passed away last year." She added her brother passed away two to three months after her father passed away. In addition, [place] indicated she is working toward graduating in December of this year. She stated she tends to crave salty foods. Euretha indicated emotional eating started in sixth grade. She noted in elementary school she explained  a boy she liked called her "ugly" and she believes that precipitated the emotional eating. She denied a history of binge eating. Latha denied a history of purging and engagement in other compensatory strategies. Avryl denied ever being diagnosed with an eating disorder. Furthermore, Rachel Dunlap was asked to complete a questionnaire assessing various behaviors related to emotional eating. Chrisann endorsed the following: overeat when you are celebrating, experience food cravings on a regular basis, eat certain foods when you are anxious, stressed, depressed, or your feelings are hurt, use food to help you cope with emotional situations, find food is comforting to you, overeat when you are angry or upset, overeat when you are worried about something, not worry about what you eat when you are in a good mood, eat to help you stay awake and eat as a reward.  Session Content: Session focused on the following treatment goal: decrease emotional eating. The session was initiated with the administration of the PHQ-9 and GAD-7, as well as a brief check-in.   Rachel Dunlap was receptive to today's session as evidenced by ***.  Mental Status Examination: Sherial arrived on time for the appointment. She presented as appropriately dressed and groomed. Rachel Dunlap appeared her stated age and demonstrated adequate orientation to time, place, person, and purpose of the appointment. She also demonstrated appropriate eye contact. No psychomotor abnormalities or behavioral peculiarities noted. Her mood was {gbmood:21757} with congruent affect. Her thought processes were logical, linear, and goal-directed. No hallucinations, delusions, bizarre thinking or behavior reported or observed. Judgment, insight, and impulse control appeared to be grossly intact. There was no evidence of paraphasias (i.e., errors in speech, gross mispronunciations, and word substitutions), repetition deficits, or disturbances in volume or prosody (i.e., rhythm and intonation).  There was no evidence of attention or memory impairments. Cindia denied current suicidal and homicidal ideation, intent or plan.  Structured Assessment Results: The Patient Health Questionnaire-9 (PHQ-9) is a self-report measure that assesses symptoms and severity of depression over the course of the last two weeks. Ty obtained a score of *** suggesting {GBPHQ9SEVERITY:21752}. Kashmere finds the endorsed symptoms to be {gbphq9difficulty:21754}.  The Generalized Anxiety Disorder-7 (GAD-7) is a brief self-report measure that assesses symptoms of anxiety over the course of the last two weeks. Tasmine obtained a score of *** suggesting {gbgad7severity:21753}.  Interventions: Kennidy was administered the PHQ-9 and GAD-7 for symptom monitoring. Content from the last session was reviewed. Throughout today's session, empathic reflections and validation were provided. Psychoeducation regarding *** was provided and *** [insert other interventions].   DSM-5 Diagnosis: 311 (F32.8) Other Specified Depressive Disorder, Emotional Eating  Treatment Goal & Progress: Ezelle was seen for an initial appointment with this provider on January 26, 2018 during which the following treatment goal was established: decrease emotional eating. Baylin has demonstrated progress in her goal of decreasing emotional eating as evidenced by ***  Plan: Kloie continues to appear able and willing to participate as evidenced by engagement in reciprocal conversation, and asking questions for clarification as appropriate.*** The next appointment will be scheduled in {gbweeks:21758}. The next session will focus on reviewing learned skills, and working towards the established treatment goal.***

## 2018-03-01 ENCOUNTER — Encounter (INDEPENDENT_AMBULATORY_CARE_PROVIDER_SITE_OTHER): Payer: Self-pay | Admitting: Bariatrics

## 2018-03-01 NOTE — Progress Notes (Signed)
Office: [phone]  /  Fax: [phone]   HPI:   Chief Complaint: OBESITY Demetrios IsaacsCaria is here to discuss her progress with her obesity treatment plan. She is on the Category 3 plan and is following her eating plan approximately 50 % of the time. She states she is exercising 0 minutes 0 times per week. Demetrios IsaacsCaria has struggled slightly with the plan due to going to the hospital (family member).  Her weight is 248 lb (112.5 kg) today and has had a weight loss of 4 pounds over a period of 2 weeks since her last visit. She has lost 6 lbs since starting treatment with us.  Vitamin D deficiency Demetrios IsaacsCaria has a diagnosis of vitamin D deficiency. She is taking high dose prescription Vit D and denies nausea, vomiting or muscle weakness.  Depression with emotional eating behaviors Demetrios IsaacsCaria is taking bupropion and she denies side effects. Demetrios IsaacsCaria struggles with emotional eating and using food for comfort to the extent that it is negatively impacting her health. She often snacks when she is not hungry. Anihya sometimes feels she is out of control and then feels guilty that she made poor food choices. She has been working on behavior modification techniques to help reduce her emotional eating and has been somewhat successful. She shows no sign of suicidal or homicidal ideations.  [facility] 01/26/2018 01/06/2018 11/30/2017 10/19/2017 03/20/2017  Decreased Interest 0 2 0 1 0  Down, Depressed, Hopeless 0 2 1 2  0  PHQ - 2 Score 0 4 1 3  0  Altered sleeping 0 0 1 2 -  Tired, decreased energy 1 2 1 2  -  Change in appetite 1 3 0 2 -  Feeling bad or failure about yourself  0 0 1 2 -  Trouble concentrating 0 1 0 2 -  Moving slowly or fidgety/restless 2 0 1 2 -  Suicidal thoughts 0 0 0 0 -  PHQ-9 Score 4 10 5 15  -  Difficult doing work/chores - Somewhat difficult Somewhat difficult Somewhat difficult -    ALLERGIES: No Known Allergies  MEDICATIONS: Current Outpatient Medications on File Prior to Visit    Medication Sig Dispense Refill  . albuterol (PROVENTIL HFA;VENTOLIN HFA) 108 (90 Base) MCG/ACT inhaler Inhale 2 puffs into the lungs every 6 (six) hours as needed for wheezing or shortness of breath. 1 Inhaler 0  . APRI 0.15-30 MG-MCG tablet TAKE 1 TABLET BY MOUTH EVERY DAY 84 tablet 2  . atenolol (TENORMIN) 25 MG tablet TAKE 1 TABLET BY MOUTH EVERY EVENING FOR BP AND ANXIETY 90 tablet 1  . buPROPion (WELLBUTRIN XL) 150 MG 24 hr tablet Take 1 tablet (150 mg total) by mouth daily. 90 tablet 0  . Cholecalciferol (VITAMIN D) 2000 UNITS CAPS Take 1 capsule by mouth daily.    . cyclobenzaprine (FLEXERIL) 5 MG tablet Take 1 tablet (5 mg total) by mouth 3 (three) times daily as needed for muscle spasms. 15 tablet 0  . desoximetasone (TOPICORT) 0.25 % cream Apply 1 application 2 (two) times daily topically. 100 g 0  . enalapril (VASOTEC) 5 MG tablet Take 1 tablet by mouth daily.    . fluticasone (FLONASE) 50 MCG/ACT nasal spray Place 2 sprays into both nostrils at bedtime. 48 g 1  . ibuprofen (ADVIL,MOTRIN) 800 MG tablet Take 1 tablet (800 mg total) by mouth every 8 (eight) hours as needed. 30 tablet 0  . ketoconazole (NIZORAL) 2 % cream Apply 1 application topically at bedtime. 60 g 0  .  linaclotide (LINZESS) 72 MCG capsule Take 1 capsule (72 mcg total) daily before breakfast by mouth. 90 capsule 1  . loratadine (CLARITIN) 10 MG tablet TAKE 1 TABLET BY MOUTH DAILY 30 tablet 3  . metFORMIN (GLUCOPHAGE) 500 MG tablet Take 1 tablet (500 mg total) by mouth daily with breakfast. 90 tablet 1  . montelukast (SINGULAIR) 10 MG tablet Take 1 tablet (10 mg total) by mouth at bedtime. 90 tablet 1  . valACYclovir (VALTREX) 500 MG tablet TAKE 1 TABLET (500 MG TOTAL) BY MOUTH DAILY. AND TWICE DAILY ON OUTBREAKS 115 tablet 0  . Vitamin D, Ergocalciferol, (DRISDOL) 50000 units CAPS capsule Take 1 capsule (50,000 Units total) by mouth every 7 (seven) days. 12 capsule 0   No current facility-administered medications on  file prior to visit.     PAST MEDICAL HISTORY: Past Medical History:  Diagnosis Date  . Acne   . Allergy   . Anxiety   . Asthma   . Chronic constipation   . Chronic dermatitis   . Constipation   . Depression   . Dermatitis   . Dysmetabolic syndrome   . Genital herpes   . Hypertension   . Irritable bowel syndrome without diarrhea   . Kidney disease   . Morbid obesity with BMI of 40.0-44.9, adult (HCC)   . Panic disorder   . PCOS (polycystic ovarian syndrome)   . Pre-diabetes   . Proteinuria   . Stomach ulcer   . Vitamin D deficiency     PAST SURGICAL HISTORY: No past surgical history on file.  SOCIAL HISTORY: Social History   Tobacco Use  . Smoking status: Never Smoker  . Smokeless tobacco: Never Used  Substance Use Topics  . Alcohol use: Yes    Alcohol/week: 0.0 standard drinks    Comment: seldom  . Drug use: No    FAMILY HISTORY: Family History  Problem Relation Age of Onset  . Hypertension Mother   . Obesity Mother   . Cancer Father   . Diabetes Father   . Kidney disease Father   . Obesity Maternal Grandmother     ROS: Review of Systems  Constitutional: Positive for weight loss.  Gastrointestinal: Negative for nausea and vomiting.  Musculoskeletal:       Negative muscle weakness  Psychiatric/Behavioral: Positive for depression. Negative for suicidal ideas.    PHYSICAL EXAM: Blood pressure 120/75, pulse 79, temperature 98.2 F (36.8 C), temperature source Oral, height 5\' 7"  (1.702 m), weight 248 lb (112.5 kg), SpO2 99 %. Body mass index is 38.84 kg/m. Physical Exam  Constitutional: She is oriented to person, place, and time. She appears well-developed and well-nourished.  Cardiovascular: Normal rate.  Pulmonary/Chest: Effort normal.  Musculoskeletal: Normal range of motion.  Neurological: She is oriented to person, place, and time.  Skin: Skin is warm.  Psychiatric: She has a normal mood and affect. Her behavior is normal.  Vitals  reviewed.   RECENT LABS AND TESTS: BMET    Component Value Date/Time   NA 138 10/19/2017 0950   K 3.9 10/19/2017 0950   CL 105 10/19/2017 0950   CO2 26 10/19/2017 0950   GLUCOSE 82 10/19/2017 0950   BUN 13 10/19/2017 0950   CREATININE 0.83 10/19/2017 0950   CALCIUM 9.2 10/19/2017 0950   GFRNONAA 99 10/19/2017 0950   GFRAA 115 10/19/2017 0950   Lab Results  Component Value Date   HGBA1C 5.7 (H) 10/19/2017   HGBA1C 5.5 03/13/2016   No results found for: INSULIN  CBC    Component Value Date/Time   WBC 9.0 10/19/2017 0950   RBC 4.60 10/19/2017 0950   HGB 12.4 10/19/2017 0950   HCT 37.5 10/19/2017 0950   PLT 243 10/19/2017 0950   MCV 81.5 10/19/2017 0950   MCH 27.0 10/19/2017 0950   MCHC 33.1 10/19/2017 0950   RDW 12.7 10/19/2017 0950   LYMPHSABS 2,448 10/19/2017 0950   MONOABS 935 07/21/2016 1215   EOSABS 297 10/19/2017 0950   BASOSABS 36 10/19/2017 0950   Iron/TIBC/Ferritin/ %Sat No results found for: IRON, TIBC, FERRITIN, IRONPCTSAT Lipid Panel     Component Value Date/Time   CHOL 182 10/19/2017 0950   TRIG 117 10/19/2017 0950   HDL 58 10/19/2017 0950   CHOLHDL 3.1 10/19/2017 0950   VLDL 20 03/13/2016 0001   LDLCALC 103 (H) 10/19/2017 0950   Hepatic Function Panel     Component Value Date/Time   PROT 7.3 10/19/2017 0950   ALBUMIN 3.8 03/13/2016 0001   AST 15 10/19/2017 0950   ALT 11 10/19/2017 0950   ALKPHOS 55 03/13/2016 0001   BILITOT 0.3 10/19/2017 0950      Component Value Date/Time   TSH 1.43 10/19/2017 0950  Results for JANANN, BOEVE (MRN [id number]) as of 03/01/2018 14:34  Ref. Range 01/06/2018 13:21  Vitamin D, 25-Hydroxy Latest Ref Range: 30.0 - 100.0 ng/mL 43.7    ASSESSMENT AND PLAN: Vitamin D deficiency  Other depression - with emotional eating behavior  Class 2 obesity without serious comorbidity with body mass index (BMI) of 38.0 to 38.9 in adult, unspecified obesity type  PLAN:  Vitamin D Deficiency Ashtan was informed that  low vitamin D levels contributes to fatigue and are associated with obesity, breast, and colon cancer. Shaeley agrees to continue taking prescription Vit D @50 ,000 IU every week and will follow up for routine testing of vitamin D, at least 2-3 times per year. She was informed of the risk of over-replacement of vitamin D and agrees to not increase her dose unless she discusses this with [place] first. Katelen agrees to follow up with our clinic in 2 weeks.  Depression with Emotional Eating Behaviors We discussed behavior modification techniques today to help Amil deal with her emotional eating and depression. Ledora agrees to continue her medications and she agrees to follow up with our clinic in 2 weeks.  I spent > than 50% of the 15 minute visit on counseling as documented in the note.  Obesity Indya is currently in the action stage of change. As such, her goal is to continue with weight loss efforts She has agreed to follow the Category 3 plan Kerensa has been instructed to work up to a goal of 150 minutes of combined cardio and strengthening exercise per week for weight loss and overall health benefits. We discussed the following Behavioral Modification Strategies today: increasing lean protein intake, decreasing simple carbohydrates, increasing vegetables, decrease eating out, holiday eating strategies, celebration eating strategies, no skipping meals, and increase H20 intake "[employer] The Road" handout was given.  Sabrina has agreed to follow up with our clinic in 2 weeks. She was informed of the importance of frequent follow up visits to maximize her success with intensive lifestyle modifications for her multiple health conditions.   OBESITY BEHAVIORAL INTERVENTION VISIT  Today's visit was # 5   Starting weight: 254 lbs Starting date: 01/06/18 Today's weight : 248 lbs  Today's date: 02/24/2018 Total lbs lost to date: 6    ASK: We discussed the diagnosis of  obesity with Gladys Damme today and Kashari  agreed to give [place] permission to discuss obesity behavioral modification therapy today.  ASSESS: Faith has the diagnosis of obesity and her BMI today is 38.83 Dorothyann is in the action stage of change   ADVISE: Kahleah was educated on the multiple health risks of obesity as well as the benefit of weight loss to improve her health. She was advised of the need for long term treatment and the importance of lifestyle modifications to improve her current health and to decrease her risk of future health problems.  AGREE: Multiple dietary modification options and treatment options were discussed and  Jenniffer agreed to follow the recommendations documented in the above note.  ARRANGE: Makenleigh was educated on the importance of frequent visits to treat obesity as outlined per CMS and USPSTF guidelines and agreed to schedule her next follow up appointment today.  Trude Mcburney, am acting as transcriptionist for [employer], DO  I have reviewed the above documentation for accuracy and completeness, and I agree with the above. -Corinna Capra, DO

## 2018-03-02 ENCOUNTER — Ambulatory Visit: Payer: Federal, State, Local not specified - PPO | Admitting: Family Medicine

## 2018-03-02 ENCOUNTER — Encounter: Payer: Self-pay | Admitting: Family Medicine

## 2018-03-02 VITALS — BP 134/78 | HR 89 | Temp 98.1°F | Resp 16 | Ht 67.0 in | Wt 253.3 lb

## 2018-03-02 DIAGNOSIS — J4520 Mild intermittent asthma, uncomplicated: Secondary | ICD-10-CM

## 2018-03-02 DIAGNOSIS — E282 Polycystic ovarian syndrome: Secondary | ICD-10-CM

## 2018-03-02 DIAGNOSIS — E8881 Metabolic syndrome: Secondary | ICD-10-CM | POA: Diagnosis not present

## 2018-03-02 DIAGNOSIS — J302 Other seasonal allergic rhinitis: Secondary | ICD-10-CM

## 2018-03-02 DIAGNOSIS — A6004 Herpesviral vulvovaginitis: Secondary | ICD-10-CM

## 2018-03-02 DIAGNOSIS — I10 Essential (primary) hypertension: Secondary | ICD-10-CM

## 2018-03-02 DIAGNOSIS — F331 Major depressive disorder, recurrent, moderate: Secondary | ICD-10-CM

## 2018-03-02 DIAGNOSIS — E559 Vitamin D deficiency, unspecified: Secondary | ICD-10-CM

## 2018-03-02 DIAGNOSIS — J3089 Other allergic rhinitis: Secondary | ICD-10-CM

## 2018-03-02 MED ORDER — METFORMIN HCL 500 MG PO TABS: 500.0000 mg | ORAL_TABLET | Freq: Every day | ORAL | 1 refills | Status: DC

## 2018-03-02 MED ORDER — ATENOLOL 25 MG PO TABS: ORAL_TABLET | ORAL | 1 refills | Status: DC

## 2018-03-02 MED ORDER — LORATADINE 10 MG PO TABS: 10.0000 mg | ORAL_TABLET | Freq: Every day | ORAL | 1 refills | Status: DC

## 2018-03-02 MED ORDER — MONTELUKAST SODIUM 10 MG PO TABS: 10.0000 mg | ORAL_TABLET | Freq: Every day | ORAL | 1 refills | Status: DC

## 2018-03-02 MED ORDER — VITAMIN D (ERGOCALCIFEROL) 1.25 MG (50000 UNIT) PO CAPS: 50000.0000 [IU] | ORAL_CAPSULE | ORAL | 0 refills | Status: DC

## 2018-03-02 MED ORDER — VALACYCLOVIR HCL 500 MG PO TABS: 500.0000 mg | ORAL_TABLET | Freq: Every day | ORAL | 0 refills | Status: DC

## 2018-03-02 MED ORDER — BUPROPION HCL ER (XL) 150 MG PO TB24: 150.0000 mg | ORAL_TABLET | Freq: Every day | ORAL | 1 refills | Status: DC

## 2018-03-02 NOTE — Progress Notes (Signed)
Name: Rachel Dunlap   MRN: [id number]    DOB: 04-02-1996   Date:03/02/2018       Progress Note  Subjective  Chief Complaint  Chief Complaint  Patient presents with  . Medication Refill    3 month F/U  . Depression  . Obesity  . Hypertension    Denies any symptoms    HPI  Obesity:she is back at [employer] but less often now because she has been working 50 hours per week and going to school full time.  She could not afford Belviq or Ozempic. She is not meal prepping as much, only drinking diet sodas now usually only one per day, also walking more on campus, she , lost 7 lbs since last visit, she is going to bariatric center and is doing well, she is following a meal plan.   Depression Major/grieving: shenever properly grieved the loss of her father that died Dec 22, 2016, and now lost her half - brother 09/2017 so felt much worse. Taking Wellbutrin XL 150  mg and states also going to counseling with [facility] and has been doing well.  She states not crying anymore, very excited about finishing school Dec 2019 getting degree in hospitality and tourism, and is planning on IT consultant degree at [employer] started next fall.    HTN: bp is a little elevated today, but she states she skipped dose yesterday and also states has been anxious and nervous about finals, usually under control,  denies sob or chest pain. Also sees Dr. Ronn Melena and is taking vasotec because of microalbuminuria. She is aware of teratogenic effects of ACE  Pre-diabetes: she denies polyphagia, polydipsia or polyuria.   AR: doing well at this time, taking medications, no rhinorrhea or congestion  Asthma: she is taking singulair daily and has not needed to use rescue inhaler, denies wheezing, cough or SOB  Patient Active Problem List   Diagnosis Date Noted  . Prediabetes 02/14/2018  . Essential hypertension 02/14/2018  . Class 2 severe obesity with serious comorbidity and body mass index (BMI) of 39.0 to 39.9  in adult (HCC) 02/14/2018  . Moderate recurrent major depression (HCC) 02/16/2017  . Back pain due to injury 09/18/2016  . Acne vulgaris 09/16/2015  . Hypertrichosis 09/16/2015  . Chronic constipation 11/09/2014  . Chronic dermatitis 11/09/2014  . Dysmetabolic syndrome 11/09/2014  . Hypertension, benign 11/09/2014  . Genital herpes in women 11/09/2014  . Mild intermittent asthma 11/09/2014  . Allergic rhinitis, seasonal 11/09/2014  . Morbid (severe) obesity due to excess calories (HCC) 11/09/2014  . IBS (irritable bowel syndrome) 11/09/2014  . Bilateral polycystic ovarian syndrome 11/09/2014  . Abnormal presence of protein in urine 11/09/2014  . Vitamin D deficiency 11/09/2014  . Anxiety 11/09/2014    No past surgical history on file.  Family History  Problem Relation Age of Onset  . Hypertension Mother   . Obesity Mother   . Cancer Father   . Diabetes Father   . Kidney disease Father   . Obesity Maternal Grandmother     Social History   Socioeconomic History  . Marital status: Single    Spouse name: Not on file  . Number of children: 0  . Years of education: Not on file  . Highest education level: Some college, no degree  Occupational History  . Occupation: gas station attendant.     Comment: [employer]  Social Needs  . Financial resource strain: Somewhat hard  . Food insecurity:    Worry: Never true  Inability: Never true  . Transportation needs:    Medical: No    Non-medical: No  Tobacco Use  . Smoking status: Never Smoker  . Smokeless tobacco: Never Used  Substance and Sexual Activity  . Alcohol use: Yes    Alcohol/week: 0.0 standard drinks    Comment: seldom  . Drug use: No  . Sexual activity: Not Currently    Partners: Male    Birth control/protection: Pill, Abstinence  Lifestyle  . Physical activity:    Days per week: 2 days    Minutes per session: 60 min  . Stress: Not at all  Relationships  . Social connections:    Talks on phone: More than  three times a week    Gets together: More than three times a week    Attends religious service: More than 4 times per year    Active member of club or organization: Yes    Attends meetings of clubs or organizations: More than 4 times per year    Relationship status: Never married  . Intimate partner violence:    Fear of current or ex partner: No    Emotionally abused: No    Physically abused: No    Forced sexual activity: No  Other Topics Concern  . Not on file  Social History Narrative   She is going to school at [employer] - graduates Dec  2019 - hospitality and Diplomatic Services operational officertourism management - she wants to be Investment banker, operationalchef- goal is to work at a cruise ship   She works part time at [employer]      Current Outpatient Medications:  .  albuterol (PROVENTIL HFA;VENTOLIN HFA) 108 (90 Base) MCG/ACT inhaler, Inhale 2 puffs into the lungs every 6 (six) hours as needed for wheezing or shortness of breath., Disp: 1 Inhaler, Rfl: 0 .  APRI 0.15-30 MG-MCG tablet, TAKE 1 TABLET BY MOUTH EVERY DAY, Disp: 84 tablet, Rfl: 2 .  atenolol (TENORMIN) 25 MG tablet, TAKE 1 TABLET BY MOUTH EVERY EVENING FOR BP AND ANXIETY, Disp: 90 tablet, Rfl: 1 .  buPROPion (WELLBUTRIN XL) 150 MG 24 hr tablet, Take 1 tablet (150 mg total) by mouth daily., Disp: 90 tablet, Rfl: 0 .  Cholecalciferol (VITAMIN D) 2000 UNITS CAPS, Take 1 capsule by mouth daily., Disp: , Rfl:  .  cyclobenzaprine (FLEXERIL) 5 MG tablet, Take 1 tablet (5 mg total) by mouth 3 (three) times daily as needed for muscle spasms., Disp: 15 tablet, Rfl: 0 .  desoximetasone (TOPICORT) 0.25 % cream, Apply 1 application 2 (two) times daily topically., Disp: 100 g, Rfl: 0 .  enalapril (VASOTEC) 5 MG tablet, Take 1 tablet by mouth daily., Disp: , Rfl:  .  fluticasone (FLONASE) 50 MCG/ACT nasal spray, Place 2 sprays into both nostrils at bedtime., Disp: 48 g, Rfl: 1 .  ibuprofen (ADVIL,MOTRIN) 800 MG tablet, Take 1 tablet (800 mg total) by mouth every 8 (eight) hours as  needed., Disp: 30 tablet, Rfl: 0 .  ketoconazole (NIZORAL) 2 % cream, Apply 1 application topically at bedtime., Disp: 60 g, Rfl: 0 .  linaclotide (LINZESS) 72 MCG capsule, Take 1 capsule (72 mcg total) daily before breakfast by mouth., Disp: 90 capsule, Rfl: 1 .  loratadine (CLARITIN) 10 MG tablet, TAKE 1 TABLET BY MOUTH DAILY, Disp: 30 tablet, Rfl: 3 .  metFORMIN (GLUCOPHAGE) 500 MG tablet, Take 1 tablet (500 mg total) by mouth daily with breakfast., Disp: 90 tablet, Rfl: 1 .  montelukast (SINGULAIR) 10 MG tablet, Take 1  tablet (10 mg total) by mouth at bedtime., Disp: 90 tablet, Rfl: 1 .  valACYclovir (VALTREX) 500 MG tablet, TAKE 1 TABLET (500 MG TOTAL) BY MOUTH DAILY. AND TWICE DAILY ON OUTBREAKS, Disp: 115 tablet, Rfl: 0 .  Vitamin D, Ergocalciferol, (DRISDOL) 50000 units CAPS capsule, Take 1 capsule (50,000 Units total) by mouth every 7 (seven) days. (Patient not taking: Reported on 03/02/2018), Disp: 12 capsule, Rfl: 0  No Known Allergies  I personally reviewed active problem list, medication list, allergies, family history, social history with the patient/caregiver today.   ROS  Constitutional: Negative for fever, positive for  weight change - loss .  Respiratory: Negative for cough and shortness of breath.   Cardiovascular: Negative for chest pain or palpitations.  Gastrointestinal: Negative for abdominal pain, no bowel changes.  Musculoskeletal: Negative for gait problem or joint swelling.  Skin: Negative for rash.  Neurological: Negative for dizziness or headache.  No other specific complaints in a complete review of systems (except as listed in HPI above).  Objective  Vitals:   03/02/18 1002  BP: (!) 142/78  Pulse: 89  Resp: 16  Temp: 98.1 F (36.7 C)  TempSrc: Oral  SpO2: 99%  Weight: 253 lb 4.8 oz (114.9 kg)  Height: 5\' 7"  (1.702 m)    Body mass index is 39.67 kg/m.  Physical Exam  Constitutional: Patient appears well-developed and well-nourished. Obese  No  distress.  HEENT: head atraumatic, normocephalic, pupils equal and reactive to light, neck supple, throat within normal limits Cardiovascular: Normal rate, regular rhythm and normal heart sounds.  No murmur heard. No BLE edema. Pulmonary/Chest: Effort normal and breath sounds normal. No respiratory distress. Abdominal: Soft.  There is no tenderness. Psychiatric: Patient has a normal mood and affect. behavior is normal. Judgment and thought content normal.  Recent Results (from the past 2160 hour(s))  VITAMIN D 25 Hydroxy (Vit-D Deficiency, Fractures)     Status: None   Collection Time: 01/06/18  1:21 PM  Result Value Ref Range   Vit D, 25-Hydroxy 43.7 30.0 - 100.0 ng/mL    Comment: Vitamin D deficiency has been defined by the Institute of Medicine and an Endocrine Society practice guideline as a level of serum 25-OH vitamin D less than 20 ng/mL (1,2). The Endocrine Society went on to further define vitamin D insufficiency as a level between 21 and 29 ng/mL (2). 1. IOM (Institute of Medicine). 2010. Dietary reference    intakes for calcium and D. [place]: The    [employer]. 2. Holick MF, Binkley Lumpkin, Bischoff-Ferrari HA, et al.    Evaluation, treatment, and prevention of vitamin D    deficiency: an Endocrine Society clinical practice    guideline. JCEM. 2011 Jul; 96(7):1911-30.      PHQ2/9: Depression screen [facility] 2/9 03/02/2018 01/26/2018 01/06/2018 11/30/2017 10/19/2017  Decreased Interest 0 0 2 0 1  Down, Depressed, Hopeless 0 0 2 1 2   PHQ - 2 Score 0 0 4 1 3   Altered sleeping 0 0 0 1 2  Tired, decreased energy 1 1 2 1 2   Change in appetite 1 1 3  0 2  Feeling bad or failure about yourself  0 0 0 1 2  Trouble concentrating 0 0 1 0 2  Moving slowly or fidgety/restless 0 2 0 1 2  Suicidal thoughts 0 0 0 0 0  PHQ-9 Score 2 4 10 5 15   Difficult doing work/chores Not difficult at all - Somewhat difficult Somewhat difficult Somewhat difficult  Fall Risk: Fall  Risk  03/02/2018 11/30/2017 10/19/2017 03/20/2017 02/16/2017  Falls in the past year? 0 No No No No  Number falls in past yr: 0 - - - -    Functional Status Survey: Is the patient deaf or have difficulty hearing?: No Does the patient have difficulty seeing, even when wearing glasses/contacts?: No Does the patient have difficulty concentrating, remembering, or making decisions?: No Does the patient have difficulty walking or climbing stairs?: No Does the patient have difficulty dressing or bathing?: No Does the patient have difficulty doing errands alone such as visiting a doctor's office or shopping?: No    Assessment & Plan  1. Hypertension, benign  - atenolol (TENORMIN) 25 MG tablet; TAKE 1 TABLET BY MOUTH EVERY EVENING FOR BP AND ANXIETY  Dispense: 90 tablet; Refill: 1  2. Moderate recurrent major depression (HCC)  - buPROPion (WELLBUTRIN XL) 150 MG 24 hr tablet; Take 1 tablet (150 mg total) by mouth daily.  Dispense: 90 tablet; Refill: 1  3. Metabolic syndrome  - metFORMIN (GLUCOPHAGE) 500 MG tablet; Take 1 tablet (500 mg total) by mouth daily with breakfast.  Dispense: 90 tablet; Refill: 1  4. Bilateral polycystic ovarian syndrome  - metFORMIN (GLUCOPHAGE) 500 MG tablet; Take 1 tablet (500 mg total) by mouth daily with breakfast.  Dispense: 90 tablet; Refill: 1  5. Mild intermittent asthma, uncomplicated  - montelukast (SINGULAIR) 10 MG tablet; Take 1 tablet (10 mg total) by mouth at bedtime.  Dispense: 90 tablet; Refill: 1  6. Genital herpes  - valACYclovir (VALTREX) 500 MG tablet; Take 1 tablet (500 mg total) by mouth daily. And twice daily on outbreaks  Dispense: 115 tablet; Refill: 0  7. Perennial allergic rhinitis with seasonal variation  - loratadine (CLARITIN) 10 MG tablet; Take 1 tablet (10 mg total) by mouth daily.  Dispense: 90 tablet; Refill: 1  8. Vitamin D deficiency  - Vitamin D, Ergocalciferol, (DRISDOL) 1.25 MG (50000 UT) CAPS capsule; Take 1 capsule  (50,000 Units total) by mouth every 7 (seven) days.  Dispense: 12 capsule; Refill: 0

## 2018-03-08 DIAGNOSIS — F321 Major depressive disorder, single episode, moderate: Secondary | ICD-10-CM | POA: Diagnosis not present

## 2018-03-14 ENCOUNTER — Encounter (INDEPENDENT_AMBULATORY_CARE_PROVIDER_SITE_OTHER): Payer: Self-pay | Admitting: Bariatrics

## 2018-03-14 ENCOUNTER — Ambulatory Visit (INDEPENDENT_AMBULATORY_CARE_PROVIDER_SITE_OTHER): Payer: Federal, State, Local not specified - PPO | Admitting: Bariatrics

## 2018-03-14 VITALS — BP 139/82 | HR 79 | Temp 98.2°F | Ht 67.0 in | Wt 243.0 lb

## 2018-03-14 DIAGNOSIS — R7303 Prediabetes: Secondary | ICD-10-CM | POA: Diagnosis not present

## 2018-03-14 DIAGNOSIS — I10 Essential (primary) hypertension: Secondary | ICD-10-CM | POA: Diagnosis not present

## 2018-03-14 DIAGNOSIS — Z6838 Body mass index (BMI) 38.0-38.9, adult: Secondary | ICD-10-CM

## 2018-03-15 NOTE — Progress Notes (Signed)
Office: [phone]  /  Fax: [phone]   HPI:   Chief Complaint: OBESITY Rachel Dunlap is here to discuss her progress with her obesity treatment plan. She is on the Category 3 plan and is following her eating plan approximately 70 to 80 % of the time. She states she is exercising 0 minutes 0 times per week. Rachel Dunlap is doing well with the plan. She denies struggling at this time. Her weight is 243 lb (110.2 kg) today and has had a weight loss of 5 pounds over a period of 2 weeks since her last visit. She has lost 11 lbs since starting treatment with Rachel Dunlap.  Hypertension Rachel Dunlap is a 22 y.o. female with hypertension. Rachel Dunlap denies chest pain or shortness of breath on exertion. She is working weight loss to help control her blood pressure with the goal of decreasing her risk of heart attack and stroke. Rachel Dunlap blood pressure is reasonably well controlled, but her diastolic and systolic is at stage one levels.  Pre-Diabetes Rachel Dunlap has a diagnosis of prediabetes based on her elevated Hgb A1c and was informed this puts her at greater risk of developing diabetes. She is taking metformin currently and continues to work on diet and exercise to decrease risk of diabetes. She denies  hypoglycemia.  ALLERGIES: No Known Allergies  MEDICATIONS: Current Outpatient Medications on File Prior to Visit  Medication Sig Dispense Refill  . albuterol (PROVENTIL HFA;VENTOLIN HFA) 108 (90 Base) MCG/ACT inhaler Inhale 2 puffs into the lungs every 6 (six) hours as needed for wheezing or shortness of breath. 1 Inhaler 0  . APRI 0.15-30 MG-MCG tablet TAKE 1 TABLET BY MOUTH EVERY DAY 84 tablet 2  . atenolol (TENORMIN) 25 MG tablet TAKE 1 TABLET BY MOUTH EVERY EVENING FOR BP AND ANXIETY 90 tablet 1  . buPROPion (WELLBUTRIN XL) 150 MG 24 hr tablet Take 1 tablet (150 mg total) by mouth daily. 90 tablet 1  . Cholecalciferol (VITAMIN D) 2000 UNITS CAPS Take 1 capsule by mouth daily.    Marland Kitchen desoximetasone (TOPICORT)  0.25 % cream Apply 1 application 2 (two) times daily topically. 100 g 0  . enalapril (VASOTEC) 5 MG tablet Take 1 tablet by mouth daily.    . fluticasone (FLONASE) 50 MCG/ACT nasal spray Place 2 sprays into both nostrils at bedtime. 48 g 1  . ketoconazole (NIZORAL) 2 % cream Apply 1 application topically at bedtime. 60 g 0  . linaclotide (LINZESS) 72 MCG capsule Take 1 capsule (72 mcg total) daily before breakfast by mouth. 90 capsule 1  . loratadine (CLARITIN) 10 MG tablet Take 1 tablet (10 mg total) by mouth daily. 90 tablet 1  . metFORMIN (GLUCOPHAGE) 500 MG tablet Take 1 tablet (500 mg total) by mouth daily with breakfast. 90 tablet 1  . montelukast (SINGULAIR) 10 MG tablet Take 1 tablet (10 mg total) by mouth at bedtime. 90 tablet 1  . valACYclovir (VALTREX) 500 MG tablet Take 1 tablet (500 mg total) by mouth daily. And twice daily on outbreaks 115 tablet 0  . Vitamin D, Ergocalciferol, (DRISDOL) 1.25 MG (50000 UT) CAPS capsule Take 1 capsule (50,000 Units total) by mouth every 7 (seven) days. 12 capsule 0   No current facility-administered medications on file prior to visit.     PAST MEDICAL HISTORY: Past Medical History:  Diagnosis Date  . Acne   . Allergy   . Anxiety   . Asthma   . Chronic constipation   . Chronic dermatitis   .  Constipation   . Depression   . Dermatitis   . Dysmetabolic syndrome   . Genital herpes   . Hypertension   . Irritable bowel syndrome without diarrhea   . Kidney disease   . Morbid obesity with BMI of 40.0-44.9, adult (HCC)   . Panic disorder   . PCOS (polycystic ovarian syndrome)   . Pre-diabetes   . Proteinuria   . Stomach ulcer   . Vitamin D deficiency     PAST SURGICAL HISTORY: History reviewed. No pertinent surgical history.  SOCIAL HISTORY: Social History   Tobacco Use  . Smoking status: Never Smoker  . Smokeless tobacco: Never Used  Substance Use Topics  . Alcohol use: Yes    Alcohol/week: 0.0 standard drinks    Comment:  seldom  . Drug use: No    FAMILY HISTORY: Family History  Problem Relation Age of Onset  . Hypertension Mother   . Obesity Mother   . Cancer Father   . Diabetes Father   . Kidney disease Father   . Obesity Maternal Grandmother     ROS: Review of Systems  Constitutional: Positive for weight loss.  Respiratory: Negative for shortness of breath (on exertion).   Cardiovascular: Negative for chest pain.  Endo/Heme/Allergies:       Negative for hypoglycemia    PHYSICAL EXAM: Blood pressure 139/82, pulse 79, temperature 98.2 F (36.8 C), temperature source Oral, height 5\' 7"  (1.702 m), weight 243 lb (110.2 kg), SpO2 98 %. Body mass index is 38.06 kg/m. Physical Exam  Constitutional: She is oriented to person, place, and time. She appears well-developed and well-nourished.  Cardiovascular: Normal rate.  Pulmonary/Chest: Effort normal.  Musculoskeletal: Normal range of motion.  Neurological: She is oriented to person, place, and time.  Skin: Skin is warm and dry.  Psychiatric: She has a normal mood and affect. Her behavior is normal.  Vitals reviewed.   RECENT LABS AND TESTS: BMET    Component Value Date/Time   NA 138 10/19/2017 0950   K 3.9 10/19/2017 0950   CL 105 10/19/2017 0950   CO2 26 10/19/2017 0950   GLUCOSE 82 10/19/2017 0950   BUN 13 10/19/2017 0950   CREATININE 0.83 10/19/2017 0950   CALCIUM 9.2 10/19/2017 0950   GFRNONAA 99 10/19/2017 0950   GFRAA 115 10/19/2017 0950   Lab Results  Component Value Date   HGBA1C 5.7 (H) 10/19/2017   HGBA1C 5.5 03/13/2016   No results found for: INSULIN CBC    Component Value Date/Time   WBC 9.0 10/19/2017 0950   RBC 4.60 10/19/2017 0950   HGB 12.4 10/19/2017 0950   HCT 37.5 10/19/2017 0950   PLT 243 10/19/2017 0950   MCV 81.5 10/19/2017 0950   MCH 27.0 10/19/2017 0950   MCHC 33.1 10/19/2017 0950   RDW 12.7 10/19/2017 0950   LYMPHSABS 2,448 10/19/2017 0950   MONOABS 935 07/21/2016 1215   EOSABS 297  10/19/2017 0950   BASOSABS 36 10/19/2017 0950   Iron/TIBC/Ferritin/ %Sat No results found for: IRON, TIBC, FERRITIN, IRONPCTSAT Lipid Panel     Component Value Date/Time   CHOL 182 10/19/2017 0950   TRIG 117 10/19/2017 0950   HDL 58 10/19/2017 0950   CHOLHDL 3.1 10/19/2017 0950   VLDL 20 03/13/2016 0001   LDLCALC 103 (H) 10/19/2017 0950   Hepatic Function Panel     Component Value Date/Time   PROT 7.3 10/19/2017 0950   ALBUMIN 3.8 03/13/2016 0001   AST 15 10/19/2017 0950  ALT 11 10/19/2017 0950   ALKPHOS 55 03/13/2016 0001   BILITOT 0.3 10/19/2017 0950      Component Value Date/Time   TSH 1.43 10/19/2017 0950    Ref. Range 01/06/2018 13:21  Vitamin D, 25-Hydroxy Latest Ref Range: 30.0 - 100.0 ng/mL 43.7   ASSESSMENT AND PLAN: Essential hypertension  Prediabetes  Class 2 severe obesity with serious comorbidity and body mass index (BMI) of 38.0 to 38.9 in adult, unspecified obesity type (HCC)  PLAN:  Hypertension We discussed sodium restriction, working on healthy weight loss, and a regular exercise program as the means to achieve improved blood pressure control. Rachel Dunlap agreed with this plan and agreed to follow up as directed. We will continue to monitor her blood pressure as well as her progress with the above lifestyle modifications. She will continue her medications as prescribed and will watch for signs of hypotension as she continues her lifestyle modifications.  Pre-Diabetes Rachel Dunlap will continue to work on weight loss, exercise, and decreasing simple carbohydrates in her diet to help decrease the risk of diabetes. We dicussed metformin including benefits and risks. She was informed that eating too many simple carbohydrates or too many calories at one sitting increases the likelihood of GI side effects. Rachel Dunlap will continue metformin for now and a prescription was not written today. Rachel Dunlap agreed to follow up with Rachel Dunlap as directed to monitor her progress.  I spent > than  50% of the 15 minute visit on counseling as documented in the note.  Obesity Rachel Dunlap is currently in the action stage of change. As such, her goal is to continue with weight loss efforts She has agreed to follow the Category 3 plan Rachel Dunlap has been instructed to work up to a goal of 150 minutes of combined cardio and strengthening exercise per week for weight loss and overall health benefits. We discussed the following Behavioral Modification Strategies today: increase H2O intake, increasing lean protein intake, decreasing simple carbohydrates , increasing vegetables and work on meal planning and easy cooking plans  Rachel Dunlap has agreed to follow up with our clinic in 2 weeks fasting. She was informed of the importance of frequent follow up visits to maximize her success with intensive lifestyle modifications for her multiple health conditions.   OBESITY BEHAVIORAL INTERVENTION VISIT  Today's visit was # 6   Starting weight: 254 lbs Starting date: 01/06/2018 Today's weight : 243 lbs Today's date: 03/14/2018 Total lbs lost to date: 5811   ASK: We discussed the diagnosis of obesity with Rachel Dunlap today and Rachel Dunlap agreed to give Rachel Dunlap permission to discuss obesity behavioral modification therapy today.  ASSESS: Rachel Dunlap has the diagnosis of obesity and her BMI today is 38.05 Rachel Dunlap is in the action stage of change   ADVISE: Rachel Dunlap was educated on the multiple health risks of obesity as well as the benefit of weight loss to improve her health. She was advised of the need for long term treatment and the importance of lifestyle modifications to improve her current health and to decrease her risk of future health problems.  AGREE: Multiple dietary modification options and treatment options were discussed and  Rachel Dunlap agreed to follow the recommendations documented in the above note.  ARRANGE: Rachel Dunlap was educated on the importance of frequent visits to treat obesity as outlined per CMS and USPSTF  guidelines and agreed to schedule her next follow up appointment today.  Cristi LoronI, Joanne Murray, am acting as Energy managertranscriptionist for [employer]. Manson PasseyBrown, DO  I have reviewed the above documentation  for accuracy and completeness, and I agree with the above. -Jearld Lesch, DO

## 2018-03-28 ENCOUNTER — Ambulatory Visit (INDEPENDENT_AMBULATORY_CARE_PROVIDER_SITE_OTHER): Payer: Federal, State, Local not specified - PPO | Admitting: Bariatrics

## 2018-03-28 ENCOUNTER — Encounter (INDEPENDENT_AMBULATORY_CARE_PROVIDER_SITE_OTHER): Payer: Self-pay | Admitting: Bariatrics

## 2018-03-28 VITALS — BP 140/69 | HR 61 | Temp 97.9°F | Ht 67.0 in | Wt 246.0 lb

## 2018-03-28 DIAGNOSIS — Z9189 Other specified personal risk factors, not elsewhere classified: Secondary | ICD-10-CM | POA: Diagnosis not present

## 2018-03-28 DIAGNOSIS — R7303 Prediabetes: Secondary | ICD-10-CM

## 2018-03-28 DIAGNOSIS — Z6838 Body mass index (BMI) 38.0-38.9, adult: Secondary | ICD-10-CM

## 2018-03-28 DIAGNOSIS — F3289 Other specified depressive episodes: Secondary | ICD-10-CM | POA: Diagnosis not present

## 2018-03-28 NOTE — Progress Notes (Signed)
Office: [phone]  /  Fax: [phone]   HPI:   Chief Complaint: OBESITY Rachel Dunlap is here to discuss her progress with her obesity treatment plan. She is on the Category 3 plan and is following her eating plan approximately 70 to 80 % of the time. She states she is doing complete fitness for women 60 minutes 1 time per week. Rachel Dunlap states that her hunger and cravings are controlled. Her weight is 246 lb (111.6 kg) today and has had a weight gain of 3 pounds over a period of 2 weeks since her last visit. She has lost 8 lbs since starting treatment with us.  Pre-Diabetes Rachel Dunlap has a diagnosis of prediabetes based on her elevated Hgb A1c and was informed this puts her at greater risk of developing diabetes. She is taking metformin currently and continues to work on diet and exercise to decrease risk of diabetes. She denies hypoglycemia.  At risk for diabetes Rachel Dunlap is at higher than average risk for developing diabetes due to her obesity and prediabetes. She currently denies polyuria or polydipsia.  Depression with emotional eating behaviors Rachel Dunlap is struggling with emotional eating and using food for comfort to the extent that it is negatively impacting her health. She often snacks when she is not hungry. Rachel Dunlap sometimes feels she is out of control and then feels guilty that she made poor food choices. She has been working on behavior modification techniques to help reduce her emotional eating and has been somewhat successful. She shows no sign of suicidal or homicidal ideations.  [facility] 03/02/2018 01/26/2018 01/06/2018 11/30/2017 10/19/2017  Decreased Interest 0 0 2 0 1  Down, Depressed, Hopeless 0 0 2 1 2   PHQ - 2 Score 0 0 4 1 3   Altered sleeping 0 0 0 1 2  Tired, decreased energy 1 1 2 1 2   Change in appetite 1 1 3  0 2  Feeling bad or failure about yourself  0 0 0 1 2  Trouble concentrating 0 0 1 0 2  Moving slowly or fidgety/restless 0 2 0 1 2  Suicidal thoughts 0 0  0 0 0  PHQ-9 Score 2 4 10 5 15   Difficult doing work/chores Not difficult at all - Somewhat difficult Somewhat difficult Somewhat difficult     ASSESSMENT AND PLAN:  Prediabetes - Plan: Comprehensive metabolic panel, Hemoglobin A1c, Insulin, random  Other depression - with emotional eating  At risk for diabetes mellitus  Class 2 severe obesity with serious comorbidity and body mass index (BMI) of 38.0 to 38.9 in adult, unspecified obesity type (HCC)  PLAN:  Pre-Diabetes Rachel Dunlap will continue to work on weight loss, exercise, and decreasing simple carbohydrates in her diet to help decrease the risk of diabetes. We dicussed metformin including benefits and risks. She was informed that eating too many simple carbohydrates or too many calories at one sitting increases the likelihood of GI side effects. [name] agreed to continue metformin for now and a prescription was not written today. Rachel Dunlap agreed to follow up with us as directed to monitor her progress.  Diabetes risk counseling Rachel Dunlap was given extended (15 minutes) diabetes prevention counseling today. She is 22 y.o. female and has risk factors for diabetes including obesity and prediabetes. We discussed intensive lifestyle modifications today with an emphasis on weight loss as well as increasing exercise and decreasing simple carbohydrates in her diet.  Depression with Emotional Eating Behaviors We discussed behavior modification techniques today to help Rachel Dunlap deal with her  emotional eating and depression. She has agreed to continue Wellbutrin SR 150 mg qd and follow up as directed.  Obesity Rachel Dunlap is currently in the action stage of change. As such, her goal is to continue with weight loss efforts She has agreed to follow the Category 3 plan Rachel Dunlap has been instructed to work up to a goal of 150 minutes of combined cardio and strengthening exercise per week for weight loss and overall health benefits. We discussed the following  Behavioral Modification Strategies today: increase H2O intake, no skipping meals, increasing lean protein intake, decreasing simple carbohydrates, increasing vegetables, holiday eating strategies, and work on meal planning and easy cooking plans   Rachel Dunlap has agreed to follow up with our clinic in 2 weeks. She was informed of the importance of frequent follow up visits to maximize her success with intensive lifestyle modifications for her multiple health conditions.  ALLERGIES: No Known Allergies  MEDICATIONS: Current Outpatient Medications on File Prior to Visit  Medication Sig Dispense Refill  . albuterol (PROVENTIL HFA;VENTOLIN HFA) 108 (90 Base) MCG/ACT inhaler Inhale 2 puffs into the lungs every 6 (six) hours as needed for wheezing or shortness of breath. 1 Inhaler 0  . APRI 0.15-30 MG-MCG tablet TAKE 1 TABLET BY MOUTH EVERY DAY 84 tablet 2  . atenolol (TENORMIN) 25 MG tablet TAKE 1 TABLET BY MOUTH EVERY EVENING FOR BP AND ANXIETY 90 tablet 1  . buPROPion (WELLBUTRIN XL) 150 MG 24 hr tablet Take 1 tablet (150 mg total) by mouth daily. 90 tablet 1  . Cholecalciferol (VITAMIN D) 2000 UNITS CAPS Take 1 capsule by mouth daily.    Marland Kitchen desoximetasone (TOPICORT) 0.25 % cream Apply 1 application 2 (two) times daily topically. 100 g 0  . enalapril (VASOTEC) 5 MG tablet Take 1 tablet by mouth daily.    . fluticasone (FLONASE) 50 MCG/ACT nasal spray Place 2 sprays into both nostrils at bedtime. 48 g 1  . ketoconazole (NIZORAL) 2 % cream Apply 1 application topically at bedtime. 60 g 0  . linaclotide (LINZESS) 72 MCG capsule Take 1 capsule (72 mcg total) daily before breakfast by mouth. 90 capsule 1  . loratadine (CLARITIN) 10 MG tablet Take 1 tablet (10 mg total) by mouth daily. 90 tablet 1  . metFORMIN (GLUCOPHAGE) 500 MG tablet Take 1 tablet (500 mg total) by mouth daily with breakfast. 90 tablet 1  . montelukast (SINGULAIR) 10 MG tablet Take 1 tablet (10 mg total) by mouth at bedtime. 90 tablet 1  .  valACYclovir (VALTREX) 500 MG tablet Take 1 tablet (500 mg total) by mouth daily. And twice daily on outbreaks 115 tablet 0  . Vitamin D, Ergocalciferol, (DRISDOL) 1.25 MG (50000 UT) CAPS capsule Take 1 capsule (50,000 Units total) by mouth every 7 (seven) days. 12 capsule 0   No current facility-administered medications on file prior to visit.     PAST MEDICAL HISTORY: Past Medical History:  Diagnosis Date  . Acne   . Allergy   . Anxiety   . Asthma   . Chronic constipation   . Chronic dermatitis   . Constipation   . Depression   . Dermatitis   . Dysmetabolic syndrome   . Genital herpes   . Hypertension   . Irritable bowel syndrome without diarrhea   . Kidney disease   . Morbid obesity with BMI of 40.0-44.9, adult (HCC)   . Panic disorder   . PCOS (polycystic ovarian syndrome)   . Pre-diabetes   . Proteinuria   .  Stomach ulcer   . Vitamin D deficiency     PAST SURGICAL HISTORY: History reviewed. No pertinent surgical history.  SOCIAL HISTORY: Social History   Tobacco Use  . Smoking status: Never Smoker  . Smokeless tobacco: Never Used  Substance Use Topics  . Alcohol use: Yes    Alcohol/week: 0.0 standard drinks    Comment: seldom  . Drug use: No    FAMILY HISTORY: Family History  Problem Relation Age of Onset  . Hypertension Mother   . Obesity Mother   . Cancer Father   . Diabetes Father   . Kidney disease Father   . Obesity Maternal Grandmother     ROS: Review of Systems  Constitutional: Negative for weight loss.  Genitourinary: Negative for frequency.  Endo/Heme/Allergies: Negative for polydipsia.       Negative for hypoglycemia  Psychiatric/Behavioral: Positive for depression. Negative for suicidal ideas.    PHYSICAL EXAM: Blood pressure 140/69, pulse 61, temperature 97.9 F (36.6 C), temperature source Oral, height 5\' 7"  (1.702 m), weight 246 lb (111.6 kg), SpO2 96 %. Body mass index is 38.53 kg/m. Physical Exam Vitals signs reviewed.    Constitutional:      Appearance: Normal appearance. She is well-developed. She is obese.  Cardiovascular:     Rate and Rhythm: Normal rate.  Pulmonary:     Effort: Pulmonary effort is normal.  Musculoskeletal: Normal range of motion.  Skin:    General: Skin is warm and dry.  Neurological:     Mental Status: She is alert and oriented to person, place, and time.  Psychiatric:        Mood and Affect: Mood normal.        Behavior: Behavior normal.        Thought Content: Thought content does not include homicidal or suicidal ideation.     RECENT LABS AND TESTS: BMET    Component Value Date/Time   NA 138 10/19/2017 0950   K 3.9 10/19/2017 0950   CL 105 10/19/2017 0950   CO2 26 10/19/2017 0950   GLUCOSE 82 10/19/2017 0950   BUN 13 10/19/2017 0950   CREATININE 0.83 10/19/2017 0950   CALCIUM 9.2 10/19/2017 0950   GFRNONAA 99 10/19/2017 0950   GFRAA 115 10/19/2017 0950   Lab Results  Component Value Date   HGBA1C 5.7 (H) 10/19/2017   HGBA1C 5.5 03/13/2016   No results found for: INSULIN CBC    Component Value Date/Time   WBC 9.0 10/19/2017 0950   RBC 4.60 10/19/2017 0950   HGB 12.4 10/19/2017 0950   HCT 37.5 10/19/2017 0950   PLT 243 10/19/2017 0950   MCV 81.5 10/19/2017 0950   MCH 27.0 10/19/2017 0950   MCHC 33.1 10/19/2017 0950   RDW 12.7 10/19/2017 0950   LYMPHSABS 2,448 10/19/2017 0950   MONOABS 935 07/21/2016 1215   EOSABS 297 10/19/2017 0950   BASOSABS 36 10/19/2017 0950   Iron/TIBC/Ferritin/ %Sat No results found for: IRON, TIBC, FERRITIN, IRONPCTSAT Lipid Panel     Component Value Date/Time   CHOL 182 10/19/2017 0950   TRIG 117 10/19/2017 0950   HDL 58 10/19/2017 0950   CHOLHDL 3.1 10/19/2017 0950   VLDL 20 03/13/2016 0001   LDLCALC 103 (H) 10/19/2017 0950   Hepatic Function Panel     Component Value Date/Time   PROT 7.3 10/19/2017 0950   ALBUMIN 3.8 03/13/2016 0001   AST 15 10/19/2017 0950   ALT 11 10/19/2017 0950   ALKPHOS 55 03/13/2016  0001  BILITOT 0.3 10/19/2017 0950      Component Value Date/Time   TSH 1.43 10/19/2017 0950     Ref. Range 01/06/2018 13:21  Vitamin D, 25-Hydroxy Latest Ref Range: 30.0 - 100.0 ng/mL 43.7     OBESITY BEHAVIORAL INTERVENTION VISIT  Today's visit was # 7   Starting weight: 254 lbs Starting date: 01/06/2018 Today's weight : 246 lbs Today's date: 03/28/2018 Total lbs lost to date: 8   ASK: We discussed the diagnosis of obesity with Gladys Dammearia Molinelli today and Onia agreed to give us permission to discuss obesity behavioral modification therapy today.  ASSESS: Rachel Dunlap has the diagnosis of obesity and her BMI today is 38.52 Catina is in the action stage of change   ADVISE: Rachel Dunlap was educated on the multiple health risks of obesity as well as the benefit of weight loss to improve her health. She was advised of the need for long term treatment and the importance of lifestyle modifications to improve her current health and to decrease her risk of future health problems.  AGREE: Multiple dietary modification options and treatment options were discussed and  Haani agreed to follow the recommendations documented in the above note.  ARRANGE: Rachel Dunlap was educated on the importance of frequent visits to treat obesity as outlined per CMS and USPSTF guidelines and agreed to schedule her next follow up appointment today.  Cristi LoronI, Joanne Murray, am acting as Energy managertranscriptionist for [employer] Mans[employer] PasseyBrown, DO  I have reviewed the above documentation for accuracy and completeness, and I agree with the above. -Corinna CapraAngel Keya Wynes, DO

## 2018-03-29 DIAGNOSIS — F321 Major depressive disorder, single episode, moderate: Secondary | ICD-10-CM | POA: Diagnosis not present

## 2018-03-29 LAB — HEMOGLOBIN A1C
Est. average glucose Bld gHb Est-mCnc: 111 mg/dL
Hgb A1c MFr Bld: 5.5 % (ref 4.8–5.6)

## 2018-03-29 LAB — COMPREHENSIVE METABOLIC PANEL
ALBUMIN: 3.9 g/dL (ref 3.5–5.5)
ALT: 11 IU/L (ref 0–32)
AST: 17 IU/L (ref 0–40)
Albumin/Globulin Ratio: 1.4 (ref 1.2–2.2)
Alkaline Phosphatase: 50 IU/L (ref 39–117)
BILIRUBIN TOTAL: 0.4 mg/dL (ref 0.0–1.2)
BUN / CREAT RATIO: 13 (ref 9–23)
BUN: 11 mg/dL (ref 6–20)
CO2: 18 mmol/L — ABNORMAL LOW (ref 20–29)
Calcium: 9.2 mg/dL (ref 8.7–10.2)
Chloride: 106 mmol/L (ref 96–106)
Creatinine, Ser: 0.83 mg/dL (ref 0.57–1.00)
GFR calc non Af Amer: 100 mL/min/{1.73_m2} (ref >59)
GFR, EST AFRICAN AMERICAN: 115 mL/min/{1.73_m2} (ref >59)
GLOBULIN, TOTAL: 2.8 g/dL (ref 1.5–4.5)
GLUCOSE: 92 mg/dL (ref 65–99)
Potassium: 4.3 mmol/L (ref 3.5–5.2)
Sodium: 139 mmol/L (ref 134–144)
TOTAL PROTEIN: 6.7 g/dL (ref 6.0–8.5)

## 2018-03-29 LAB — INSULIN, RANDOM: INSULIN: 34.6 u[IU]/mL — ABNORMAL HIGH (ref 2.6–24.9)

## 2018-04-12 ENCOUNTER — Encounter (INDEPENDENT_AMBULATORY_CARE_PROVIDER_SITE_OTHER): Payer: Self-pay | Admitting: Bariatrics

## 2018-04-12 ENCOUNTER — Ambulatory Visit (INDEPENDENT_AMBULATORY_CARE_PROVIDER_SITE_OTHER): Payer: Federal, State, Local not specified - PPO | Admitting: Bariatrics

## 2018-04-12 VITALS — BP 135/85 | Temp 98.0°F | Ht 67.0 in | Wt 245.0 lb

## 2018-04-12 DIAGNOSIS — Z6838 Body mass index (BMI) 38.0-38.9, adult: Secondary | ICD-10-CM

## 2018-04-12 DIAGNOSIS — R7303 Prediabetes: Secondary | ICD-10-CM

## 2018-04-12 DIAGNOSIS — F3289 Other specified depressive episodes: Secondary | ICD-10-CM

## 2018-04-12 NOTE — Progress Notes (Signed)
Office: [phone]  /  Fax: [phone]   HPI:   Chief Complaint: OBESITY Rachel Dunlap is here to discuss her progress with her obesity treatment plan. She is on the Category 3 plan and is following her eating plan approximately 70 to 80 % of the time. She states she is exercising to fitness videos 30 minutes 3 times per week. Rachel Dunlap is doing well over the holiday with controlling her portions and making better choices. Her weight is 245 lb (111.1 kg) today and has had a weight loss of 1 pound over a period of 2 weeks since her last visit. She has lost 9 lbs since starting treatment with [place].  Pre-Diabetes Rachel Dunlap has a diagnosis of prediabetes based on her elevated Hgb A1c and was informed this puts her at greater risk of developing diabetes. Rachel Dunlap is taking metformin currently and she denies any side effects. Rachel Dunlap continues to work on diet and exercise to decrease risk of diabetes. She denies hypoglycemia.  Depression with emotional eating behaviors Rachel Dunlap is struggling with emotional eating and using food for comfort to the extent that it is negatively impacting her health. She often snacks when she is not hungry. Rachel Dunlap sometimes feels she is out of control and then feels guilty that she made poor food choices. She is currently taking Bupropion. She has been working on behavior modification techniques to help reduce her emotional eating and has been somewhat successful. She shows no sign of suicidal or homicidal ideations.  Depression screen [facility] 2/9 03/02/2018 01/26/2018 01/06/2018 11/30/2017 10/19/2017  Decreased Interest 0 0 2 0 1  Down, Depressed, Hopeless 0 0 2 1 2   PHQ - 2 Score 0 0 4 1 3   Altered sleeping 0 0 0 1 2  Tired, decreased energy 1 1 2 1 2   Change in appetite 1 1 3  0 2  Feeling bad or failure about yourself  0 0 0 1 2  Trouble concentrating 0 0 1 0 2  Moving slowly or fidgety/restless 0 2 0 1 2  Suicidal thoughts 0 0 0 0 0  PHQ-9 Score 2 4 10 5 15   Difficult doing work/chores  Not difficult at all - Somewhat difficult Somewhat difficult Somewhat difficult     ASSESSMENT AND PLAN:  Prediabetes  Other depression - with emotional eating   Class 2 severe obesity with serious comorbidity and body mass index (BMI) of 38.0 to 38.9 in adult, unspecified obesity type (HCC)  PLAN:  Pre-Diabetes Rachel Dunlap will continue to work on weight loss, exercise, and decreasing simple carbohydrates in her diet to help decrease the risk of diabetes. We dicussed metformin including benefits and risks. She was informed that eating too many simple carbohydrates or too many calories at one sitting increases the likelihood of GI side effects. Rachel Dunlap will metformin for now and a prescription was not written today. Rachel Dunlap agreed to follow up with [place] as directed to monitor her progress.  Depression with Emotional Eating Behaviors We discussed behavior modification techniques today to help Rachel Dunlap deal with her emotional eating and depression. She has agreed to take Wellbutrin SR 150 mg qd and agreed to follow up as directed.  I spent > than 50% of the 15 minute visit on counseling as documented in the note.  Obesity Rachel Dunlap is currently in the action stage of change. As such, her goal is to continue with weight loss efforts She has agreed to follow the Category 3 plan [name] has been instructed to work up to a goal  of 150 minutes of combined cardio and strengthening exercise per week for weight loss and overall health benefits. We discussed the following Behavioral Modification Strategies today: increase H2O intake, no skipping meals, increasing lean protein intake, decreasing simple carbohydrates, increasing vegetables, work on meal planning and easy cooking plans and decrease liquid calories Handouts for store bought seasonings and homemade seasonings were provided to patient today. We discussed options for the "air fryer" today.  Rachel Dunlap has agreed to follow up with our clinic in 2 weeks fasting.  She was informed of the importance of frequent follow up visits to maximize her success with intensive lifestyle modifications for her multiple health conditions.  ALLERGIES: No Known Allergies  MEDICATIONS: Current Outpatient Medications on File Prior to Visit  Medication Sig Dispense Refill  . albuterol (PROVENTIL HFA;VENTOLIN HFA) 108 (90 Base) MCG/ACT inhaler Inhale 2 puffs into the lungs every 6 (six) hours as needed for wheezing or shortness of breath. 1 Inhaler 0  . APRI 0.15-30 MG-MCG tablet TAKE 1 TABLET BY MOUTH EVERY DAY 84 tablet 2  . atenolol (TENORMIN) 25 MG tablet TAKE 1 TABLET BY MOUTH EVERY EVENING FOR BP AND ANXIETY 90 tablet 1  . buPROPion (WELLBUTRIN XL) 150 MG 24 hr tablet Take 1 tablet (150 mg total) by mouth daily. 90 tablet 1  . Cholecalciferol (VITAMIN D) 2000 UNITS CAPS Take 1 capsule by mouth daily.    Marland Kitchen desoximetasone (TOPICORT) 0.25 % cream Apply 1 application 2 (two) times daily topically. 100 g 0  . enalapril (VASOTEC) 5 MG tablet Take 1 tablet by mouth daily.    . fluticasone (FLONASE) 50 MCG/ACT nasal spray Place 2 sprays into both nostrils at bedtime. 48 g 1  . ketoconazole (NIZORAL) 2 % cream Apply 1 application topically at bedtime. 60 g 0  . linaclotide (LINZESS) 72 MCG capsule Take 1 capsule (72 mcg total) daily before breakfast by mouth. 90 capsule 1  . loratadine (CLARITIN) 10 MG tablet Take 1 tablet (10 mg total) by mouth daily. 90 tablet 1  . metFORMIN (GLUCOPHAGE) 500 MG tablet Take 1 tablet (500 mg total) by mouth daily with breakfast. 90 tablet 1  . montelukast (SINGULAIR) 10 MG tablet Take 1 tablet (10 mg total) by mouth at bedtime. 90 tablet 1  . valACYclovir (VALTREX) 500 MG tablet Take 1 tablet (500 mg total) by mouth daily. And twice daily on outbreaks 115 tablet 0  . Vitamin D, Ergocalciferol, (DRISDOL) 1.25 MG (50000 UT) CAPS capsule Take 1 capsule (50,000 Units total) by mouth every 7 (seven) days. 12 capsule 0   No current  facility-administered medications on file prior to visit.     PAST MEDICAL HISTORY: Past Medical History:  Diagnosis Date  . Acne   . Allergy   . Anxiety   . Asthma   . Chronic constipation   . Chronic dermatitis   . Constipation   . Depression   . Dermatitis   . Dysmetabolic syndrome   . Genital herpes   . Hypertension   . Irritable bowel syndrome without diarrhea   . Kidney disease   . Morbid obesity with BMI of 40.0-44.9, adult (HCC)   . Panic disorder   . PCOS (polycystic ovarian syndrome)   . Pre-diabetes   . Proteinuria   . Stomach ulcer   . Vitamin D deficiency     PAST SURGICAL HISTORY: No past surgical history on file.  SOCIAL HISTORY: Social History   Tobacco Use  . Smoking status: Never Smoker  .  Smokeless tobacco: Never Used  Substance Use Topics  . Alcohol use: Yes    Alcohol/week: 0.0 standard drinks    Comment: seldom  . Drug use: No    FAMILY HISTORY: Family History  Problem Relation Age of Onset  . Hypertension Mother   . Obesity Mother   . Cancer Father   . Diabetes Father   . Kidney disease Father   . Obesity Maternal Grandmother     ROS: Review of Systems  Constitutional: Positive for weight loss.  Gastrointestinal: Negative for diarrhea, nausea and vomiting.  Endo/Heme/Allergies:       Negative for hypoglycemia  Psychiatric/Behavioral: Positive for depression. Negative for suicidal ideas.    PHYSICAL EXAM: Blood pressure 135/85, temperature 98 F (36.7 C), temperature source Oral, height 5\' 7"  (1.702 m), weight 245 lb (111.1 kg). Body mass index is 38.37 kg/m. Physical Exam Vitals signs reviewed.  Constitutional:      Appearance: Normal appearance. She is well-developed. She is obese.  Cardiovascular:     Rate and Rhythm: Normal rate.  Pulmonary:     Effort: Pulmonary effort is normal.  Musculoskeletal: Normal range of motion.  Skin:    General: Skin is warm and dry.  Neurological:     Mental Status: She is alert  and oriented to person, place, and time.  Psychiatric:        Mood and Affect: Mood normal.        Behavior: Behavior normal.        Thought Content: Thought content does not include homicidal or suicidal ideation.     RECENT LABS AND TESTS: BMET    Component Value Date/Time   NA 139 03/28/2018 1004   K 4.3 03/28/2018 1004   CL 106 03/28/2018 1004   CO2 18 (L) 03/28/2018 1004   GLUCOSE 92 03/28/2018 1004   GLUCOSE 82 10/19/2017 0950   BUN 11 03/28/2018 1004   CREATININE 0.83 03/28/2018 1004   CREATININE 0.83 10/19/2017 0950   CALCIUM 9.2 03/28/2018 1004   GFRNONAA 100 03/28/2018 1004   GFRNONAA 99 10/19/2017 0950   GFRAA 115 03/28/2018 1004   GFRAA 115 10/19/2017 0950   Lab Results  Component Value Date   HGBA1C 5.5 03/28/2018   HGBA1C 5.7 (H) 10/19/2017   HGBA1C 5.5 03/13/2016   Lab Results  Component Value Date   INSULIN 34.6 (H) 03/28/2018   CBC    Component Value Date/Time   WBC 9.0 10/19/2017 0950   RBC 4.60 10/19/2017 0950   HGB 12.4 10/19/2017 0950   HCT 37.5 10/19/2017 0950   PLT 243 10/19/2017 0950   MCV 81.5 10/19/2017 0950   MCH 27.0 10/19/2017 0950   MCHC 33.1 10/19/2017 0950   RDW 12.7 10/19/2017 0950   LYMPHSABS 2,448 10/19/2017 0950   MONOABS 935 07/21/2016 1215   EOSABS 297 10/19/2017 0950   BASOSABS 36 10/19/2017 0950   Iron/TIBC/Ferritin/ %Sat No results found for: IRON, TIBC, FERRITIN, IRONPCTSAT Lipid Panel     Component Value Date/Time   CHOL 182 10/19/2017 0950   TRIG 117 10/19/2017 0950   HDL 58 10/19/2017 0950   CHOLHDL 3.1 10/19/2017 0950   VLDL 20 03/13/2016 0001   LDLCALC 103 (H) 10/19/2017 0950   Hepatic Function Panel     Component Value Date/Time   PROT 6.7 03/28/2018 1004   ALBUMIN 3.9 03/28/2018 1004   AST 17 03/28/2018 1004   ALT 11 03/28/2018 1004   ALKPHOS 50 03/28/2018 1004   BILITOT 0.4 03/28/2018 1004  Component Value Date/Time   TSH 1.43 10/19/2017 0950   Results for Gladys DammeJACKSON, Correna (MRN  [id number]) as of 04/12/2018 16:33  Ref. Range 01/06/2018 13:21  Vitamin D, 25-Hydroxy Latest Ref Range: 30.0 - 100.0 ng/mL 43.7     OBESITY BEHAVIORAL INTERVENTION VISIT  Today's visit was # 7   Starting weight: 254 lbs Starting date: 01/06/2018 Today's weight : 245 lbs Today's date: 04/12/2018 Total lbs lost to date: 9   ASK: We discussed the diagnosis of obesity with Gladys Dammearia Butrick today and Caiya agreed to give us permission to discuss obesity behavioral modification therapy today.  ASSESS: Demetrios IsaacsCaria has the diagnosis of obesity and her BMI today is 38.36 Mariany is in the action stage of change   ADVISE: Demetrios IsaacsCaria was educated on the multiple health risks of obesity as well as the benefit of weight loss to improve her health. She was advised of the need for long term treatment and the importance of lifestyle modifications to improve her current health and to decrease her risk of future health problems.  AGREE: Multiple dietary modification options and treatment options were discussed and  Skyanne agreed to follow the recommendations documented in the above note.  ARRANGE: Demetrios IsaacsCaria was educated on the importance of frequent visits to treat obesity as outlined per CMS and USPSTF guidelines and agreed to schedule her next follow up appointment today.  Cristi LoronI, Joanne Murray, am acting as Energy managertranscriptionist for [employer] A. Manson PasseyBrown, DO  I have reviewed the above documentation for accuracy and completeness, and I agree with the above. -Corinna CapraAngel Minetta Krisher, DO

## 2018-04-17 ENCOUNTER — Other Ambulatory Visit: Payer: Self-pay | Admitting: Family Medicine

## 2018-04-17 DIAGNOSIS — J4521 Mild intermittent asthma with (acute) exacerbation: Secondary | ICD-10-CM

## 2018-04-17 DIAGNOSIS — L2082 Flexural eczema: Secondary | ICD-10-CM

## 2018-04-18 MED ORDER — ALBUTEROL SULFATE HFA 108 (90 BASE) MCG/ACT IN AERS: 2.0000 | INHALATION_SPRAY | Freq: Four times a day (QID) | RESPIRATORY_TRACT | 0 refills | Status: DC | PRN

## 2018-04-18 MED ORDER — DESOXIMETASONE 0.25 % EX CREA: 1.0000 "application " | TOPICAL_CREAM | Freq: Two times a day (BID) | CUTANEOUS | 0 refills | Status: DC

## 2018-04-21 DIAGNOSIS — F321 Major depressive disorder, single episode, moderate: Secondary | ICD-10-CM | POA: Diagnosis not present

## 2018-05-02 ENCOUNTER — Ambulatory Visit (INDEPENDENT_AMBULATORY_CARE_PROVIDER_SITE_OTHER): Payer: Federal, State, Local not specified - PPO | Admitting: Bariatrics

## 2018-05-02 ENCOUNTER — Encounter (INDEPENDENT_AMBULATORY_CARE_PROVIDER_SITE_OTHER): Payer: Self-pay | Admitting: Bariatrics

## 2018-05-02 VITALS — BP 146/78 | HR 68 | Temp 98.1°F | Ht 67.0 in | Wt 246.0 lb

## 2018-05-02 DIAGNOSIS — R7303 Prediabetes: Secondary | ICD-10-CM | POA: Diagnosis not present

## 2018-05-02 DIAGNOSIS — E559 Vitamin D deficiency, unspecified: Secondary | ICD-10-CM | POA: Diagnosis not present

## 2018-05-02 DIAGNOSIS — Z9189 Other specified personal risk factors, not elsewhere classified: Secondary | ICD-10-CM

## 2018-05-02 DIAGNOSIS — Z6838 Body mass index (BMI) 38.0-38.9, adult: Secondary | ICD-10-CM | POA: Diagnosis not present

## 2018-05-02 DIAGNOSIS — F3289 Other specified depressive episodes: Secondary | ICD-10-CM | POA: Diagnosis not present

## 2018-05-02 NOTE — Progress Notes (Signed)
Office: [phone]  /  Fax: [phone]   HPI:   Chief Complaint: OBESITY Rachel Dunlap is here to discuss her progress with her obesity treatment plan. She is on the Category 3 plan and is following her eating plan approximately 70 to 80 % of the time. She states she is doing a work out video 30 to 45 minutes 3 times per week. Rachel Dunlap is doing well overall. She states that she is doing "well" over the past 2 weeks. She is getting adequate water and protein. Rachel Dunlap is up about 1.5 pounds of water.  Her weight is 246 lb (111.6 kg) today and has had a weight gain of 1 pounds over a period of 3 weeks since her last visit. She has lost 8 lbs since starting treatment with [place].  Pre-Diabetes Rachel Dunlap has a diagnosis of pre-diabetes based on her elevated Hgb A1c and was informed this puts her at greater risk of developing diabetes. Her last A1c was 5.5 and Insulin was 34.6 on 03/28/18. She is taking metformin currently and continues to work on diet and exercise to decrease risk of diabetes.   Depression with emotional eating behaviors Rachel Dunlap is taking bupropion and has reduced stress eating, but is still struggling with emotional eating and using food for comfort to the extent that it is negatively impacting her health. She often snacks when she is not hungry. Rachel Dunlap sometimes feels she is out of control and then feels guilty that she made poor food choices. She has been working on behavior modification techniques to help reduce her emotional eating and has been somewhat successful. She shows no sign of suicidal or homicidal ideations.  Vitamin D deficiency Rachel Dunlap has a diagnosis of vitamin D deficiency. She is currently taking high dose vit D and denies nausea, vomiting, or muscle weakness.  At risk for osteopenia and osteoporosis Rachel Dunlap is at higher risk of osteopenia and osteoporosis due to vitamin D deficiency.   ASSESSMENT AND PLAN:  Prediabetes  Vitamin D deficiency - Plan: VITAMIN D 25 Hydroxy (Vit-D  Deficiency, Fractures)  Other depression - with emotional eating  At risk for osteoporosis  Class 2 severe obesity with serious comorbidity and body mass index (BMI) of 38.0 to 38.9 in adult, unspecified obesity type (HCC)  PLAN:  Pre-Diabetes Madeira will continue to work on weight loss, exercise, and decreasing simple carbohydrates in her diet to help decrease the risk of diabetes. She was informed that eating too many simple carbohydrates or too many calories at one sitting increases the likelihood of GI side effects. Jaydeen agreed to continue metformin and a prescription was not written today. Lovada agreed to follow up with [place] as directed to monitor her progress in 2 weeks.  Vitamin D Deficiency Eireann was informed that low vitamin D levels contributes to fatigue and are associated with obesity, breast, and colon cancer. She agrees to continue to take prescription Vit D @50 ,000 IU every week and will follow up for routine testing of vitamin D, at least 2-3 times per year. She was informed of the risk of over-replacement of vitamin D and agrees to not increase her dose unless she discusses this with [place] first. We will check her vitamin D level today and Rachel Dunlap agrees to follow up at the agreed upon time.  At risk for osteopenia and osteoporosis Rachel Dunlap was given extended (15 minutes) osteoporosis prevention counseling today. Rachel Dunlap is at risk for osteopenia and osteoporosis due to her vitamin D deficiency. She was encouraged to take her vitamin  D and follow her higher calcium diet and increase strengthening exercise to help strengthen her bones and decrease her risk of osteopenia and osteoporosis.  Depression with Emotional Eating Behaviors We discussed behavior modification techniques today to help Rachel Dunlap deal with her emotional eating and depression. She has agreed to take bupropion 150mg  and agreed to follow up as directed.  Obesity Rachel Dunlap is currently in the action stage of change. As such, her  goal is to continue with weight loss efforts. She has agreed to follow the Category 3 plan and to continue with meal prep. Rachel Dunlap has been instructed to work up to a goal of 150 minutes of combined cardio and strengthening exercise per week for weight loss and overall health benefits. We discussed the following Behavioral Modification Strategies today: increasing lean protein intake, decreasing simple carbohydrates, increasing vegetables, increasing H2O intake, keeping healthy foods in the home, better snacking choices, work on meal planning and easy cooking plans, and emotional eating strategies.  Rachel Dunlap has agreed to follow up with our clinic in 2 weeks. She was informed of the importance of frequent follow up visits to maximize her success with intensive lifestyle modifications for her multiple health conditions.  ALLERGIES: No Known Allergies  MEDICATIONS: Current Outpatient Medications on File Prior to Visit  Medication Sig Dispense Refill  . albuterol (PROVENTIL HFA;VENTOLIN HFA) 108 (90 Base) MCG/ACT inhaler Inhale 2 puffs into the lungs every 6 (six) hours as needed for wheezing or shortness of breath. 1 Inhaler 0  . APRI 0.15-30 MG-MCG tablet TAKE 1 TABLET BY MOUTH EVERY DAY 84 tablet 2  . atenolol (TENORMIN) 25 MG tablet TAKE 1 TABLET BY MOUTH EVERY EVENING FOR BP AND ANXIETY 90 tablet 1  . buPROPion (WELLBUTRIN XL) 150 MG 24 hr tablet Take 1 tablet (150 mg total) by mouth daily. 90 tablet 1  . Cholecalciferol (VITAMIN D) 2000 UNITS CAPS Take 1 capsule by mouth daily.    Marland Kitchen. desoximetasone (TOPICORT) 0.25 % cream Apply 1 application topically 2 (two) times daily. 100 g 0  . enalapril (VASOTEC) 5 MG tablet Take 1 tablet by mouth daily.    . fluticasone (FLONASE) 50 MCG/ACT nasal spray Place 2 sprays into both nostrils at bedtime. 48 g 1  . ketoconazole (NIZORAL) 2 % cream Apply 1 application topically at bedtime. 60 g 0  . linaclotide (LINZESS) 72 MCG capsule Take 1 capsule (72 mcg total)  daily before breakfast by mouth. 90 capsule 1  . loratadine (CLARITIN) 10 MG tablet Take 1 tablet (10 mg total) by mouth daily. 90 tablet 1  . metFORMIN (GLUCOPHAGE) 500 MG tablet Take 1 tablet (500 mg total) by mouth daily with breakfast. 90 tablet 1  . montelukast (SINGULAIR) 10 MG tablet Take 1 tablet (10 mg total) by mouth at bedtime. 90 tablet 1  . valACYclovir (VALTREX) 500 MG tablet Take 1 tablet (500 mg total) by mouth daily. And twice daily on outbreaks 115 tablet 0  . Vitamin D, Ergocalciferol, (DRISDOL) 1.25 MG (50000 UT) CAPS capsule Take 1 capsule (50,000 Units total) by mouth every 7 (seven) days. 12 capsule 0   No current facility-administered medications on file prior to visit.     PAST MEDICAL HISTORY: Past Medical History:  Diagnosis Date  . Acne   . Allergy   . Anxiety   . Asthma   . Chronic constipation   . Chronic dermatitis   . Constipation   . Depression   . Dermatitis   . Dysmetabolic syndrome   .  Genital herpes   . Hypertension   . Irritable bowel syndrome without diarrhea   . Kidney disease   . Morbid obesity with BMI of 40.0-44.9, adult (HCC)   . Panic disorder   . PCOS (polycystic ovarian syndrome)   . Pre-diabetes   . Proteinuria   . Stomach ulcer   . Vitamin D deficiency     PAST SURGICAL HISTORY: History reviewed. No pertinent surgical history.  SOCIAL HISTORY: Social History   Tobacco Use  . Smoking status: Never Smoker  . Smokeless tobacco: Never Used  Substance Use Topics  . Alcohol use: Yes    Alcohol/week: 0.0 standard drinks    Comment: seldom  . Drug use: No    FAMILY HISTORY: Family History  Problem Relation Age of Onset  . Hypertension Mother   . Obesity Mother   . Cancer Father   . Diabetes Father   . Kidney disease Father   . Obesity Maternal Grandmother    ROS: Review of Systems  Constitutional: Negative for weight loss.  Psychiatric/Behavioral: Positive for depression. Negative for suicidal ideas.        Negative for homicidal ideations.   PHYSICAL EXAM: Blood pressure (!) 146/78, pulse 68, temperature 98.1 F (36.7 C), temperature source Oral, height 5\' 7"  (1.702 m), weight 246 lb (111.6 kg), last menstrual period 04/08/2018, SpO2 99 %. Body mass index is 38.53 kg/m. Physical Exam Vitals signs reviewed.  Constitutional:      Appearance: Normal appearance. She is obese.  Cardiovascular:     Rate and Rhythm: Normal rate.  Pulmonary:     Effort: Pulmonary effort is normal.  Musculoskeletal: Normal range of motion.  Skin:    General: Skin is warm and dry.  Neurological:     Mental Status: She is alert and oriented to person, place, and time.  Psychiatric:        Mood and Affect: Mood normal.        Behavior: Behavior normal.    RECENT LABS AND TESTS: BMET    Component Value Date/Time   NA 139 03/28/2018 1004   K 4.3 03/28/2018 1004   CL 106 03/28/2018 1004   CO2 18 (L) 03/28/2018 1004   GLUCOSE 92 03/28/2018 1004   GLUCOSE 82 10/19/2017 0950   BUN 11 03/28/2018 1004   CREATININE 0.83 03/28/2018 1004   CREATININE 0.83 10/19/2017 0950   CALCIUM 9.2 03/28/2018 1004   GFRNONAA 100 03/28/2018 1004   GFRNONAA 99 10/19/2017 0950   GFRAA 115 03/28/2018 1004   GFRAA 115 10/19/2017 0950   Lab Results  Component Value Date   HGBA1C 5.5 03/28/2018   HGBA1C 5.7 (H) 10/19/2017   HGBA1C 5.5 03/13/2016   Lab Results  Component Value Date   INSULIN 34.6 (H) 03/28/2018   CBC    Component Value Date/Time   WBC 9.0 10/19/2017 0950   RBC 4.60 10/19/2017 0950   HGB 12.4 10/19/2017 0950   HCT 37.5 10/19/2017 0950   PLT 243 10/19/2017 0950   MCV 81.5 10/19/2017 0950   MCH 27.0 10/19/2017 0950   MCHC 33.1 10/19/2017 0950   RDW 12.7 10/19/2017 0950   LYMPHSABS 2,448 10/19/2017 0950   MONOABS 935 07/21/2016 1215   EOSABS 297 10/19/2017 0950   BASOSABS 36 10/19/2017 0950   Iron/TIBC/Ferritin/ %Sat No results found for: IRON, TIBC, FERRITIN, IRONPCTSAT Lipid Panel       Component Value Date/Time   CHOL 182 10/19/2017 0950   TRIG 117 10/19/2017 0950   HDL  58 10/19/2017 0950   CHOLHDL 3.1 10/19/2017 0950   VLDL 20 03/13/2016 0001   LDLCALC 103 (H) 10/19/2017 0950   Hepatic Function Panel     Component Value Date/Time   PROT 6.7 03/28/2018 1004   ALBUMIN 3.9 03/28/2018 1004   AST 17 03/28/2018 1004   ALT 11 03/28/2018 1004   ALKPHOS 50 03/28/2018 1004   BILITOT 0.4 03/28/2018 1004      Component Value Date/Time   TSH 1.43 10/19/2017 0950   Results for RATEEL, BELDIN (MRN [id number]) as of 05/02/2018 10:07  Ref. Range 01/06/2018 13:21  Vitamin D, 25-Hydroxy Latest Ref Range: 30.0 - 100.0 ng/mL 43.7    OBESITY BEHAVIORAL INTERVENTION VISIT  Today's visit was # 8   Starting weight: 254 lbs Starting date: 01/06/18 Today's weight : Weight: 246 lb (111.6 kg)  Today's date: 05/02/2018 Total lbs lost to date: 8  ASK: We discussed the diagnosis of obesity with Gladys Damme today and Benedetta agreed to give [place] permission to discuss obesity behavioral modification therapy today.  ASSESS: Lynnette has the diagnosis of obesity and her BMI today is 38.5. Ritisha is in the action stage of change.   ADVISE: Francene was educated on the multiple health risks of obesity as well as the benefit of weight loss to improve her health. She was advised of the need for long term treatment and the importance of lifestyle modifications to improve her current health and to decrease her risk of future health problems.  AGREE: Multiple dietary modification options and treatment options were discussed and Lotta agreed to follow the recommendations documented in the above note.  ARRANGE: Joesphine was educated on the importance of frequent visits to treat obesity as outlined per CMS and USPSTF guidelines and agreed to schedule her next follow up appointment today.  Launa Flight, am acting as Energy manager for [employer], DO  I have reviewed the above documentation for  accuracy and completeness, and I agree with the above. -Corinna Capra, DO

## 2018-05-03 LAB — VITAMIN D 25 HYDROXY (VIT D DEFICIENCY, FRACTURES): Vit D, 25-Hydroxy: 36.8 ng/mL (ref 30.0–100.0)

## 2018-05-16 ENCOUNTER — Encounter (INDEPENDENT_AMBULATORY_CARE_PROVIDER_SITE_OTHER): Payer: Self-pay | Admitting: Family Medicine

## 2018-05-16 ENCOUNTER — Ambulatory Visit (INDEPENDENT_AMBULATORY_CARE_PROVIDER_SITE_OTHER): Payer: Federal, State, Local not specified - PPO | Admitting: Family Medicine

## 2018-05-16 VITALS — BP 124/82 | HR 69 | Temp 97.8°F | Ht 67.0 in | Wt 246.0 lb

## 2018-05-16 DIAGNOSIS — F3289 Other specified depressive episodes: Secondary | ICD-10-CM

## 2018-05-16 DIAGNOSIS — E8881 Metabolic syndrome: Secondary | ICD-10-CM

## 2018-05-16 DIAGNOSIS — Z6838 Body mass index (BMI) 38.0-38.9, adult: Secondary | ICD-10-CM | POA: Diagnosis not present

## 2018-05-16 DIAGNOSIS — E88819 Insulin resistance, unspecified: Secondary | ICD-10-CM | POA: Insufficient documentation

## 2018-05-16 DIAGNOSIS — F329 Major depressive disorder, single episode, unspecified: Secondary | ICD-10-CM | POA: Insufficient documentation

## 2018-05-16 DIAGNOSIS — F32A Depression, unspecified: Secondary | ICD-10-CM | POA: Insufficient documentation

## 2018-05-16 NOTE — Progress Notes (Signed)
Office: [phone]  /  Fax: [phone]   HPI:   Chief Complaint: OBESITY Rachel Dunlap is here to discuss her progress with her obesity treatment plan. She is on the Category 3 plan and is following her eating plan approximately 70 % of the time. She states she is exercising with a workout video 30-45 minutes 3 times per week. Bradley works at [employer] but does well on plan at work. She denies polyphagia. Her weight is 246 lb (111.6 kg) today and has gained 1 pound since her last visit. She has lost 8 lbs since starting treatment with [place].  Depression with emotional eating behaviors Shawnda was struggling with emotional eating and using food for comfort to the extent that it was negatively impacting her health while in college. She has graduated college which has reduced stress. (Graduated December 2019) She feels stress eating is well controlled. Azyah feels Bupropion is helping with stress eating.   She shows no sign of suicidal or homicidal ideations.   Depression screen [facility] 2/9 03/02/2018 01/26/2018 01/06/2018 11/30/2017 10/19/2017  Decreased Interest 0 0 2 0 1  Down, Depressed, Hopeless 0 0 2 1 2   PHQ - 2 Score 0 0 4 1 3   Altered sleeping 0 0 0 1 2  Tired, decreased energy 1 1 2 1 2   Change in appetite 1 1 3  0 2  Feeling bad or failure about yourself  0 0 0 1 2  Trouble concentrating 0 0 1 0 2  Moving slowly or fidgety/restless 0 2 0 1 2  Suicidal thoughts 0 0 0 0 0  PHQ-9 Score 2 4 10 5 15   Difficult doing work/chores Not difficult at all - Somewhat difficult Somewhat difficult Somewhat difficult    Insulin Resistance Keirra has a diagnosis of insulin resistance based on her elevated fasting insulin level >5. Although Emerlyn's blood glucose readings are still under good control, insulin resistance puts her at greater risk of metabolic syndrome and diabetes. She is taking metformin currently and continues to work on diet and exercise to decrease risk of diabetes. She denies polyphagia.  Annamay reports rare diarrhea. She reports GI upset when not sticking to meal plan as well.  ASSESSMENT AND PLAN:  Insulin resistance  Other depression - with emotional eating   Class 2 severe obesity with serious comorbidity and body mass index (BMI) of 38.0 to 38.9 in adult, unspecified obesity type (HCC)  PLAN:  Depression with Emotional Eating Behaviors We discussed behavior modification techniques today to help Alyha deal with her emotional eating and depression. Mariselda agrees to continue taking Wellbutrin SR 150 mg qd and follow up with our clinic in 2 weeks.  Insulin Resistance Kaylan will continue to work on weight loss, exercise, and decreasing simple carbohydrates in her diet to help decrease the risk of diabetes. We dicussed metformin including benefits and risks. She was informed that eating too many simple carbohydrates or too many calories at one sitting increases the likelihood of GI side effects. Octa agrees to continue taking metformin for now and prescription was not written today. Leili agrees to follow up with our clinic in 2 weeks. I spent > than 50% of the 15 minute visit on counseling as documented in the note.  Obesity Jenaye is currently in the action stage of change. As such, her goal is to continue with weight loss efforts She has agreed to keep a food journal with 350-500 calories and 35 grams of protein at lunch and follow the Category  3 plan Demetrios IsaacsCaria has been instructed to continue with workout video 30-45 minutes per week for weight loss and overall health benefits. We discussed the following Behavioral Modification Strategies today: increasing lean protein intake, Planning for success, Keep a strict food journal and emotional eating strategies  Demetrios IsaacsCaria has agreed to follow up with our clinic in 2 weeks. She was informed of the importance of frequent follow up visits to maximize her success with intensive lifestyle modifications for her multiple health  conditions.  ALLERGIES: No Known Allergies  MEDICATIONS: Current Outpatient Medications on File Prior to Visit  Medication Sig Dispense Refill  . albuterol (PROVENTIL HFA;VENTOLIN HFA) 108 (90 Base) MCG/ACT inhaler Inhale 2 puffs into the lungs every 6 (six) hours as needed for wheezing or shortness of breath. 1 Inhaler 0  . APRI 0.15-30 MG-MCG tablet TAKE 1 TABLET BY MOUTH EVERY DAY 84 tablet 2  . atenolol (TENORMIN) 25 MG tablet TAKE 1 TABLET BY MOUTH EVERY EVENING FOR BP AND ANXIETY 90 tablet 1  . buPROPion (WELLBUTRIN XL) 150 MG 24 hr tablet Take 1 tablet (150 mg total) by mouth daily. 90 tablet 1  . Cholecalciferol (VITAMIN D) 2000 UNITS CAPS Take 1 capsule by mouth daily.    Marland Kitchen. desoximetasone (TOPICORT) 0.25 % cream Apply 1 application topically 2 (two) times daily. 100 g 0  . enalapril (VASOTEC) 5 MG tablet Take 1 tablet by mouth daily.    . fluticasone (FLONASE) 50 MCG/ACT nasal spray Place 2 sprays into both nostrils at bedtime. 48 g 1  . ketoconazole (NIZORAL) 2 % cream Apply 1 application topically at bedtime. 60 g 0  . linaclotide (LINZESS) 72 MCG capsule Take 1 capsule (72 mcg total) daily before breakfast by mouth. 90 capsule 1  . loratadine (CLARITIN) 10 MG tablet Take 1 tablet (10 mg total) by mouth daily. 90 tablet 1  . metFORMIN (GLUCOPHAGE) 500 MG tablet Take 1 tablet (500 mg total) by mouth daily with breakfast. 90 tablet 1  . montelukast (SINGULAIR) 10 MG tablet Take 1 tablet (10 mg total) by mouth at bedtime. 90 tablet 1  . valACYclovir (VALTREX) 500 MG tablet Take 1 tablet (500 mg total) by mouth daily. And twice daily on outbreaks 115 tablet 0  . Vitamin D, Ergocalciferol, (DRISDOL) 1.25 MG (50000 UT) CAPS capsule Take 1 capsule (50,000 Units total) by mouth every 7 (seven) days. 12 capsule 0   No current facility-administered medications on file prior to visit.     PAST MEDICAL HISTORY: Past Medical History:  Diagnosis Date  . Acne   . Allergy   . Anxiety   .  Asthma   . Chronic constipation   . Chronic dermatitis   . Constipation   . Depression   . Dermatitis   . Dysmetabolic syndrome   . Genital herpes   . Hypertension   . Irritable bowel syndrome without diarrhea   . Kidney disease   . Morbid obesity with BMI of 40.0-44.9, adult (HCC)   . Panic disorder   . PCOS (polycystic ovarian syndrome)   . Pre-diabetes   . Proteinuria   . Stomach ulcer   . Vitamin D deficiency     PAST SURGICAL HISTORY: History reviewed. No pertinent surgical history.  SOCIAL HISTORY: Social History   Tobacco Use  . Smoking status: Never Smoker  . Smokeless tobacco: Never Used  Substance Use Topics  . Alcohol use: Yes    Alcohol/week: 0.0 standard drinks    Comment: seldom  . Drug  use: No    FAMILY HISTORY: Family History  Problem Relation Age of Onset  . Hypertension Mother   . Obesity Mother   . Cancer Father   . Diabetes Father   . Kidney disease Father   . Obesity Maternal Grandmother     ROS: Review of Systems  Constitutional: Negative for weight loss.  Endo/Heme/Allergies:       Negative for polyphagia  Psychiatric/Behavioral: Positive for depression. Negative for suicidal ideas.       Negative for homicidal ideation    PHYSICAL EXAM: Blood pressure 124/82, pulse 69, temperature 97.8 F (36.6 C), temperature source Oral, height 5\' 7"  (1.702 m), weight 246 lb (111.6 kg), last menstrual period 05/10/2018, SpO2 99 %. Body mass index is 38.53 kg/m. Physical Exam Vitals signs reviewed.  Constitutional:      Appearance: Normal appearance. She is obese.  Cardiovascular:     Rate and Rhythm: Normal rate.     Pulses: Normal pulses.  Pulmonary:     Effort: Pulmonary effort is normal.  Musculoskeletal: Normal range of motion.  Skin:    General: Skin is warm and dry.  Neurological:     Mental Status: She is alert and oriented to person, place, and time.  Psychiatric:        Mood and Affect: Mood normal.        Behavior:  Behavior normal.     RECENT LABS AND TESTS: BMET    Component Value Date/Time   NA 139 03/28/2018 1004   K 4.3 03/28/2018 1004   CL 106 03/28/2018 1004   CO2 18 (L) 03/28/2018 1004   GLUCOSE 92 03/28/2018 1004   GLUCOSE 82 10/19/2017 0950   BUN 11 03/28/2018 1004   CREATININE 0.83 03/28/2018 1004   CREATININE 0.83 10/19/2017 0950   CALCIUM 9.2 03/28/2018 1004   GFRNONAA 100 03/28/2018 1004   GFRNONAA 99 10/19/2017 0950   GFRAA 115 03/28/2018 1004   GFRAA 115 10/19/2017 0950   Lab Results  Component Value Date   HGBA1C 5.5 03/28/2018   HGBA1C 5.7 (H) 10/19/2017   HGBA1C 5.5 03/13/2016   Lab Results  Component Value Date   INSULIN 34.6 (H) 03/28/2018   CBC    Component Value Date/Time   WBC 9.0 10/19/2017 0950   RBC 4.60 10/19/2017 0950   HGB 12.4 10/19/2017 0950   HCT 37.5 10/19/2017 0950   PLT 243 10/19/2017 0950   MCV 81.5 10/19/2017 0950   MCH 27.0 10/19/2017 0950   MCHC 33.1 10/19/2017 0950   RDW 12.7 10/19/2017 0950   LYMPHSABS 2,448 10/19/2017 0950   MONOABS 935 07/21/2016 1215   EOSABS 297 10/19/2017 0950   BASOSABS 36 10/19/2017 0950   Iron/TIBC/Ferritin/ %Sat No results found for: IRON, TIBC, FERRITIN, IRONPCTSAT Lipid Panel     Component Value Date/Time   CHOL 182 10/19/2017 0950   TRIG 117 10/19/2017 0950   HDL 58 10/19/2017 0950   CHOLHDL 3.1 10/19/2017 0950   VLDL 20 03/13/2016 0001   LDLCALC 103 (H) 10/19/2017 0950   Hepatic Function Panel     Component Value Date/Time   PROT 6.7 03/28/2018 1004   ALBUMIN 3.9 03/28/2018 1004   AST 17 03/28/2018 1004   ALT 11 03/28/2018 1004   ALKPHOS 50 03/28/2018 1004   BILITOT 0.4 03/28/2018 1004      Component Value Date/Time   TSH 1.43 10/19/2017 0950      OBESITY BEHAVIORAL INTERVENTION VISIT  Today's visit was # 9  Starting weight: 254 lbs Starting date: 01/06/2018 Today's weight : 246 lbs Today's date: 05/16/2018 Total lbs lost to date: 8  ASK: We discussed the diagnosis of  obesity with Gladys Damme today and Ethel agreed to give [place] permission to discuss obesity behavioral modification therapy today.  ASSESS: Kayona has the diagnosis of obesity and her BMI today is 38.52 Rafia is in the action stage of change   ADVISE: Tiaria was educated on the multiple health risks of obesity as well as the benefit of weight loss to improve her health. She was advised of the need for long term treatment and the importance of lifestyle modifications to improve her current health and to decrease her risk of future health problems.  AGREE: Multiple dietary modification options and treatment options were discussed and  Shakyia agreed to follow the recommendations documented in the above note.  ARRANGE: Nguyen was educated on the importance of frequent visits to treat obesity as outlined per CMS and USPSTF guidelines and agreed to schedule her next follow up appointment today.  I, Tammy Wysor, am acting as Energy manager for [place], FNP-C.  I have reviewed the above documentation for accuracy and completeness, and I agree with the above.  -  , FNP-C.

## 2018-05-17 ENCOUNTER — Encounter (INDEPENDENT_AMBULATORY_CARE_PROVIDER_SITE_OTHER): Payer: Self-pay

## 2018-05-17 ENCOUNTER — Ambulatory Visit (INDEPENDENT_AMBULATORY_CARE_PROVIDER_SITE_OTHER): Payer: Self-pay | Admitting: Bariatrics

## 2018-05-17 DIAGNOSIS — F321 Major depressive disorder, single episode, moderate: Secondary | ICD-10-CM | POA: Diagnosis not present

## 2018-05-23 ENCOUNTER — Other Ambulatory Visit: Payer: Self-pay | Admitting: Family Medicine

## 2018-05-23 DIAGNOSIS — J4521 Mild intermittent asthma with (acute) exacerbation: Secondary | ICD-10-CM

## 2018-05-30 ENCOUNTER — Other Ambulatory Visit: Payer: Self-pay | Admitting: Family Medicine

## 2018-05-30 DIAGNOSIS — E559 Vitamin D deficiency, unspecified: Secondary | ICD-10-CM

## 2018-05-31 ENCOUNTER — Other Ambulatory Visit: Payer: Self-pay | Admitting: Family Medicine

## 2018-05-31 DIAGNOSIS — F321 Major depressive disorder, single episode, moderate: Secondary | ICD-10-CM | POA: Diagnosis not present

## 2018-06-01 ENCOUNTER — Ambulatory Visit (INDEPENDENT_AMBULATORY_CARE_PROVIDER_SITE_OTHER): Payer: Federal, State, Local not specified - PPO | Admitting: Family Medicine

## 2018-06-01 ENCOUNTER — Encounter (INDEPENDENT_AMBULATORY_CARE_PROVIDER_SITE_OTHER): Payer: Self-pay | Admitting: Family Medicine

## 2018-06-01 VITALS — BP 146/82 | HR 60 | Temp 98.0°F | Ht 67.0 in | Wt 245.0 lb

## 2018-06-01 DIAGNOSIS — Z6838 Body mass index (BMI) 38.0-38.9, adult: Secondary | ICD-10-CM

## 2018-06-01 DIAGNOSIS — I10 Essential (primary) hypertension: Secondary | ICD-10-CM | POA: Diagnosis not present

## 2018-06-01 NOTE — Progress Notes (Signed)
Office: [phone]  /  Fax: [phone]   HPI:   Chief Complaint: OBESITY Rachel Dunlap is here to discuss her progress with her obesity treatment plan. She is on the keep a food journal with 350-500 calories and 35 grams of protein at lunch daily and follow the Category 3 plan and is following her eating plan approximately 40 % of the time. She states she is exercising with videos on Youtube for 30-40 minutes 3 times per week. Rachel Dunlap is not always getting a lunch break at work. Her schedule will change next week and she will be able to get lunch breaks.  Her weight is 245 lb (111.1 kg) today and has had a weight loss of 1 pound over a period of 2 weeks since her last visit. She has lost 9 lbs since starting treatment with [place].  Hypertension Rachel Dunlap is a 23 y.o. female with hypertension. Rachel Dunlap's blood pressure is elevated today. She did not take her medications today. She denies chest pain or shortness of breath. She is working on weight loss to help control her blood pressure with the goal of decreasing her risk of heart attack and stroke. Rachel Dunlap's blood pressure is not currently controlled.  ASSESSMENT AND PLAN:  Essential hypertension  Class 2 severe obesity with serious comorbidity and body mass index (BMI) of 38.0 to 38.9 in adult, unspecified obesity type (HCC)  PLAN:  Hypertension We discussed sodium restriction, working on healthy weight loss, and a regular exercise program as the means to achieve improved blood pressure control. Alaynah agreed with this plan and agreed to follow up as directed. We will continue to monitor her blood pressure as well as her progress with the above lifestyle modifications. Rachel Dunlap will take her medication when she gets home and we discussed strategies for better compliance. She will watch for signs of hypotension as she continues her lifestyle modifications. Rachel Dunlap agrees to follow up with our clinic in 4 weeks.  I spent > than 50% of the 15 minute visit  on counseling as documented in the note.  Obesity Rachel Dunlap is currently in the action stage of change. As such, her goal is to continue with weight loss efforts She has agreed to keep a food journal with 350-500 calories and 35 grams of protein at lunch daily and follow the Category 3 plan Rachel Dunlap will continue current exercise regimen for weight loss and overall health benefits. We discussed the following Behavioral Modification Strategies today: increasing lean protein intake, no skipping meals, and planning for success   Rachel Dunlap has agreed to follow up with our clinic in 4 weeks. She was informed of the importance of frequent follow up visits to maximize her success with intensive lifestyle modifications for her multiple health conditions.  ALLERGIES: No Known Allergies  MEDICATIONS: Current Outpatient Medications on File Prior to Visit  Medication Sig Dispense Refill  . albuterol (PROVENTIL HFA;VENTOLIN HFA) 108 (90 Base) MCG/ACT inhaler TAKE 2 PUFFS BY MOUTH EVERY 6 HOURS AS NEEDED FOR WHEEZE OR SHORTNESS OF BREATH 6.7 Inhaler 0  . APRI 0.15-30 MG-MCG tablet TAKE 1 TABLET BY MOUTH EVERY DAY 84 tablet 2  . atenolol (TENORMIN) 25 MG tablet TAKE 1 TABLET BY MOUTH EVERY EVENING FOR BP AND ANXIETY 90 tablet 1  . buPROPion (WELLBUTRIN XL) 150 MG 24 hr tablet Take 1 tablet (150 mg total) by mouth daily. 90 tablet 1  . Cholecalciferol (VITAMIN D) 2000 UNITS CAPS Take 1 capsule by mouth daily.    Marland Kitchen desoximetasone (TOPICORT) 0.25 %  cream Apply 1 application topically 2 (two) times daily. 100 g 0  . enalapril (VASOTEC) 5 MG tablet Take 1 tablet by mouth daily.    . fluticasone (FLONASE) 50 MCG/ACT nasal spray Place 2 sprays into both nostrils at bedtime. 48 g 1  . ketoconazole (NIZORAL) 2 % cream Apply 1 application topically at bedtime. 60 g 0  . linaclotide (LINZESS) 72 MCG capsule Take 1 capsule (72 mcg total) daily before breakfast by mouth. 90 capsule 1  . loratadine (CLARITIN) 10 MG tablet Take  1 tablet (10 mg total) by mouth daily. 90 tablet 1  . metFORMIN (GLUCOPHAGE) 500 MG tablet Take 1 tablet (500 mg total) by mouth daily with breakfast. 90 tablet 1  . montelukast (SINGULAIR) 10 MG tablet Take 1 tablet (10 mg total) by mouth at bedtime. 90 tablet 1  . valACYclovir (VALTREX) 500 MG tablet Take 1 tablet (500 mg total) by mouth daily. And twice daily on outbreaks 115 tablet 0  . Vitamin D, Ergocalciferol, (DRISDOL) 1.25 MG (50000 UT) CAPS capsule TAKE 1 CAPSULE (50,000 UNITS TOTAL) BY MOUTH EVERY 7 (SEVEN) DAYS. 12 capsule 0   No current facility-administered medications on file prior to visit.     PAST MEDICAL HISTORY: Past Medical History:  Diagnosis Date  . Acne   . Allergy   . Anxiety   . Asthma   . Chronic constipation   . Chronic dermatitis   . Constipation   . Depression   . Dermatitis   . Dysmetabolic syndrome   . Genital herpes   . Hypertension   . Irritable bowel syndrome without diarrhea   . Kidney disease   . Morbid obesity with BMI of 40.0-44.9, adult (HCC)   . Panic disorder   . PCOS (polycystic ovarian syndrome)   . Pre-diabetes   . Proteinuria   . Stomach ulcer   . Vitamin D deficiency     PAST SURGICAL HISTORY: History reviewed. No pertinent surgical history.  SOCIAL HISTORY: Social History   Tobacco Use  . Smoking status: Never Smoker  . Smokeless tobacco: Never Used  Substance Use Topics  . Alcohol use: Yes    Alcohol/week: 0.0 standard drinks    Comment: seldom  . Drug use: No    FAMILY HISTORY: Family History  Problem Relation Age of Onset  . Hypertension Mother   . Obesity Mother   . Cancer Father   . Diabetes Father   . Kidney disease Father   . Obesity Maternal Grandmother     ROS: Review of Systems  Constitutional: Positive for weight loss.  Respiratory: Negative for shortness of breath.   Cardiovascular: Negative for chest pain.    PHYSICAL EXAM: Blood pressure (!) 146/82, pulse 60, temperature 98 F (36.7  C), height  (1.702 m), weight 245 lb (111.1 kg), last menstrual period 05/07/2018, SpO2 100 %. Body mass index is 38.37 kg/m. Physical Exam Vitals signs reviewed.  Constitutional:      Appearance: Normal appearance. She is obese.  Cardiovascular:     Rate and Rhythm: Normal rate.     Pulses: Normal pulses.  Pulmonary:     Effort: Pulmonary effort is normal.     Breath sounds: Normal breath sounds.  Musculoskeletal: Normal range of motion.  Skin:    General: Skin is warm and dry.  Neurological:     Mental Status: She is alert and oriented to person, place, and time.  Psychiatric:        Mood and Affect:  Mood normal.        Behavior: Behavior normal.     RECENT LABS AND TESTS: BMET    Component Value Date/Time   NA 139 03/28/2018 1004   K 4.3 03/28/2018 1004   CL 106 03/28/2018 1004   CO2 18 (L) 03/28/2018 1004   GLUCOSE 92 03/28/2018 1004   GLUCOSE 82 10/19/2017 0950   BUN 11 03/28/2018 1004   CREATININE 0.83 03/28/2018 1004   CREATININE 0.83 10/19/2017 0950   CALCIUM 9.2 03/28/2018 1004   GFRNONAA 100 03/28/2018 1004   GFRNONAA 99 10/19/2017 0950   GFRAA 115 03/28/2018 1004   GFRAA 115 10/19/2017 0950   Lab Results  Component Value Date   HGBA1C 5.5 03/28/2018   HGBA1C 5.7 (H) 10/19/2017   HGBA1C 5.5 03/13/2016   Lab Results  Component Value Date   INSULIN 34.6 (H) 03/28/2018   CBC    Component Value Date/Time   WBC 9.0 10/19/2017 0950   RBC 4.60 10/19/2017 0950   HGB 12.4 10/19/2017 0950   HCT 37.5 10/19/2017 0950   PLT 243 10/19/2017 0950   MCV 81.5 10/19/2017 0950   MCH 27.0 10/19/2017 0950   MCHC 33.1 10/19/2017 0950   RDW 12.7 10/19/2017 0950   LYMPHSABS 2,448 10/19/2017 0950   MONOABS 935 07/21/2016 1215   EOSABS 297 10/19/2017 0950   BASOSABS 36 10/19/2017 0950   Iron/TIBC/Ferritin/ %Sat No results found for: IRON, TIBC, FERRITIN, IRONPCTSAT Lipid Panel     Component Value Date/Time   CHOL 182 10/19/2017 0950   TRIG 117  10/19/2017 0950   HDL 58 10/19/2017 0950   CHOLHDL 3.1 10/19/2017 0950   VLDL 20 03/13/2016 0001   LDLCALC 103 (H) 10/19/2017 0950   Hepatic Function Panel     Component Value Date/Time   PROT 6.7 03/28/2018 1004   ALBUMIN 3.9 03/28/2018 1004   AST 17 03/28/2018 1004   ALT 11 03/28/2018 1004   ALKPHOS 50 03/28/2018 1004   BILITOT 0.4 03/28/2018 1004      Component Value Date/Time   TSH 1.43 10/19/2017 0950      OBESITY BEHAVIORAL INTERVENTION VISIT  Today's visit was # 10   Starting weight: 254 lbs Starting date: 01/06/18 Today's weight : 245 lbs Today's date: 06/01/2018 Total lbs lost to date: 9    06/01/2018  Height 5\' 7"  (1.702 m)  Weight 245 lb (111.1 kg)  BMI (Calculated) 38.36  BLOOD PRESSURE - SYSTOLIC 146  BLOOD PRESSURE - DIASTOLIC 82   Body Fat % 40.7 %  Total Body Water (lbs) 97.6 lbs     ASK: We discussed the diagnosis of obesity with Gladys Damme today and Jannis agreed to give [place] permission to discuss obesity behavioral modification therapy today.  ASSESS: Tanique has the diagnosis of obesity and her BMI today is 38.36 Scotlynn is in the action stage of change   ADVISE: Emon was educated on the multiple health risks of obesity as well as the benefit of weight loss to improve her health. She was advised of the need for long term treatment and the importance of lifestyle modifications to improve her current health and to decrease her risk of future health problems.  AGREE: Multiple dietary modification options and treatment options were discussed and  Macel agreed to follow the recommendations documented in the above note.  ARRANGE: Braelee was educated on the importance of frequent visits to treat obesity as outlined per CMS and USPSTF guidelines and agreed to schedule her next follow  up appointment today.  Trude Mcburney, am acting as Energy manager for [place], FNP-C.  I have reviewed the above documentation for accuracy and completeness,  and I agree with the above.  - Ormond Lazo, FNP-C.

## 2018-06-02 ENCOUNTER — Encounter (INDEPENDENT_AMBULATORY_CARE_PROVIDER_SITE_OTHER): Payer: Self-pay | Admitting: Family Medicine

## 2018-06-06 ENCOUNTER — Other Ambulatory Visit: Payer: Self-pay | Admitting: Family Medicine

## 2018-06-06 DIAGNOSIS — A6004 Herpesviral vulvovaginitis: Secondary | ICD-10-CM

## 2018-06-18 ENCOUNTER — Other Ambulatory Visit: Payer: Self-pay | Admitting: Family Medicine

## 2018-06-18 DIAGNOSIS — J4521 Mild intermittent asthma with (acute) exacerbation: Secondary | ICD-10-CM

## 2018-06-20 DIAGNOSIS — F321 Major depressive disorder, single episode, moderate: Secondary | ICD-10-CM | POA: Diagnosis not present

## 2018-06-29 ENCOUNTER — Ambulatory Visit (INDEPENDENT_AMBULATORY_CARE_PROVIDER_SITE_OTHER): Payer: Self-pay | Admitting: Family Medicine

## 2018-06-29 ENCOUNTER — Encounter (INDEPENDENT_AMBULATORY_CARE_PROVIDER_SITE_OTHER): Payer: Self-pay

## 2018-07-04 ENCOUNTER — Encounter (INDEPENDENT_AMBULATORY_CARE_PROVIDER_SITE_OTHER): Payer: Self-pay

## 2018-07-13 ENCOUNTER — Other Ambulatory Visit: Payer: Self-pay | Admitting: Family Medicine

## 2018-07-13 DIAGNOSIS — E559 Vitamin D deficiency, unspecified: Secondary | ICD-10-CM

## 2018-07-13 MED ORDER — VITAMIN D (ERGOCALCIFEROL) 1.25 MG (50000 UNIT) PO CAPS: 50000.0000 [IU] | ORAL_CAPSULE | ORAL | 0 refills | Status: DC

## 2018-08-31 ENCOUNTER — Ambulatory Visit: Payer: Federal, State, Local not specified - PPO | Admitting: Family Medicine

## 2018-08-31 ENCOUNTER — Telehealth: Payer: Self-pay

## 2018-08-31 ENCOUNTER — Other Ambulatory Visit: Payer: Self-pay

## 2018-08-31 ENCOUNTER — Encounter: Payer: Self-pay | Admitting: Family Medicine

## 2018-08-31 VITALS — BP 130/82 | HR 96 | Temp 98.4°F | Resp 14 | Ht 67.5 in | Wt 252.9 lb

## 2018-08-31 DIAGNOSIS — K5909 Other constipation: Secondary | ICD-10-CM

## 2018-08-31 DIAGNOSIS — I10 Essential (primary) hypertension: Secondary | ICD-10-CM | POA: Diagnosis not present

## 2018-08-31 DIAGNOSIS — F331 Major depressive disorder, recurrent, moderate: Secondary | ICD-10-CM

## 2018-08-31 DIAGNOSIS — J302 Other seasonal allergic rhinitis: Secondary | ICD-10-CM

## 2018-08-31 DIAGNOSIS — E559 Vitamin D deficiency, unspecified: Secondary | ICD-10-CM

## 2018-08-31 DIAGNOSIS — J3089 Other allergic rhinitis: Secondary | ICD-10-CM

## 2018-08-31 DIAGNOSIS — A6004 Herpesviral vulvovaginitis: Secondary | ICD-10-CM

## 2018-08-31 DIAGNOSIS — J4520 Mild intermittent asthma, uncomplicated: Secondary | ICD-10-CM | POA: Diagnosis not present

## 2018-08-31 DIAGNOSIS — E8881 Metabolic syndrome: Secondary | ICD-10-CM

## 2018-08-31 DIAGNOSIS — L309 Dermatitis, unspecified: Secondary | ICD-10-CM | POA: Insufficient documentation

## 2018-08-31 DIAGNOSIS — L308 Other specified dermatitis: Secondary | ICD-10-CM

## 2018-08-31 DIAGNOSIS — E282 Polycystic ovarian syndrome: Secondary | ICD-10-CM

## 2018-08-31 DIAGNOSIS — J4521 Mild intermittent asthma with (acute) exacerbation: Secondary | ICD-10-CM

## 2018-08-31 MED ORDER — VITAMIN D (ERGOCALCIFEROL) 1.25 MG (50000 UNIT) PO CAPS: 50000.0000 [IU] | ORAL_CAPSULE | ORAL | 0 refills | Status: DC

## 2018-08-31 MED ORDER — LORATADINE 10 MG PO TABS: 10.0000 mg | ORAL_TABLET | Freq: Every day | ORAL | 1 refills | Status: DC

## 2018-08-31 MED ORDER — HYDROCORTISONE 0.5 % EX CREA: 1.0000 "application " | TOPICAL_CREAM | Freq: Two times a day (BID) | CUTANEOUS | 0 refills | Status: DC

## 2018-08-31 MED ORDER — METFORMIN HCL 500 MG PO TABS: 500.0000 mg | ORAL_TABLET | Freq: Every day | ORAL | 1 refills | Status: DC

## 2018-08-31 MED ORDER — FLUTICASONE PROPIONATE 50 MCG/ACT NA SUSP: 2.0000 | Freq: Every day | NASAL | 1 refills | Status: DC

## 2018-08-31 MED ORDER — POLYETHYLENE GLYCOL 3350 17 GM/SCOOP PO POWD: 17.0000 g | Freq: Two times a day (BID) | ORAL | 0 refills | Status: DC | PRN

## 2018-08-31 MED ORDER — ATENOLOL 25 MG PO TABS: ORAL_TABLET | ORAL | 1 refills | Status: DC

## 2018-08-31 MED ORDER — BUPROPION HCL ER (XL) 150 MG PO TB24: 150.0000 mg | ORAL_TABLET | Freq: Every day | ORAL | 1 refills | Status: DC

## 2018-08-31 MED ORDER — ALBUTEROL SULFATE HFA 108 (90 BASE) MCG/ACT IN AERS: 2.0000 | INHALATION_SPRAY | Freq: Four times a day (QID) | RESPIRATORY_TRACT | 0 refills | Status: DC | PRN

## 2018-08-31 MED ORDER — PLECANATIDE 3 MG PO TABS: 1.0000 | ORAL_TABLET | Freq: Every day | ORAL | 1 refills | Status: DC

## 2018-08-31 MED ORDER — MONTELUKAST SODIUM 10 MG PO TABS: 10.0000 mg | ORAL_TABLET | Freq: Every day | ORAL | 1 refills | Status: DC

## 2018-08-31 MED ORDER — VALACYCLOVIR HCL 500 MG PO TABS: 500.0000 mg | ORAL_TABLET | Freq: Every day | ORAL | 1 refills | Status: DC

## 2018-08-31 NOTE — Patient Instructions (Signed)
Carbohydrate restrictive diet First two weeks try keto diet with max of 20 g or carbohydrates per day After that 30 g per day but no more than 50 g

## 2018-08-31 NOTE — Telephone Encounter (Signed)
Prior Authorization was initiated for Trulance 3 mg tabs and Approved. The medication is approved through 08/01/2018-05/272021.

## 2018-08-31 NOTE — Progress Notes (Signed)
Name: Rachel DammeCaria Dunlap   MRN: [id number]    DOB: 1996-04-03   Date:08/31/2018       Progress Note  Subjective  Chief Complaint  Chief Complaint  Patient presents with  . Follow-up  . Hypertension  . Depression  . Constipation    worse    HPI  Obesity:gyms are closed and has not been exercising except walking at work.  She could not afford Belviq or Ozempic. She is [facility], she has been counting calories, not drinking any sweet beverages but has not lost any weight since 6 months ago and is getting frustrated. Discussed carbohydrate restrictive diet and she will discuss it with them or return here for follow up. She has insulin resistance and may be a very good option for her. She may also consider intermittent fasting.   Depression Major/grieving: shenever properly grieved the loss of her father that died 12/2016, followed by losing her  her half - brother 06/2019so felt much worse. Taking Wellbutrin XL 150 mg and she was seeing  counselor Ova FreshwaterJennifer Wilson however she stopped since COVID-19. She states not crying anymore, very excited about finishing school Dec 2019 getting degree in hospitality and tourism, and is planning on IT consultantfinishing culinary degree at [employer] Fall of 2020. She states phq9 likely worse because of COVID-19 and too much time in her hands, also secondary to inability to lose weight.   HTN: bp is okay, she  denies sob or chest pain. Also sees Dr. Ronn MelenaKolloru and is taking vasotec because of microalbuminuria. She is aware of teratogenic effects of ACE She is also on Atenolol   Pre-diabetes: she denies polyphagia, polydipsia or polyuria. Fasting insulin elevated, discussed dietary modifications  AR: doing well at this time, taking medications, no rhinorrhea or congestion  Asthma: she is taking singulair daily and has not needed to use rescue inhaler, denies wheezing, cough or SOB. Unchanged   Constipation: long history of constipation, not  responding to any otc powders or pills, she was given linzess in the past and was not able to afford it. Bowel movements are at most 2-3 times a week with episodes of going up to one week without a bowel movement. She has to strain at times, no blood in stools. Bristol scale 3 recently   Patient Active Problem List   Diagnosis Date Noted  . Insulin resistance 05/16/2018  . Depression 05/16/2018  . Prediabetes 02/14/2018  . Essential hypertension 02/14/2018  . Class 2 severe obesity with serious comorbidity and body mass index (BMI) of 39.0 to 39.9 in adult (HCC) 02/14/2018  . Moderate recurrent major depression (HCC) 02/16/2017  . Back pain due to injury 09/18/2016  . Acne vulgaris 09/16/2015  . Hypertrichosis 09/16/2015  . Chronic constipation 11/09/2014  . Chronic dermatitis 11/09/2014  . Dysmetabolic syndrome 11/09/2014  . Hypertension, benign 11/09/2014  . Genital herpes in women 11/09/2014  . Mild intermittent asthma 11/09/2014  . Allergic rhinitis, seasonal 11/09/2014  . Class 2 severe obesity with serious comorbidity and body mass index (BMI) of 38.0 to 38.9 in adult (HCC) 11/09/2014  . IBS (irritable bowel syndrome) 11/09/2014  . Bilateral polycystic ovarian syndrome 11/09/2014  . Abnormal presence of protein in urine 11/09/2014  . Vitamin D deficiency 11/09/2014  . Anxiety 11/09/2014    History reviewed. No pertinent surgical history.  Family History  Problem Relation Age of Onset  . Hypertension Mother   . Obesity Mother   . Cancer Father   . Diabetes  Father   . Kidney disease Father   . Obesity Maternal Grandmother     Social History   Socioeconomic History  . Marital status: Single    Spouse name: Not on file  . Number of children: 0  . Years of education: Not on file  . Highest education level: Some college, no degree  Occupational History  . Occupation: gas station attendant.     Comment: [employer]  Social Needs  . Financial resource strain: Somewhat hard   . Food insecurity:    Worry: Never true    Inability: Never true  . Transportation needs:    Medical: No    Non-medical: No  Tobacco Use  . Smoking status: Never Smoker  . Smokeless tobacco: Never Used  Substance and Sexual Activity  . Alcohol use: Yes    Alcohol/week: 0.0 standard drinks    Comment: seldom  . Drug use: No  . Sexual activity: Not Currently    Partners: Male    Birth control/protection: Pill, Abstinence  Lifestyle  . Physical activity:    Days per week: 2 days    Minutes per session: 60 min  . Stress: Not at all  Relationships  . Social connections:    Talks on phone: More than three times a week    Gets together: More than three times a week    Attends religious service: More than 4 times per year    Active member of club or organization: Yes    Attends meetings of clubs or organizations: More than 4 times per year    Relationship status: Never married  . Intimate partner violence:    Fear of current or ex partner: No    Emotionally abused: No    Physically abused: No    Forced sexual activity: No  Other Topics Concern  . 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  albuterol (PROVENTIL HFA;VENTOLIN HFA) 108 (90 Base) MCG/ACT inhaler, TAKE 2 PUFFS BY MOUTH EVERY 6 HOURS AS NEEDED FOR WHEEZE OR SHORTNESS OF BREATH, Disp: 6.7 Inhaler, Rfl: 0 .  APRI 0.15-30 MG-MCG tablet, TAKE 1 TABLET BY MOUTH EVERY DAY, Disp: 84 tablet, Rfl: 2 .  atenolol (TENORMIN) 25 MG tablet, TAKE 1 TABLET BY MOUTH EVERY EVENING FOR BP AND ANXIETY, Disp: 90 tablet, Rfl: 1 .  buPROPion (WELLBUTRIN XL) 150 MG 24 hr tablet, Take 1 tablet (150 mg total) by mouth daily., Disp: 90 tablet, Rfl: 1 .  desoximetasone (TOPICORT) 0.25 % cream, Apply 1 application  topically 2 (two) times daily., Disp: 100 g, Rfl: 0 .  enalapril (VASOTEC) 5 MG tablet, Take 1 tablet by mouth daily., Disp: , Rfl:  .  fluticasone (FLONASE) 50 MCG/ACT nasal spray, Place 2 sprays into both nostrils at bedtime., Disp: 48 g, Rfl: 1 .  ketoconazole (NIZORAL) 2 % cream, Apply 1 application topically at bedtime., Disp: 60 g, Rfl: 0 .  loratadine (CLARITIN) 10 MG tablet, Take 1 tablet (10 mg total) by mouth daily., Disp: 90 tablet, Rfl: 1 .  metFORMIN (GLUCOPHAGE) 500 MG tablet, Take 1 tablet (500 mg total) by mouth daily with breakfast., Disp: 90 tablet, Rfl: 1 .  montelukast (SINGULAIR) 10 MG tablet,  Take 1 tablet (10 mg total) by mouth at bedtime., Disp: 90 tablet, Rfl: 1 .  valACYclovir (VALTREX) 500 MG tablet, TAKE 1 TABLET (500 MG TOTAL) BY MOUTH DAILY. AND TWICE DAILY ON OUTBREAKS, Disp: 115 tablet, Rfl: 0 .  Vitamin D, Ergocalciferol, (DRISDOL) 1.25 MG (50000 UT) CAPS capsule, Take 1 capsule (50,000 Units total) by mouth every 7 (seven) days., Disp: 12 capsule, Rfl: 0 .  Cholecalciferol (VITAMIN D) 2000 UNITS CAPS, Take 1 capsule by mouth daily., Disp: , Rfl:  .  linaclotide (LINZESS) 72 MCG capsule, Take 1 capsule (72 mcg total) daily before breakfast by mouth. (Patient not taking: Reported on 08/31/2018), Disp: 90 capsule, Rfl: 1  No Known Allergies  I personally reviewed active problem list, medication list, allergies, family history, social history with the patient/caregiver today.   ROS  Constitutional: Negative for fever or weight change.  Respiratory: Negative for cough and shortness of breath.   Cardiovascular: Negative for chest pain or palpitations.  Gastrointestinal:positive for intermittent  abdominal pain, no bowel changes.  Musculoskeletal: Negative for gait problem or joint swelling.  Skin: Positive  for itchy rash on her neck .  Neurological: Negative for dizziness or headache.  No other specific complaints in a complete review of systems (except as listed  in HPI above).   Objective  Vitals:   08/31/18 0949  BP: 130/82  Pulse: 96  Resp: 14  Temp: 98.4 F (36.9 C)  TempSrc: Oral  SpO2: 99%  Weight: 252 lb 14.4 oz (114.7 kg)  Height: 5' 7.5" (1.715 m)    Body mass index is 39.03 kg/m.  Physical Exam  Constitutional: Patient appears well-developed and well-nourished. Obese  No distress.  HEENT: head atraumatic, normocephalic, pupils equal and reactive to light,  neck supple, throat within normal limits Cardiovascular: Normal rate, regular rhythm and normal heart sounds.  No murmur heard. No BLE edema. Pulmonary/Chest: Effort normal and breath sounds normal. No respiratory distress. Abdominal: Soft.  There is no tenderness. Skin: eczematous patches on anterior neck  Psychiatric: Patient has a normal mood and affect. behavior is normal. Judgment and thought content normal.   PHQ2/9: Depression screen [facility] 2/9 08/31/2018 03/02/2018 01/26/2018 01/06/2018 11/30/2017  Decreased Interest 1 0 0 2 0  Down, Depressed, Hopeless 2 0 0 2 1  PHQ - 2 Score 3 0 0 4 1  Altered sleeping 1 0 0 0 1  Tired, decreased energy Change in appetite 0  Feeling bad or failure about yourself  0 0 0 0 1  Trouble concentrating 0 0 0 1 0  Moving slowly or fidgety/restless 0 0 2 0 1  Suicidal thoughts 0 0 0 0 0  PHQ-9 Score Difficult doing work/chores Somewhat difficult Not difficult at all - Somewhat difficult Somewhat difficult    phq 9 is positive   Fall Risk: Fall Risk  08/31/2018 03/02/2018 11/30/2017 10/19/2017 03/20/2017  Falls in the past year? 0 0 No No No  Number falls in past yr: 0 0 - - -  Injury with Fall? 0 - - - -     Functional Status Survey: Is the patient deaf or have difficulty hearing?: No Does the patient have difficulty seeing, even when wearing glasses/contacts?: No Does the patient have difficulty concentrating, remembering, or making decisions?: No Does the patient have difficulty walking or  climbing stairs?: No Does the patient have difficulty dressing or bathing?: No Does  the patient have difficulty doing errands alone such as visiting a doctor's office or shopping?: No    Assessment & Plan  1. Hypertension, benign  - atenolol (TENORMIN) 25 MG tablet; TAKE 1 TABLET BY MOUTH EVERY EVENING FOR BP AND ANXIETY  Dispense: 90 tablet; Refill: 1  2. Metabolic syndrome  - metFORMIN (GLUCOPHAGE) 500 MG tablet; Take 1 tablet (500 mg total) by mouth daily with breakfast.  Dispense: 90 tablet; Refill: 1  3. Mild intermittent asthma, uncomplicated  - montelukast (SINGULAIR) 10 MG tablet; Take 1 tablet (10 mg total) by mouth at bedtime.  Dispense: 90 tablet; Refill: 1  4. Bilateral polycystic ovarian syndrome  - metFORMIN (GLUCOPHAGE) 500 MG tablet; Take 1 tablet (500 mg total) by mouth daily with breakfast.  Dispense: 90 tablet; Refill: 1  5. Moderate recurrent major depression (HCC)  - buPROPion (WELLBUTRIN XL) 150 MG 24 hr tablet; Take 1 tablet (150 mg total) by mouth daily.  Dispense: 90 tablet; Refill: 1  6. Perennial allergic rhinitis with seasonal variation  - loratadine (CLARITIN) 10 MG tablet; Take 1 tablet (10 mg total) by mouth daily.  Dispense: 90 tablet; Refill: 1 - fluticasone (FLONASE) 50 MCG/ACT nasal spray; Place 2 sprays into both nostrils at bedtime.  Dispense: 48 g; Refill: 1  7. Vitamin D deficiency  - Vitamin D, Ergocalciferol, (DRISDOL) 1.25 MG (50000 UT) CAPS capsule; Take 1 capsule (50,000 Units total) by mouth every 7 (seven) days.  Dispense: 12 capsule; Refill: 0  8. Chronic constipation  - Plecanatide (TRULANCE) 3 MG TABS; Take 1 tablet by mouth daily.  Dispense: 90 tablet; Refill: 1  We will also add miralax   9. Mild intermittent asthma with acute exacerbation  - albuterol (VENTOLIN HFA) 108 (90 Base) MCG/ACT inhaler; Inhale 2 puffs into the lungs every 6 (six) hours as needed for wheezing or shortness of breath.  Dispense: 6.7 Inhaler;  Refill: 0  10. Herpes simplex vulvovaginitis  - valACYclovir (VALTREX) 500 MG tablet; Take 1 tablet (500 mg total) by mouth daily. And twice daily on outbreaks  Dispense: 115 tablet; Refill: 1.  11. Morbid obesity (HCC)  Going to weight loss center, not losing weight, discussed carbohydrate  Restrictive diet   12. Other eczema  - hydrocortisone cream 0.5 %; Apply 1 application topically 2 (two) times daily.  Dispense: 30 g; Refill: 0

## 2018-09-05 ENCOUNTER — Other Ambulatory Visit: Payer: Self-pay | Admitting: Family Medicine

## 2018-09-05 DIAGNOSIS — L308 Other specified dermatitis: Secondary | ICD-10-CM

## 2018-09-05 MED ORDER — TRIAMCINOLONE ACETONIDE 0.1 % EX CREA: 1.0000 "application " | TOPICAL_CREAM | Freq: Two times a day (BID) | CUTANEOUS | 0 refills | Status: DC

## 2018-09-22 ENCOUNTER — Ambulatory Visit (INDEPENDENT_AMBULATORY_CARE_PROVIDER_SITE_OTHER): Payer: Federal, State, Local not specified - PPO | Admitting: Family Medicine

## 2018-09-22 ENCOUNTER — Other Ambulatory Visit (HOSPITAL_COMMUNITY)
Admission: RE | Admit: 2018-09-22 | Discharge: 2018-09-22 | Disposition: A | Discharge status: Home / Self Care | Payer: Federal, State, Local not specified - PPO | Source: Ambulatory Visit | Attending: Family Medicine | Admitting: Family Medicine

## 2018-09-22 ENCOUNTER — Encounter: Payer: Self-pay | Admitting: Family Medicine

## 2018-09-22 ENCOUNTER — Other Ambulatory Visit: Payer: Self-pay

## 2018-09-22 VITALS — BP 120/86 | HR 91 | Temp 98.6°F | Resp 16 | Ht 67.0 in | Wt 250.6 lb

## 2018-09-22 DIAGNOSIS — Z1159 Encounter for screening for other viral diseases: Secondary | ICD-10-CM | POA: Diagnosis not present

## 2018-09-22 DIAGNOSIS — Z113 Encounter for screening for infections with a predominantly sexual mode of transmission: Secondary | ICD-10-CM

## 2018-09-22 DIAGNOSIS — Z13220 Encounter for screening for lipoid disorders: Secondary | ICD-10-CM

## 2018-09-22 DIAGNOSIS — E8881 Metabolic syndrome: Secondary | ICD-10-CM

## 2018-09-22 DIAGNOSIS — Z124 Encounter for screening for malignant neoplasm of cervix: Secondary | ICD-10-CM

## 2018-09-22 DIAGNOSIS — Z01419 Encounter for gynecological examination (general) (routine) without abnormal findings: Secondary | ICD-10-CM | POA: Diagnosis not present

## 2018-09-22 DIAGNOSIS — Z130 Encounter for screening for diseases of the blood and blood-forming organs and certain disorders involving the immune mechanism: Secondary | ICD-10-CM

## 2018-09-22 NOTE — Patient Instructions (Addendum)
Start a gratitude journal  Routine is a must, get up at the same time and go to bed at the same time daily Exercise before work at least 30 minutes of heart rate above 150  Preventive Care 18-39 Years, Female Preventive care refers to lifestyle choices and visits with your health care provider that can promote health and wellness. What does preventive care include?   A yearly physical exam. This is also called an annual well check.  Dental exams once or twice a year.  Routine eye exams. Ask your health care provider how often you should have your eyes checked.  Personal lifestyle choices, including: ? Daily care of your teeth and gums. ? Regular physical activity. ? Eating a healthy diet. ? Avoiding tobacco and drug use. ? Limiting alcohol use. ? Practicing safe sex. ? Taking vitamin and mineral supplements as recommended by your health care provider. What happens during an annual well check? The services and screenings done by your health care provider during your annual well check will depend on your age, overall health, lifestyle risk factors, and family history of disease. Counseling Your health care provider may ask you questions about your:  Alcohol use.  Tobacco use.  Drug use.  Emotional well-being.  Home and relationship well-being.  Sexual activity.  Eating habits.  Work and work Statistician.  Method of birth control.  Menstrual cycle.  Pregnancy history. Screening You may have the following tests or measurements:  Height, weight, and BMI.  Diabetes screening. This is done by checking your blood sugar (glucose) after you have not eaten for a while (fasting).  Blood pressure.  Lipid and cholesterol levels. These may be checked every 5 years starting at age 82.  Skin check.  Hepatitis C blood test.  Hepatitis B blood test.  Sexually transmitted disease (STD) testing.  BRCA-related cancer screening. This may be done if you have a family  history of breast, ovarian, tubal, or peritoneal cancers.  Pelvic exam and Pap test. This may be done every 3 years starting at age 83. Starting at age 36, this may be done every 5 years if you have a Pap test in combination with an HPV test. Discuss your test results, treatment options, and if necessary, the need for more tests with your health care provider. Vaccines Your health care provider may recommend certain vaccines, such as:  Influenza vaccine. This is recommended every year.  Tetanus, diphtheria, and acellular pertussis (Tdap, Td) vaccine. You may need a Td booster every 10 years.  Varicella vaccine. You may need this if you have not been vaccinated.  HPV vaccine. If you are 69 or younger, you may need three doses over 6 months.  Measles, mumps, and rubella (MMR) vaccine. You may need at least one dose of MMR. You may also need a second dose.  Pneumococcal 13-valent conjugate (PCV13) vaccine. You may need this if you have certain conditions and were not previously vaccinated.  Pneumococcal polysaccharide (PPSV23) vaccine. You may need one or two doses if you smoke cigarettes or if you have certain conditions.  Meningococcal vaccine. One dose is recommended if you are age 39-21 years and a first-year college student living in a residence hall, or if you have one of several medical conditions. You may also need additional booster doses.  Hepatitis A vaccine. You may need this if you have certain conditions or if you travel or work in places where you may be exposed to hepatitis A.  Hepatitis B vaccine. You  may need this if you have certain conditions or if you travel or work in places where you may be exposed to hepatitis B.  Haemophilus influenzae type b (Hib) vaccine. You may need this if you have certain risk factors. Talk to your health care provider about which screenings and vaccines you need and how often you need them. This information is not intended to replace advice  given to you by your health care provider. Make sure you discuss any questions you have with your health care provider. Document Released: 05/19/2001 Document Revised: 11/03/2016 Document Reviewed: 01/22/2015 Elsevier Interactive Patient Education  2019 [employer].

## 2018-09-22 NOTE — Progress Notes (Signed)
Name: Rachel Dunlap   MRN: [id number]    DOB: 10-May-1995   Date:09/22/2018       Progress Note  Subjective  Chief Complaint  Chief Complaint  Patient presents with  . Annual Exam    HPI   Patient presents for annual CPE   Diet: she is cutting down on carb intake, she has not been able to go down to 20, she has been eating around 50 g Exercise: only at work   USPSTF grade A and B recommendations    Office Visit from 09/22/2018 in [facility]  AUDIT-C Score  0     Depression: Phq 9 is  positive Depression screen [facility] 2/9 09/22/2018 08/31/2018 03/02/2018 01/26/2018 01/06/2018  Decreased Interest 0 1 0 0 2  Down, Depressed, Hopeless 1 2 0 0 2  PHQ - 2 Score 1 3 0 0 4  Altered sleeping 2 1 0 0 0  Tired, decreased energy 2 2 1 1 2   Change in appetite 1 1 1 1 3   Feeling bad or failure about yourself  0 0 0 0 0  Trouble concentrating 0 0 0 0 1  Moving slowly or fidgety/restless 0 0 0 2 0  Suicidal thoughts 0 0 0 0 0  PHQ-9 Score 6 7 2 4 10   Difficult doing work/chores Not difficult at all Somewhat difficult Not difficult at all - Somewhat difficult  Some recent data might be hidden   Hypertension: BP Readings from Last 3 Encounters:  09/22/18 120/86  08/31/18 130/82  06/01/18 (!) 146/82   Obesity: Wt Readings from Last 3 Encounters:  09/22/18 250 lb 9.6 oz (113.7 kg)  08/31/18 252 lb 14.4 oz (114.7 kg)  06/01/18 245 lb (111.1 kg)   BMI Readings from Last 3 Encounters:  09/22/18 39.25 kg/m  08/31/18 39.03 kg/m  06/01/18 38.37 kg/m    Hep C Screening: she will return for labs STD testing and prevention (HIV/chl/gon/syphilis): she agrees on GC, but states not sexually active in years  Intimate partner violence: negative screen  Sexual History/Pain during Intercourse:not in years  Menstrual History/LMP/Abnormal Bleeding:  Incontinence Symptoms:   Advanced Care Planning: A voluntary discussion about advance care planning including the explanation  and discussion of advance directives.  Discussed health care proxy and Living will, and the patient was able to identify a health care proxy as mother .  Patient does not have a living will at present time.  Breast cancer: N/A BRCA gene screening: N/A Cervical cancer screening: we will do it every 5 years since she has not been sexually active  Osteoporosis Prevention: discussed high calcium and vitamin D diet   Lipids:  Lab Results  Component Value Date   CHOL 182 10/19/2017   CHOL 164 03/13/2016   Lab Results  Component Value Date   HDL 58 10/19/2017   HDL 60 03/13/2016   Lab Results  Component Value Date   LDLCALC 103 (H) 10/19/2017   LDLCALC 84 03/13/2016   Lab Results  Component Value Date   TRIG 117 10/19/2017   TRIG 100 03/13/2016   Lab Results  Component Value Date   CHOLHDL 3.1 10/19/2017   CHOLHDL 2.7 03/13/2016   No results found for: LDLDIRECT  Glucose:  Glucose  Date Value Ref Range Status  03/28/2018 92 65 - 99 mg/dL Final   Glucose, Bld  Date Value Ref Range Status  10/19/2017 82 65 - 99 mg/dL Final    Comment:    .  Fasting reference interval .   03/13/2016 89 65 - 99 mg/dL Final    Skin cancer: discussed atypical lesions   Patient Active Problem List   Diagnosis Date Noted  . Eczema 08/31/2018  . Insulin resistance 05/16/2018  . Depression 05/16/2018  . Prediabetes 02/14/2018  . Essential hypertension 02/14/2018  . Class 2 severe obesity with serious comorbidity and body mass index (BMI) of 39.0 to 39.9 in adult ([place]) 02/14/2018  . Moderate recurrent major depression ([place]) 02/16/2017  . Back pain due to injury 09/18/2016  . Acne vulgaris 09/16/2015  . Hypertrichosis 09/16/2015  . Chronic constipation 11/09/2014  . Chronic dermatitis 11/09/2014  . Dysmetabolic syndrome 32/95/1884  . Hypertension, benign 11/09/2014  . Genital herpes in women 11/09/2014  . Mild intermittent asthma 11/09/2014  . Allergic rhinitis,  seasonal 11/09/2014  . Class 2 severe obesity with serious comorbidity and body mass index (BMI) of 38.0 to 38.9 in adult ([place]) 11/09/2014  . IBS (irritable bowel syndrome) 11/09/2014  . Bilateral polycystic ovarian syndrome 11/09/2014  . Abnormal presence of protein in urine 11/09/2014  . Vitamin D deficiency 11/09/2014  . Anxiety 11/09/2014    History reviewed. No pertinent surgical history.  Family History  Problem Relation Age of Onset  . Hypertension Mother   . Obesity Mother   . Cancer Father   . Diabetes Father   . Kidney disease Father   . Obesity Maternal Grandmother     Social History   Socioeconomic History  . Marital status: Single    Spouse name: Not on file  . Number of children: 0  . Years of education: Not on file  . Highest education level: Some college, no degree  Occupational History  . Occupation: gas station attendant.     Comment: [employer]  Social Needs  . Financial resource strain: Somewhat hard  . Food insecurity    Worry: Never true    Inability: Never true  . Transportation needs    Medical: No    Non-medical: No  Tobacco Use  . Smoking status: Never Smoker  . Smokeless tobacco: Never Used  Substance and Sexual Activity  . Alcohol use: Yes    Alcohol/week: 0.0 standard drinks    Comment: seldom  . Drug use: No  . Sexual activity: Not Currently    Partners: Male    Birth control/protection: Pill, Abstinence  Lifestyle  . Physical activity    Days per week: 2 days    Minutes per session: 60 min  . Stress: Not at all  Relationships  . Social connections    Talks on phone: More than three times a week    Gets together: More than three times a week    Attends religious service: More than 4 times per year    Active member of club or organization: Yes    Attends meetings of clubs or organizations: More than 4 times per year    Relationship status: Never married  . Intimate partner violence    Fear of current or ex partner: No     Emotionally abused: No    Physically abused: No    Forced sexual activity: No  Other Topics Concern  . Not on file  Social History Narrative   She is going to school at [employer] - graduates Dec  2019 - hospitality and tourism management - she wants to be Biomedical scientist- goal is to work at a cruise ship   She works part time at [employer]  Current Outpatient Medications:  .  albuterol (VENTOLIN HFA) 108 (90 Base) MCG/ACT inhaler, Inhale 2 puffs into the lungs every 6 (six) hours as needed for wheezing or shortness of breath., Disp: 6.7 Inhaler, Rfl: 0 .  APRI 0.15-30 MG-MCG tablet, TAKE 1 TABLET BY MOUTH EVERY DAY, Disp: 84 tablet, Rfl: 2 .  atenolol (TENORMIN) 25 MG tablet, TAKE 1 TABLET BY MOUTH EVERY EVENING FOR BP AND ANXIETY, Disp: 90 tablet, Rfl: 1 .  buPROPion (WELLBUTRIN XL) 150 MG 24 hr tablet, Take 1 tablet (150 mg total) by mouth daily., Disp: 90 tablet, Rfl: 1 .  Cholecalciferol (VITAMIN D) 2000 UNITS CAPS, Take 1 capsule by mouth daily., Disp: , Rfl:  .  desoximetasone (TOPICORT) 0.25 % cream, Apply 1 application topically 2 (two) times daily., Disp: 100 g, Rfl: 0 .  enalapril (VASOTEC) 5 MG tablet, Take 1 tablet by mouth daily., Disp: , Rfl:  .  fluticasone (FLONASE) 50 MCG/ACT nasal spray, Place 2 sprays into both nostrils at bedtime., Disp: 48 g, Rfl: 1 .  ketoconazole (NIZORAL) 2 % cream, Apply 1 application topically at bedtime., Disp: 60 g, Rfl: 0 .  loratadine (CLARITIN) 10 MG tablet, Take 1 tablet (10 mg total) by mouth daily., Disp: 90 tablet, Rfl: 1 .  metFORMIN (GLUCOPHAGE) 500 MG tablet, Take 1 tablet (500 mg total) by mouth daily with breakfast., Disp: 90 tablet, Rfl: 1 .  montelukast (SINGULAIR) 10 MG tablet, Take 1 tablet (10 mg total) by mouth at bedtime., Disp: 90 tablet, Rfl: 1 .  Plecanatide (TRULANCE) 3 MG TABS, Take 1 tablet by mouth daily., Disp: 90 tablet, Rfl: 1 .  polyethylene glycol powder (GLYCOLAX/MIRALAX) 17 GM/SCOOP powder, Take 17 g by mouth 2  (two) times daily as needed., Disp: 3350 g, Rfl: 0 .  triamcinolone cream (KENALOG) 0.1 %, Apply 1 application topically 2 (two) times daily., Disp: 45 g, Rfl: 0 .  valACYclovir (VALTREX) 500 MG tablet, Take 1 tablet (500 mg total) by mouth daily. And twice daily on outbreaks, Disp: 115 tablet, Rfl: 1 .  Vitamin D, Ergocalciferol, (DRISDOL) 1.25 MG (50000 UT) CAPS capsule, Take 1 capsule (50,000 Units total) by mouth every 7 (seven) days., Disp: 12 capsule, Rfl: 0  No Known Allergies   ROS  Constitutional: Negative for fever or weight change.  Respiratory: Negative for cough and shortness of breath.   Cardiovascular: Negative for chest pain or palpitations.  Gastrointestinal: Negative for abdominal pain, no bowel changes.  Musculoskeletal: Negative for gait problem or joint swelling.  Skin: Negative for rash.  Neurological: Negative for dizziness or headache.  No other specific complaints in a complete review of systems (except as listed in HPI above).  Objective  Vitals:   09/22/18 0945  BP: 120/86  Pulse: 91  Resp: 16  Temp: 98.6 F (37 C)  TempSrc: Oral  SpO2: 98%  Weight: 250 lb 9.6 oz (113.7 kg)  Height: 5' 7"  (1.702 m)    Body mass index is 39.25 kg/m.  Physical Exam  Constitutional: Patient appears well-developed and well-nourished.Obesity  No distress.  HENT: Head: Normocephalic and atraumatic. Ears: B TMs ok, no erythema or effusion; Nose: Nose normal. Mouth/Throat: Oropharynx is clear and moist. No oropharyngeal exudate.  Eyes: Conjunctivae and EOM are normal. Pupils are equal, round, and reactive to light. No scleral icterus.  Neck: Normal range of motion. Neck supple. No JVD present. No thyromegaly present.  Cardiovascular: Normal rate, regular rhythm and normal heart sounds.  No murmur heard. No BLE  edema. Pulmonary/Chest: Effort normal and breath sounds normal. No respiratory distress. Abdominal: Soft. Bowel sounds are normal, no distension. There is no  tenderness. no masses Breast: no lumps or masses, no nipple discharge or rashes FEMALE GENITALIA:  Not done RECTAL: not done Musculoskeletal: Normal range of motion, no joint effusions. No gross deformities Neurological: he is alert and oriented to person, place, and time. No cranial nerve deficit. Coordination, balance, strength, speech and gait are normal.  Skin: Skin is warm and dry. No rash noted. No erythema.  Psychiatric: Patient has a normal mood and affect. behavior is normal. Judgment and thought content normal.  PHQ2/9: Depression screen [facility] 2/9 09/22/2018 08/31/2018 03/02/2018 01/26/2018 01/06/2018  Decreased Interest 0 1 0 0 2  Down, Depressed, Hopeless 1 2 0 0 2  PHQ - 2 Score 1 3 0 0 4  Altered sleeping 2 1 0 0 0  Tired, decreased energy 2 2 1 1 2   Change in appetite 1 1 1 1 3   Feeling bad or failure about yourself  0 0 0 0 0  Trouble concentrating 0 0 0 0 1  Moving slowly or fidgety/restless 0 0 0 2 0  Suicidal thoughts 0 0 0 0 0  PHQ-9 Score 6 7 2 4 10   Difficult doing work/chores Not difficult at all Somewhat difficult Not difficult at all - Somewhat difficult  Some recent data might be hidden    Positive, she is upset about not being able to work in her field, also less income secondary to [place]: Fall Risk  09/22/2018 08/31/2018 03/02/2018 11/30/2017 10/19/2017  Falls in the past year? 0 0 0 No No  Number falls in past yr: 0 0 0 - -  Injury with Fall? 0 0 - - -    Assessment & Plan  1. Well woman exam  - Lipid panel - COMPLETE METABOLIC PANEL WITH GFR - CBC with Differential/Platelet - Insulin, Free (Bioactive) - Hepatitis C Antibody  2. Cervical cancer screening  She has not been sexually active in over 3 years and we will change to checking every 5 years for now  3. Routine screening for STI (sexually transmitted infection)  GC  4. Need for hepatitis C screening test  - Hepatitis C Antibody  5. Screening for deficiency anemia  -  CBC with Differential/Platelet  6. Insulin resistance  - Insulin, Free (Bioactive)  7. Lipid screening  - Lipid panel  -USPSTF grade A and B recommendations reviewed with patient; age-appropriate recommendations, preventive care, screening tests, etc discussed and encouraged; healthy living encouraged; see AVS for patient education given to patient -Discussed importance of 150 minutes of physical activity weekly, eat two servings of fish weekly, eat one serving of tree nuts ( cashews, pistachios, pecans, almonds.Marland Kitchen) every other day, eat 6 servings of fruit/vegetables daily and drink plenty of water and avoid sweet beverages.

## 2018-09-23 LAB — URINE CYTOLOGY ANCILLARY ONLY
Chlamydia: NEGATIVE
Neisseria Gonorrhea: NEGATIVE

## 2018-09-28 LAB — COMPLETE METABOLIC PANEL WITH GFR
AG Ratio: 1.2 (calc) (ref 1.0–2.5)
ALT: 11 U/L (ref 6–29)
AST: 17 U/L (ref 10–30)
Albumin: 3.8 g/dL (ref 3.6–5.1)
Alkaline phosphatase (APISO): 43 U/L (ref 31–125)
BUN: 11 mg/dL (ref 7–25)
CO2: 21 mmol/L (ref 20–32)
Calcium: 9 mg/dL (ref 8.6–10.2)
Chloride: 106 mmol/L (ref 98–110)
Creat: 0.88 mg/dL (ref 0.50–1.10)
GFR, Est African American: 107 mL/min/{1.73_m2} (ref >=60)
GFR, Est Non African American: 92 mL/min/{1.73_m2} (ref >=60)
Globulin: 3.3 g/dL (calc) (ref 1.9–3.7)
Glucose, Bld: 78 mg/dL (ref 65–99)
Potassium: 4.3 mmol/L (ref 3.5–5.3)
Sodium: 133 mmol/L — ABNORMAL LOW (ref 135–146)
Total Bilirubin: 0.3 mg/dL (ref 0.2–1.2)
Total Protein: 7.1 g/dL (ref 6.1–8.1)

## 2018-09-28 LAB — LIPID PANEL
Cholesterol: 162 mg/dL (ref <200)
HDL: 57 mg/dL (ref >=50)
LDL Cholesterol (Calc): 84 mg/dL (calc)
Non-HDL Cholesterol (Calc): 105 mg/dL (calc) (ref <130)
Total CHOL/HDL Ratio: 2.8 (calc) (ref <5.0)
Triglycerides: 109 mg/dL (ref <150)

## 2018-09-28 LAB — CBC WITH DIFFERENTIAL/PLATELET
Absolute Monocytes: 686 cells/uL (ref 200–950)
Basophils Absolute: 39 cells/uL (ref 0–200)
Basophils Relative: 0.4 %
Eosinophils Absolute: 157 cells/uL (ref 15–500)
Eosinophils Relative: 1.6 %
HCT: 35.6 % (ref 35.0–45.0)
Hemoglobin: 12 g/dL (ref 11.7–15.5)
Lymphs Abs: 3077 cells/uL (ref 850–3900)
MCH: 27.6 pg (ref 27.0–33.0)
MCHC: 33.7 g/dL (ref 32.0–36.0)
MCV: 81.8 fL (ref 80.0–100.0)
MPV: 11.6 fL (ref 7.5–12.5)
Monocytes Relative: 7 %
Neutro Abs: 5841 cells/uL (ref 1500–7800)
Neutrophils Relative %: 59.6 %
Platelets: 241 10*3/uL (ref 140–400)
RBC: 4.35 10*6/uL (ref 3.80–5.10)
RDW: 12.9 % (ref 11.0–15.0)
Total Lymphocyte: 31.4 %
WBC: 9.8 10*3/uL (ref 3.8–10.8)

## 2018-09-28 LAB — HEPATITIS C ANTIBODY
Hepatitis C Ab: NONREACTIVE
SIGNAL TO CUT-OFF: 0.01 (ref <1.00)

## 2018-09-28 LAB — INSULIN, FREE (BIOACTIVE): Insulin, Free: 11.1 u[IU]/mL (ref 1.5–14.9)

## 2018-10-26 DIAGNOSIS — R809 Proteinuria, unspecified: Secondary | ICD-10-CM | POA: Diagnosis not present

## 2018-10-26 DIAGNOSIS — I10 Essential (primary) hypertension: Secondary | ICD-10-CM | POA: Diagnosis not present

## 2018-10-27 DIAGNOSIS — I10 Essential (primary) hypertension: Secondary | ICD-10-CM | POA: Diagnosis not present

## 2018-10-27 DIAGNOSIS — R809 Proteinuria, unspecified: Secondary | ICD-10-CM | POA: Diagnosis not present

## 2019-01-18 ENCOUNTER — Other Ambulatory Visit: Payer: Self-pay

## 2019-01-18 ENCOUNTER — Ambulatory Visit (INDEPENDENT_AMBULATORY_CARE_PROVIDER_SITE_OTHER): Payer: Federal, State, Local not specified - PPO

## 2019-01-18 DIAGNOSIS — Z23 Encounter for immunization: Secondary | ICD-10-CM | POA: Diagnosis not present

## 2019-02-10 ENCOUNTER — Other Ambulatory Visit: Payer: Self-pay | Admitting: Family Medicine

## 2019-03-06 ENCOUNTER — Other Ambulatory Visit: Payer: Self-pay

## 2019-03-06 ENCOUNTER — Ambulatory Visit: Payer: Federal, State, Local not specified - PPO | Admitting: Family Medicine

## 2019-03-06 ENCOUNTER — Encounter: Payer: Self-pay | Admitting: Family Medicine

## 2019-03-06 DIAGNOSIS — F331 Major depressive disorder, recurrent, moderate: Secondary | ICD-10-CM

## 2019-03-06 DIAGNOSIS — E282 Polycystic ovarian syndrome: Secondary | ICD-10-CM | POA: Diagnosis not present

## 2019-03-06 DIAGNOSIS — I10 Essential (primary) hypertension: Secondary | ICD-10-CM

## 2019-03-06 DIAGNOSIS — K5909 Other constipation: Secondary | ICD-10-CM

## 2019-03-06 DIAGNOSIS — E8881 Metabolic syndrome: Secondary | ICD-10-CM

## 2019-03-06 DIAGNOSIS — J302 Other seasonal allergic rhinitis: Secondary | ICD-10-CM

## 2019-03-06 DIAGNOSIS — B356 Tinea cruris: Secondary | ICD-10-CM

## 2019-03-06 DIAGNOSIS — A6004 Herpesviral vulvovaginitis: Secondary | ICD-10-CM

## 2019-03-06 DIAGNOSIS — E559 Vitamin D deficiency, unspecified: Secondary | ICD-10-CM

## 2019-03-06 DIAGNOSIS — J3089 Other allergic rhinitis: Secondary | ICD-10-CM

## 2019-03-06 DIAGNOSIS — J4520 Mild intermittent asthma, uncomplicated: Secondary | ICD-10-CM

## 2019-03-06 MED ORDER — KETOCONAZOLE 2 % EX CREA: 1.0000 "application " | TOPICAL_CREAM | Freq: Every day | CUTANEOUS | 0 refills | Status: DC

## 2019-03-06 MED ORDER — VITAMIN D (ERGOCALCIFEROL) 1.25 MG (50000 UNIT) PO CAPS: 50000.0000 [IU] | ORAL_CAPSULE | ORAL | 1 refills | Status: DC

## 2019-03-06 MED ORDER — POLYETHYLENE GLYCOL 3350 17 GM/SCOOP PO POWD: 17.0000 g | Freq: Two times a day (BID) | ORAL | 1 refills | Status: DC | PRN

## 2019-03-06 MED ORDER — VALACYCLOVIR HCL 500 MG PO TABS: 500.0000 mg | ORAL_TABLET | Freq: Every day | ORAL | 1 refills | Status: DC

## 2019-03-06 MED ORDER — METFORMIN HCL 500 MG PO TABS: 500.0000 mg | ORAL_TABLET | Freq: Every day | ORAL | 1 refills | Status: DC

## 2019-03-06 MED ORDER — MONTELUKAST SODIUM 10 MG PO TABS: 10.0000 mg | ORAL_TABLET | Freq: Every day | ORAL | 1 refills | Status: DC

## 2019-03-06 MED ORDER — ATENOLOL 25 MG PO TABS: ORAL_TABLET | ORAL | 1 refills | Status: DC

## 2019-03-06 MED ORDER — LORATADINE 10 MG PO TABS: 10.0000 mg | ORAL_TABLET | Freq: Every day | ORAL | 1 refills | Status: DC

## 2019-03-06 MED ORDER — BUPROPION HCL ER (XL) 150 MG PO TB24: 150.0000 mg | ORAL_TABLET | Freq: Every day | ORAL | 1 refills | Status: DC

## 2019-03-06 NOTE — Progress Notes (Signed)
Name: Rachel Dunlap   MRN: [id number]    DOB: October 13, 1995   Date:03/06/2019       Progress Note  Subjective  Chief Complaint  Chief Complaint  Patient presents with  . Medication Refill  . Obesity  . Depression/Major grieving  . Hypertension    Denies any symptoms   . Allergic Rhinitis     Well controlled with medication  . Asthma    worst with physical activity  . Constipation    HPI  Obesity:gyms are closed and has not been exercising except walking at work. She could not afford Belviq or Ozempic. She is going to healthy weight and wellness center but got too expensive. She states doing intermittent fasting , trying to cut down on carbs, and weight has been stable since last visit. She has busier - more active at work   Depression Major/grieving: shenever properly grieved the loss of her father that died 01-09-17, followed by losing  her half - brother 06/2019so felt much worse. Taking Wellbutrin XL 150mg  and she was seeing counselor however she stopped since COVID-19, but is journaling and talking to a close friend and her mother about her feelings. She was thinking about stopping medication, but advised to wait until Spring. She states worries about rejection because she has a history of herpes   HTN: bp is okay,she denies sob or chest pain. Also sees Dr. 08-02-1968 and is taking vasotec because of microalbuminuria. She is aware of teratogenic effects of ACE She is also on Atenolol  She has been checking bp at home   Pre-diabetes: she denies polyphagia, polydipsia or polyuria. Fasting insulin elevated, discussed dietary modifications  AR: doing well at this time, taking medications, no rhinorrhea or congestion. Taking singulair and loratadine   Asthma: she is taking singulair daily and has not needed to use rescue inhaler, denies wheezing, cough or SOB. Last flare was over one year ago   Genital herpes: she takes valtrex daily, she states last outbreak  was years ago when she was first diagnosed.   Constipation: long history of constipation, currently using Miralax and has been working well as long as she takes it daily . No straining or blood in stools. She has been drinking 4 bottles of water daily and it has helped.    Patient Active Problem List   Diagnosis Date Noted  . Eczema 08/31/2018  . Insulin resistance 05/16/2018  . Depression 05/16/2018  . Prediabetes 02/14/2018  . Essential hypertension 02/14/2018  . Class 2 severe obesity with serious comorbidity and body mass index (BMI) of 39.0 to 39.9 in adult (H[facility]) 02/14/2018  . Moderate recurrent major depression ([facility]) 02/16/2017  . Back pain due to injury 09/18/2016  . Acne vulgaris 09/16/2015  . Hypertrichosis 09/16/2015  . Chronic constipation 11/09/2014  . Chronic dermatitis 11/09/2014  . Dysmetabolic syndrome 11/09/2014  . Hypertension, benign 11/09/2014  . Genital herpes in women 11/09/2014  . Mild intermittent asthma 11/09/2014  . Allergic rhinitis, seasonal 11/09/2014  . Class 2 severe obesity with serious comorbidity and body mass index (BMI) of 38.0 to 38.9 in adult ([facility]) 11/09/2014  . IBS (irritable bowel syndrome) 11/09/2014  . Bilateral polycystic ovarian syndrome 11/09/2014  . Abnormal presence of protein in urine 11/09/2014  . Vitamin D deficiency 11/09/2014  . Anxiety 11/09/2014    History reviewed. No pertinent surgical history.  Family History  Problem Relation Age of Onset  . Hypertension Mother   . Obesity Mother   . Cancer  Father   . Diabetes Father   . Kidney disease Father   . Obesity Maternal Grandmother     Social History   Socioeconomic History  . Marital status: Single    Spouse name: Not on file  . Number of children: 0  . Years of education: Not on file  . Highest education level: Some college, no degree  Occupational History  . Occupation: gas station attendant.     Comment: [employer]  Social Needs  . Financial resource strain:  Somewhat hard  . Food insecurity    Worry: Never true    Inability: Never true  . Transportation needs    Medical: No    Non-medical: No  Tobacco Use  . Smoking status: Never Smoker  . Smokeless tobacco: Never Used  Substance and Sexual Activity  . Alcohol use: Yes    Alcohol/week: 0.0 standard drinks    Comment: seldom  . Drug use: No  . Sexual activity: Not Currently    Partners: Male    Birth control/protection: Pill, Abstinence  Lifestyle  . Physical activity    Days per week: 2 days    Minutes per session: 60 min  . Stress: Not at all  Relationships  . Social connections    Talks on phone: More than three times a week    Gets together: More than three times a week    Attends religious service: More than 4 times per year    Active member of club or organization: Yes    Attends meetings of clubs or organizations: More than 4 times per year    Relationship status: Never married  . Intimate partner violence    Fear of current or ex partner: No    Emotionally abused: No    Physically abused: No    Forced sexual activity: No  Other Topics Concern  . 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  albuterol (VENTOLIN HFA) 108 (90 Base) MCG/ACT inhaler, Inhale 2 puffs into the lungs every 6 (six) hours as needed for wheezing or shortness of breath., Disp: 6.7 Inhaler, Rfl: 0 .  APRI 0.15-30 MG-MCG tablet, TAKE 1 TABLET BY MOUTH EVERY DAY, Disp: 84 tablet, Rfl: 2 .  atenolol (TENORMIN) 25 MG tablet, TAKE 1 TABLET BY MOUTH EVERY EVENING FOR BP AND ANXIETY, Disp: 90 tablet, Rfl: 1 .  buPROPion (WELLBUTRIN XL) 150 MG 24 hr tablet, Take 1 tablet (150 mg total) by mouth daily., Disp: 90 tablet, Rfl: 1 .  desoximetasone (TOPICORT) 0.25 % cream, Apply 1  application topically 2 (two) times daily., Disp: 100 g, Rfl: 0 .  enalapril (VASOTEC) 5 MG tablet, Take 1 tablet by mouth daily., Disp: , Rfl:  .  fluticasone (FLONASE) 50 MCG/ACT nasal spray, Place 2 sprays into both nostrils at bedtime., Disp: 48 g, Rfl: 1 .  ketoconazole (NIZORAL) 2 % cream, Apply 1 application topically at bedtime., Disp: 60 g, Rfl: 0 .  loratadine (CLARITIN) 10 MG tablet, Take 1 tablet (10 mg total) by mouth daily., Disp: 90 tablet, Rfl: 1 .  metFORMIN (GLUCOPHAGE) 500 MG tablet, Take 1 tablet (500 mg total) by mouth daily with breakfast., Disp: 90 tablet, Rfl: 1 .  montelukast (SINGULAIR) 10 MG tablet, Take 1 tablet (10 mg total) by mouth at bedtime., Disp: 90 tablet, Rfl: 1 .  polyethylene glycol powder (GLYCOLAX/MIRALAX) 17 GM/SCOOP powder, Take 17 g by mouth 2 (two) times daily as needed., Disp: 3350 g, Rfl: 1 .  triamcinolone cream (KENALOG) 0.1 %, Apply 1 application topically 2 (two) times daily., Disp: 45 g, Rfl: 0 .  valACYclovir (VALTREX) 500 MG tablet, Take 1 tablet (500 mg total) by mouth daily. And twice daily on outbreaks, Disp: 115 tablet, Rfl: 1 .  Vitamin D, Ergocalciferol, (DRISDOL) 1.25 MG (50000 UT) CAPS capsule, Take 1 capsule (50,000 Units total) by mouth every 7 (seven) days., Disp: 12 capsule, Rfl: 1  No Known Allergies  I personally reviewed active problem list, medication list, allergies, family history, social history, health maintenance with the patient/caregiver today.   ROS  Constitutional: Negative for fever or weight change.  Respiratory: Negative for cough and shortness of breath.   Cardiovascular: Negative for chest pain or palpitations.  Gastrointestinal: Negative for abdominal pain, no bowel changes.  Musculoskeletal: Negative for gait problem or joint swelling.  Skin: Negative for rash.  Neurological: Negative for dizziness or headache.  No other specific complaints in a complete review of systems (except as listed in HPI  above).  Objective  Vitals:   03/06/19 1020  BP: 132/82  Pulse: 96  Resp: 16  Temp: (!) 96.8 F (36 C)  TempSrc: Temporal  SpO2: 98%  Weight: 252 lb 1.6 oz (114.4 kg)  Height:  (1.702 m)    Body mass index is 39.48 kg/m.  Physical Exam  Constitutional: Patient appears well-developed and well-nourished. Obese  No distress.  HEENT: head atraumatic, normocephalic, pupils equal and reactive to light Cardiovascular: Normal rate, regular rhythm and normal heart sounds.  No murmur heard. No BLE edema. Pulmonary/Chest: Effort normal and breath sounds normal. No respiratory distress. Abdominal: Soft.  There is no tenderness. Psychiatric: Patient has a normal mood and affect. behavior is normal. Judgment and thought content normal.  PHQ2/9: Depression screen [facility] 2/9 03/06/2019 09/22/2018 08/31/2018 03/02/2018 01/26/2018  Decreased Interest 1 0 1 0 0  Down, Depressed, Hopeless 0 0  PHQ - 2 Score 0 0  Altered sleeping 0 2 1 0 0  Tired, decreased energy Change in appetite 0 Feeling bad or failure about yourself  0 0 0 0 0  Trouble concentrating 0 0 0 0 0  Moving slowly or fidgety/restless 0 0 0 0 2  Suicidal thoughts 0 0 0 0 0  PHQ-9 Score Difficult doing work/chores Not difficult at all Not difficult at all Somewhat difficult Not difficult at all -  Some recent data might be hidden    Positive test   Fall Risk: Fall Risk  03/06/2019 09/22/2018 08/31/2018 03/02/2018 11/30/2017  Falls in the past year? 0 0 0 0 No  Number falls in past yr: 0 0 0 0 -  Injury with Fall? 0 0 0 - -     Functional Status Survey: Is the patient deaf or have difficulty hearing?: No Does the patient have difficulty seeing, even when wearing glasses/contacts?: No Does the patient have difficulty concentrating, remembering, or making decisions?: No Does the patient have difficulty walking or climbing stairs?: No Does the patient have difficulty dressing or  bathing?: No Does the patient have difficulty doing errands alone such  as visiting a doctor's office or shopping?: No    Assessment & Plan  1. Moderate recurrent major depression (HCC)  - buPROPion (WELLBUTRIN XL) 150 MG 24 hr tablet; Take 1 tablet (150 mg total) by mouth daily.  Dispense: 90 tablet; Refill: 1  2. Hypertension, benign  - atenolol (TENORMIN) 25 MG tablet; TAKE 1 TABLET BY MOUTH EVERY EVENING FOR BP AND ANXIETY  Dispense: 90 tablet; Refill: 1  3. Metabolic syndrome  - metFORMIN (GLUCOPHAGE) 500 MG tablet; Take 1 tablet (500 mg total) by mouth daily with breakfast.  Dispense: 90 tablet; Refill: 1  4. Bilateral polycystic ovarian syndrome  - metFORMIN (GLUCOPHAGE) 500 MG tablet; Take 1 tablet (500 mg total) by mouth daily with breakfast.  Dispense: 90 tablet; Refill: 1  5. Vitamin D deficiency  - Vitamin D, Ergocalciferol, (DRISDOL) 1.25 MG (50000 UT) CAPS capsule; Take 1 capsule (50,000 Units total) by mouth every 7 (seven) days.  Dispense: 12 capsule; Refill: 1  6. Tinea cruris  - ketoconazole (NIZORAL) 2 % cream; Apply 1 application topically at bedtime.  Dispense: 60 g; Refill: 0  7. Perennial allergic rhinitis with seasonal variation  - loratadine (CLARITIN) 10 MG tablet; Take 1 tablet (10 mg total) by mouth daily.  Dispense: 90 tablet; Refill: 1  8. Mild intermittent asthma, uncomplicated  - montelukast (SINGULAIR) 10 MG tablet; Take 1 tablet (10 mg total) by mouth at bedtime.  Dispense: 90 tablet; Refill: 1  9. Chronic constipation  - polyethylene glycol powder (GLYCOLAX/MIRALAX) 17 GM/SCOOP powder; Take 17 g by mouth 2 (two) times daily as needed.  Dispense: 3350 g; Refill: 1  10. Herpes simplex vulvovaginitis  - valACYclovir (VALTREX) 500 MG tablet; Take 1 tablet (500 mg total) by mouth daily. And twice daily on outbreaks  Dispense: 115 tablet; Refill: 1

## 2019-03-23 ENCOUNTER — Encounter: Payer: Self-pay | Admitting: Family Medicine

## 2019-03-24 ENCOUNTER — Encounter: Payer: Self-pay | Admitting: Family Medicine

## 2019-03-27 ENCOUNTER — Ambulatory Visit: Payer: Federal, State, Local not specified - PPO | Admitting: Family Medicine

## 2019-03-27 ENCOUNTER — Other Ambulatory Visit: Payer: Self-pay

## 2019-03-27 ENCOUNTER — Encounter: Payer: Self-pay | Admitting: Family Medicine

## 2019-03-27 VITALS — BP 130/70 | HR 95 | Temp 97.1°F | Resp 16 | Ht 67.0 in | Wt 256.3 lb

## 2019-03-27 DIAGNOSIS — L02229 Furuncle of trunk, unspecified: Secondary | ICD-10-CM | POA: Diagnosis not present

## 2019-03-27 DIAGNOSIS — L02429 Furuncle of limb, unspecified: Secondary | ICD-10-CM | POA: Diagnosis not present

## 2019-03-27 DIAGNOSIS — F330 Major depressive disorder, recurrent, mild: Secondary | ICD-10-CM | POA: Diagnosis not present

## 2019-03-27 NOTE — Progress Notes (Signed)
Name: Rachel Dunlap   MRN: [id number]    DOB: 16-Jun-1995   Date:03/27/2019       Progress Note  Subjective  Chief Complaint  Chief Complaint  Patient presents with  . Vaginal Pain    She has a painful sore on her vagina x 1.5 weeks and also has one near her groin. She is not sure if it is a boil or genitial herpes outbreak.     HPI  Skin lesion: she had two lesion , one on right inner thigh and also a larger area about two weeks ago on left lower abdominal wall. She states the thigh is resolving by itself and the one on abdominal wall popped and drained pus last week. She states pain has improved since, no fever, chills and area is just dark now. She has a history of herpes and was really worried about an outbreak. Spent time answering questions and discussed symptoms of herpes with her today. She is due for a CPE and pap smear but is currently on her cycle, we will due pap in June as scheduled.  Depression: she is frustrated about weight gain, but cannot stop eating . She is going back to therapy, also discussed [facility] program for weight loss.   Patient Active Problem List   Diagnosis Date Noted  . Eczema 08/31/2018  . Insulin resistance 05/16/2018  . Depression 05/16/2018  . Prediabetes 02/14/2018  . Essential hypertension 02/14/2018  . Class 2 severe obesity with serious comorbidity and body mass index (BMI) of 39.0 to 39.9 in adult (HCC) 02/14/2018  . Moderate recurrent major depression (HCC) 02/16/2017  . Back pain due to injury 09/18/2016  . Acne vulgaris 09/16/2015  . Hypertrichosis 09/16/2015  . Chronic constipation 11/09/2014  . Chronic dermatitis 11/09/2014  . Dysmetabolic syndrome 11/09/2014  . Hypertension, benign 11/09/2014  . Genital herpes in women 11/09/2014  . Mild intermittent asthma 11/09/2014  . Allergic rhinitis, seasonal 11/09/2014  . Class 2 severe obesity with serious comorbidity and body mass index (BMI) of 38.0 to 38.9 in adult (HCC) 11/09/2014  . IBS  (irritable bowel syndrome) 11/09/2014  . Bilateral polycystic ovarian syndrome 11/09/2014  . Abnormal presence of protein in urine 11/09/2014  . Vitamin D deficiency 11/09/2014  . Anxiety 11/09/2014    History reviewed. No pertinent surgical history.  Family History  Problem Relation Age of Onset  . Hypertension Mother   . Obesity Mother   . Cancer Father   . Diabetes Father   . Kidney disease Father   . Obesity Maternal Grandmother     Social History   Socioeconomic History  . Marital status: Single    Spouse name: Not on file  . Number of children: 0  . Years of education: Not on file  . Highest education level: Some college, no degree  Occupational History  . Occupation: gas station attendant.     Comment: [employer]  Tobacco Use  . Smoking status: Never Smoker  . Smokeless tobacco: Never Used  Substance and Sexual Activity  . Alcohol use: Yes    Alcohol/week: 0.0 standard drinks    Comment: seldom  . Drug use: No  . Sexual activity: Not Currently    Partners: Male    Birth control/protection: Pill, Abstinence  Other Topics Concern  . Not on file  Social History Narrative   She is going to school at [employer] - graduates Dec  2019 - hospitality and tourism management - she wants to be Investment banker, operationalchef- goal  is to work at a cruise ship   She works part time at [employer]:   . Difficulty of Paying Living Expenses: Not on file  Food Insecurity:   . Worried About Charity fundraiser in the Last Year: Not on file  . Ran Out of Food in the Last Year: Not on file  Transportation Needs:   . Lack of Transportation (Medical): Not on file  . Lack of Transportation (Non-Medical): Not on file  Physical Activity:   . Days of Exercise per Week: Not on file  . Minutes of Exercise per Session: Not on file  Stress:   . Feeling of Stress : Not on file  Social Connections:   . Frequency of Communication with Friends and  Family: Not on file  . Frequency of Social Gatherings with Friends and Family: Not on file  . Attends Religious Services: Not on file  . Active Member of Clubs or Organizations: Not on file  . Attends Archivist Meetings: Not on file  . Marital Status: Not on file  Intimate Partner Violence:   . Fear of Current or Ex-Partner: Not on file  . Emotionally Abused: Not on file  . Physically Abused: Not on file  . Sexually Abused: Not on file     Current Outpatient Medications:  .  albuterol (VENTOLIN HFA) 108 (90 Base) MCG/ACT inhaler, Inhale 2 puffs into the lungs every 6 (six) hours as needed for wheezing or shortness of breath., Disp: 6.7 Inhaler, Rfl: 0 .  APRI 0.15-30 MG-MCG tablet, TAKE 1 TABLET BY MOUTH EVERY DAY, Disp: 84 tablet, Rfl: 2 .  atenolol (TENORMIN) 25 MG tablet, TAKE 1 TABLET BY MOUTH EVERY EVENING FOR BP AND ANXIETY, Disp: 90 tablet, Rfl: 1 .  buPROPion (WELLBUTRIN XL) 150 MG 24 hr tablet, Take 1 tablet (150 mg total) by mouth daily., Disp: 90 tablet, Rfl: 1 .  desoximetasone (TOPICORT) 0.25 % cream, Apply 1 application topically 2 (two) times daily., Disp: 100 g, Rfl: 0 .  enalapril (VASOTEC) 5 MG tablet, Take 1 tablet by mouth daily., Disp: , Rfl:  .  fluticasone (FLONASE) 50 MCG/ACT nasal spray, Place 2 sprays into both nostrils at bedtime., Disp: 48 g, Rfl: 1 .  ketoconazole (NIZORAL) 2 % cream, Apply 1 application topically at bedtime., Disp: 60 g, Rfl: 0 .  loratadine (CLARITIN) 10 MG tablet, Take 1 tablet (10 mg total) by mouth daily., Disp: 90 tablet, Rfl: 1 .  metFORMIN (GLUCOPHAGE) 500 MG tablet, Take 1 tablet (500 mg total) by mouth daily with breakfast., Disp: 90 tablet, Rfl: 1 .  montelukast (SINGULAIR) 10 MG tablet, Take 1 tablet (10 mg total) by mouth at bedtime., Disp: 90 tablet, Rfl: 1 .  polyethylene glycol powder (GLYCOLAX/MIRALAX) 17 GM/SCOOP powder, Take 17 g by mouth 2 (two) times daily as needed., Disp: 3350 g, Rfl: 1 .  triamcinolone cream  (KENALOG) 0.1 %, Apply 1 application topically 2 (two) times daily., Disp: 45 g, Rfl: 0 .  valACYclovir (VALTREX) 500 MG tablet, Take 1 tablet (500 mg total) by mouth daily. And twice daily on outbreaks, Disp: 115 tablet, Rfl: 1 .  Vitamin D, Ergocalciferol, (DRISDOL) 1.25 MG (50000 UT) CAPS capsule, Take 1 capsule (50,000 Units total) by mouth every 7 (seven) days., Disp: 12 capsule, Rfl: 1  No Known Allergies  I personally reviewed active problem list, medication list, allergies, family history, social history with the patient/caregiver  today.   ROS  Constitutional: Negative for fever or weight change.  Respiratory: Negative for cough and shortness of breath.   Cardiovascular: Negative for chest pain or palpitations.  Gastrointestinal: Negative for abdominal pain, no bowel changes.  Musculoskeletal: Negative for gait problem or joint swelling.  Skin: Negative for rash.  Neurological: Negative for dizziness or headache.  No other specific complaints in a complete review of systems (except as listed in HPI above).  Objective  Vitals:   03/27/19 1433  BP: 130/70  Pulse: 95  Resp: 16  Temp: (!) 97.1 F (36.2 C)  TempSrc: Temporal  SpO2: 98%  Weight: 256 lb 4.8 oz (116.3 kg)  Height: 5\' 7"  (1.702 m)    Body mass index is 40.14 kg/m.  Physical Exam  Constitutional: Patient appears well-developed and well-nourished. Obese No distress.  HEENT: head atraumatic, normocephalic, pupils equal and reactive to light Cardiovascular: Normal rate, regular rhythm and normal heart sounds.  No murmur heard. No BLE edema. Pulmonary/Chest: Effort normal and breath sounds normal. No respiratory distress. Abdominal: Soft.  There is no tenderness. Skin: resolving boils on inner thigh and left lower quadrant, healed, no drainage or pain during palpation  Psychiatric: Patient has a normal mood and affect. behavior is normal. Judgment and thought content normal.  PHQ2/9: Depression screen [facility]  2/9 03/27/2019 03/06/2019 09/22/2018 08/31/2018 03/02/2018  Decreased Interest 0 1 0 1 0  Down, Depressed, Hopeless 1 1 1 2  0  PHQ - 2 Score 1 2 1 3  0  Altered sleeping 0 0 2 1 0  Tired, decreased energy 1 1 2 2 1   Change in appetite 1 0 1 1 1   Feeling bad or failure about yourself  0 0 0 0 0  Trouble concentrating 0 0 0 0 0  Moving slowly or fidgety/restless 0 0 0 0 0  Suicidal thoughts 0 0 0 0 0  PHQ-9 Score 3 3 6 7 2   Difficult doing work/chores Not difficult at all Not difficult at all Not difficult at all Somewhat difficult Not difficult at all  Some recent data might be hidden    phq 9 is positive   Fall Risk: Fall Risk  03/27/2019 03/06/2019 09/22/2018 08/31/2018 03/02/2018  Falls in the past year? 0 0 0 0 0  Number falls in past yr: 0 0 0 0 0  Injury with Fall? 0 0 0 0 -     Functional Status Survey: Is the patient deaf or have difficulty hearing?: No Does the patient have difficulty seeing, even when wearing glasses/contacts?: No Does the patient have difficulty concentrating, remembering, or making decisions?: No Does the patient have difficulty walking or climbing stairs?: No Does the patient have difficulty dressing or bathing?: No Does the patient have difficulty doing errands alone such as visiting a doctor's office or shopping?: No   Assessment & Plan  1. Boil of trunk  Discussed bathing once a week in a full bath with 1/4 cup of bleach   2. Boil, thigh  We will hold off on MRSA swab unless if is recurrent   3. Mild recurrent major depression (HCC)  She will contact therapist

## 2019-06-08 DIAGNOSIS — Z23 Encounter for immunization: Secondary | ICD-10-CM | POA: Diagnosis not present

## 2019-07-18 ENCOUNTER — Encounter: Payer: Self-pay | Admitting: Family Medicine

## 2019-07-18 DIAGNOSIS — A6004 Herpesviral vulvovaginitis: Secondary | ICD-10-CM

## 2019-07-19 MED ORDER — VALACYCLOVIR HCL 500 MG PO TABS: 500.0000 mg | ORAL_TABLET | Freq: Every day | ORAL | 1 refills | Status: DC

## 2019-09-19 ENCOUNTER — Other Ambulatory Visit: Payer: Self-pay | Admitting: Family Medicine

## 2019-09-19 DIAGNOSIS — J4520 Mild intermittent asthma, uncomplicated: Secondary | ICD-10-CM

## 2019-09-19 DIAGNOSIS — J302 Other seasonal allergic rhinitis: Secondary | ICD-10-CM

## 2019-09-19 NOTE — Telephone Encounter (Signed)
Requested Prescriptions  Pending Prescriptions Disp Refills  . montelukast (SINGULAIR) 10 MG tablet [Pharmacy Med Name: MONTELUKAST SOD 10 MG TABLET] 90 tablet 2    Sig: TAKE 1 TABLET BY MOUTH EVERYDAY AT BEDTIME     Pulmonology:  Leukotriene Inhibitors Passed - 09/19/2019  1:33 AM      Passed - Valid encounter within last 12 months    Recent Outpatient Visits          5 months ago Boil of trunk   [facility] [place], Danna Hefty, MD   6 months ago Moderate recurrent major depression [facility])   [facility]ba Cory, MD   12 months ago Well woman exam   [facility] Alba Cory, MD   1 year ago Hypertension, benign   [facility] Cory, MD   1 year ago Moderate recurrent major depression [facility])   [facility] Alba Cory, MD      Future Appointments            In 6 days Alba Cory, MD [facility]           . loratadine (CLARITIN) 10 MG tablet [Pharmacy Med Name: LORATADINE 10 MG TABLET] 90 tablet 1    Sig: TAKE 1 TABLET BY MOUTH EVERY DAY     Ear, Nose, and Throat:  Antihistamines Passed - 09/19/2019  1:33 AM      Passed - Valid encounter within last 12 months    Recent Outpatient Visits          5 months ago Boil of trunk   [facility] [place], Danna Hefty, MD   6 months ago Moderate recurrent major depression [facility])   [facility] Alba Cory, MD   12 months ago Well woman exam   [facility] Alba Cory, MD   1 year ago Hypertension, benign   [facility] Alba Cory, MD   1 year ago Moderate recurrent major depression [facility])   [facility] Alba Cory, MD      Future Appointments            In 6 days Alba Cory, MD [facility]

## 2019-09-21 ENCOUNTER — Other Ambulatory Visit: Payer: Self-pay | Admitting: Family Medicine

## 2019-09-21 DIAGNOSIS — I10 Essential (primary) hypertension: Secondary | ICD-10-CM

## 2019-09-25 ENCOUNTER — Other Ambulatory Visit (HOSPITAL_COMMUNITY)
Admission: RE | Admit: 2019-09-25 | Discharge: 2019-09-25 | Disposition: A | Discharge status: Home / Self Care | Payer: Federal, State, Local not specified - PPO | Source: Ambulatory Visit | Attending: Family Medicine | Admitting: Family Medicine

## 2019-09-25 ENCOUNTER — Encounter: Payer: Self-pay | Admitting: Family Medicine

## 2019-09-25 ENCOUNTER — Other Ambulatory Visit: Payer: Self-pay

## 2019-09-25 ENCOUNTER — Ambulatory Visit: Payer: Federal, State, Local not specified - PPO | Admitting: Family Medicine

## 2019-09-25 VITALS — BP 120/74 | HR 80 | Temp 98.1°F | Resp 18 | Ht 67.0 in | Wt 255.6 lb

## 2019-09-25 DIAGNOSIS — Z0000 Encounter for general adult medical examination without abnormal findings: Secondary | ICD-10-CM

## 2019-09-25 DIAGNOSIS — Z124 Encounter for screening for malignant neoplasm of cervix: Secondary | ICD-10-CM | POA: Diagnosis not present

## 2019-09-25 DIAGNOSIS — Z79899 Other long term (current) drug therapy: Secondary | ICD-10-CM

## 2019-09-25 DIAGNOSIS — Z3041 Encounter for surveillance of contraceptive pills: Secondary | ICD-10-CM

## 2019-09-25 DIAGNOSIS — E559 Vitamin D deficiency, unspecified: Secondary | ICD-10-CM | POA: Diagnosis not present

## 2019-09-25 DIAGNOSIS — Z113 Encounter for screening for infections with a predominantly sexual mode of transmission: Secondary | ICD-10-CM

## 2019-09-25 DIAGNOSIS — E8881 Metabolic syndrome: Secondary | ICD-10-CM | POA: Diagnosis not present

## 2019-09-25 DIAGNOSIS — I10 Essential (primary) hypertension: Secondary | ICD-10-CM

## 2019-09-25 DIAGNOSIS — R87611 Atypical squamous cells cannot exclude high grade squamous intraepithelial lesion on cytologic smear of cervix (ASC-H): Secondary | ICD-10-CM | POA: Diagnosis not present

## 2019-09-25 MED ORDER — DESOGESTREL-ETHINYL ESTRADIOL 0.15-30 MG-MCG PO TABS: 1.0000 | ORAL_TABLET | Freq: Every day | ORAL | 3 refills | Status: DC

## 2019-09-25 NOTE — Patient Instructions (Signed)
Preventive Care 21-26 Years Old, Female Preventive care refers to visits with your health care provider and lifestyle choices that can promote health and wellness. This includes:  A yearly physical exam. This may also be called an annual well check.  Regular dental visits and eye exams.  Immunizations.  Screening for certain conditions.  Healthy lifestyle choices, such as eating a healthy diet, getting regular exercise, not using drugs or products that contain nicotine and tobacco, and limiting alcohol use. What can I expect for my preventive care visit? Physical exam Your health care provider will check your:  Height and weight. This may be used to calculate body mass index (BMI), which tells if you are at a healthy weight.  Heart rate and blood pressure.  Skin for abnormal spots. Counseling Your health care provider may ask you questions about your:  Alcohol, tobacco, and drug use.  Emotional well-being.  Home and relationship well-being.  Sexual activity.  Eating habits.  Work and work environment.  Method of birth control.  Menstrual cycle.  Pregnancy history. What immunizations do I need?  Influenza (flu) vaccine  This is recommended every year. Tetanus, diphtheria, and pertussis (Tdap) vaccine  You may need a Td booster every 10 years. Varicella (chickenpox) vaccine  You may need this if you have not been vaccinated. Human papillomavirus (HPV) vaccine  If recommended by your health care provider, you may need three doses over 6 months. Measles, mumps, and rubella (MMR) vaccine  You may need at least one dose of MMR. You may also need a second dose. Meningococcal conjugate (MenACWY) vaccine  One dose is recommended if you are age 19-21 years and a first-year college student living in a residence hall, or if you have one of several medical conditions. You may also need additional booster doses. Pneumococcal conjugate (PCV13) vaccine  You may need  this if you have certain conditions and were not previously vaccinated. Pneumococcal polysaccharide (PPSV23) vaccine  You may need one or two doses if you smoke cigarettes or if you have certain conditions. Hepatitis A vaccine  You may need this if you have certain conditions or if you travel or work in places where you may be exposed to hepatitis A. Hepatitis B vaccine  You may need this if you have certain conditions or if you travel or work in places where you may be exposed to hepatitis B. Haemophilus influenzae type b (Hib) vaccine  You may need this if you have certain conditions. You may receive vaccines as individual doses or as more than one vaccine together in one shot (combination vaccines). Talk with your health care provider about the risks and benefits of combination vaccines. What tests do I need?  Blood tests  Lipid and cholesterol levels. These may be checked every 5 years starting at age 20.  Hepatitis C test.  Hepatitis B test. Screening  Diabetes screening. This is done by checking your blood sugar (glucose) after you have not eaten for a while (fasting).  Sexually transmitted disease (STD) testing.  BRCA-related cancer screening. This may be done if you have a family history of breast, ovarian, tubal, or peritoneal cancers.  Pelvic exam and Pap test. This may be done every 3 years starting at age 21. Starting at age 30, this may be done every 5 years if you have a Pap test in combination with an HPV test. Talk with your health care provider about your test results, treatment options, and if necessary, the need for more tests.   Follow these instructions at home: Eating and drinking   Eat a diet that includes fresh fruits and vegetables, whole grains, lean protein, and low-fat dairy.  Take vitamin and mineral supplements as recommended by your health care provider.  Do not drink alcohol if: ? Your health care provider tells you not to drink. ? You are  pregnant, may be pregnant, or are planning to become pregnant.  If you drink alcohol: ? Limit how much you have to 0-1 drink a day. ? Be aware of how much alcohol is in your drink. In the [place] one drink equals one 12 oz bottle of beer (355 mL), one 5 oz glass of wine (148 mL), or one 1 oz glass of hard liquor (44 mL). Lifestyle  Take daily care of your teeth and gums.  Stay active. Exercise for at least 30 minutes on 5 or more days each week.  Do not use any products that contain nicotine or tobacco, such as cigarettes, e-cigarettes, and chewing tobacco. If you need help quitting, ask your health care provider.  If you are sexually active, practice safe sex. Use a condom or other form of birth control (contraception) in order to prevent pregnancy and STIs (sexually transmitted infections). If you plan to become pregnant, see your health care provider for a preconception visit. What's next?  Visit your health care provider once a year for a well check visit.  Ask your health care provider how often you should have your eyes and teeth checked.  Stay up to date on all vaccines. This information is not intended to replace advice given to you by your health care provider. Make sure you discuss any questions you have with your health care provider. Document Revised: 12/02/2017 Document Reviewed: 12/02/2017 Elsevier Patient Education  2020 [employer].

## 2019-09-25 NOTE — Progress Notes (Signed)
Name: Rachel Dunlap   MRN: [id number]    DOB: March 24, 1996   Date:09/25/2019       Progress Note  Subjective  Chief Complaint  Chief Complaint  Patient presents with  . Annual Exam    HPI  Patient presents for annual CPE.  Diet: she started cooking more at home, eating more salads, cutting down on carbs, no sodas Exercise: she will start with her friend July st 2021  USPSTF grade A and B recommendations    Office Visit from 09/25/2019 in [facility]  AUDIT-C Score 1     Depression: Phq 9 is  negative Depression screen [facility] 2/9 09/25/2019 03/27/2019 03/06/2019 09/22/2018 08/31/2018  Decreased Interest 0 0 1 0 1  Down, Depressed, Hopeless 0 _0 PHQ - 2 Score 0 _1 Altered sleeping 0 0 0 2 1  Tired, decreased energy _2 Change in appetite 0 1 0 1 1  Feeling bad or failure about yourself  0 0 0 0 0  Trouble concentrating 1 0 0 0 0  Moving slowly or fidgety/restless 0 0 0 0 0  Suicidal thoughts 0 0 0 0 0  PHQ-9 Score _3 Difficult doing work/chores Not difficult at all Not difficult at all Not difficult at all Not difficult at all Somewhat difficult  Some recent data might be hidden   Hypertension: BP Readings from Last 3 Encounters:  09/25/19 120/74  03/27/19 130/70  03/06/19 132/82   Obesity: Wt Readings from Last 3 Encounters:  09/25/19 255 lb 9.6 oz (115.9 kg)  03/27/19 256 lb 4.8 oz (116.3 kg)  03/06/19 252 lb 1.6 oz (114.4 kg)   BMI Readings from Last 3 Encounters:  09/25/19 40.03 kg/m  03/27/19 40.14 kg/m  03/06/19 39.48 kg/m     Hep C Screening: 2020  STD testing and prevention (HIV/chl/gon/syphilis): today  Intimate partner violence: negative screen  Sexual History (Partners/Practices/Protection from [employer] hx STI/Pregnancy Plans): on ocp, afraid to disclose that she has a history of herpes  Pain during Intercourse: not currently sexually active  Menstrual History/LMP/Abnormal Bleeding:  Regular cycles , on  ocp Incontinence Symptoms: no problems   Breast cancer:   - BRCA gene screening: paternal grandmother had breast cancer but she was in there 40's , not indicated   Osteoporosis: Discussed high calcium and vitamin D supplementation, weight bearing exercises  Cervical cancer screening: today   Skin cancer: Discussed monitoring for atypical lesions  ECG: 2019  Advanced Care Planning: A voluntary discussion about advance care planning including the explanation and discussion of advance directives.  Discussed health care proxy and Living will, and the patient was able to identify a health care proxy as mother   Patient does not have a living will at present time.  Lipids: Lab Results  Component Value Date   CHOL 162 09/22/2018   CHOL 182 10/19/2017   CHOL 164 03/13/2016   Lab Results  Component Value Date   HDL 57 09/22/2018   HDL 58 10/19/2017   HDL 60 03/13/2016   Lab Results  Component Value Date   LDLCALC 84 09/22/2018   LDLCALC 103 (H) 10/19/2017   LDLCALC 84 03/13/2016   Lab Results  Component Value Date   TRIG 109 09/22/2018   TRIG 117 10/19/2017   TRIG 100 03/13/2016   Lab Results  Component Value Date   CHOLHDL 2.8 09/22/2018   CHOLHDL 3.1 10/19/2017  CHOLHDL 2.7 03/13/2016   No results found for: LDLDIRECT  Glucose: Glucose  Date Value Ref Range Status  03/28/2018 92 65 - 99 mg/dL Final   Glucose, Bld  Date Value Ref Range Status  09/22/2018 78 65 - 99 mg/dL Final    Comment:    .            Fasting reference interval .   10/19/2017 82 65 - 99 mg/dL Final    Comment:    .            Fasting reference interval .   03/13/2016 89 65 - 99 mg/dL Final    Patient Active Problem List   Diagnosis Date Noted  . Eczema 08/31/2018  . Insulin resistance 05/16/2018  . Depression 05/16/2018  . Prediabetes 02/14/2018  . Essential hypertension 02/14/2018  . Class 2 severe obesity with serious comorbidity and body mass index (BMI) of 39.0 to 39.9 in  adult ([place]) 02/14/2018  . Moderate recurrent major depression ([place]) 02/16/2017  . Back pain due to injury 09/18/2016  . Acne vulgaris 09/16/2015  . Hypertrichosis 09/16/2015  . Chronic constipation 11/09/2014  . Chronic dermatitis 11/09/2014  . Dysmetabolic syndrome 98/92/1194  . Hypertension, benign 11/09/2014  . Genital herpes in women 11/09/2014  . Mild intermittent asthma 11/09/2014  . Allergic rhinitis, seasonal 11/09/2014  . Class 2 severe obesity with serious comorbidity and body mass index (BMI) of 38.0 to 38.9 in adult ([place]) 11/09/2014  . IBS (irritable bowel syndrome) 11/09/2014  . Bilateral polycystic ovarian syndrome 11/09/2014  . Abnormal presence of protein in urine 11/09/2014  . Vitamin D deficiency 11/09/2014  . Anxiety 11/09/2014    History reviewed. No pertinent surgical history.  Family History  Problem Relation Age of Onset  . Hypertension Mother   . Obesity Mother   . Cancer Father   . Diabetes Father   . Kidney disease Father   . Obesity Maternal Grandmother     Social History   Socioeconomic History  . Marital status: Single    Spouse name: Not on file  . Number of children: 0  . Years of education: Not on file  . Highest education level: Some college, no degree  Occupational History  . Occupation: gas station attendant.     Comment: [employer]  Tobacco Use  . Smoking status: Never Smoker  . Smokeless tobacco: Never Used  Vaping Use  . Vaping Use: Never used  Substance and Sexual Activity  . Alcohol use: Yes    Alcohol/week: 0.0 standard drinks    Comment: seldom  . Drug use: No  . Sexual activity: Not Currently    Partners: Male    Birth control/protection: Pill, Abstinence  Other Topics Concern  . Not on file  Social History Narrative   She wemt to school at [employer] - graduated Dec  2019 - hospitality and tourism management - she wants to be chef-goal is to work at a [place].   She works part time at [employer]: [place]   . Difficulty of Paying Living Expenses: Not hard at all  Food Insecurity: No Food Insecurity  . Worried About Charity fundraiser in the Last Year: Never true  . Ran Out of Food in the Last Year: Never true  Transportation Needs: No Transportation Needs  . Lack of Transportation (Medical): No  . Lack of Transportation (Non-Medical): No  Physical Activity:  Sufficiently Active  . Days of Exercise per Week: 3 days  . Minutes of Exercise per Session: 120 min  Stress: No Stress Concern Present  . Feeling of Stress : Not at all  Social Connections: Moderately Isolated  . Frequency of Communication with Friends and Family: Three times a week  . Frequency of Social Gatherings with Friends and Family: Three times a week  . Attends Religious Services: 1 to 4 times per year  . Active Member of Clubs or Organizations: No  . Attends Archivist Meetings: Never  . Marital Status: Never married  Intimate Partner Violence: Not At Risk  . Fear of Current or Ex-Partner: No  . Emotionally Abused: No  . Physically Abused: No  . Sexually Abused: No     Current Outpatient Medications:  .  albuterol (VENTOLIN HFA) 108 (90 Base) MCG/ACT inhaler, Inhale 2 puffs into the lungs every 6 (six) hours as needed for wheezing or shortness of breath., Disp: 6.7 Inhaler, Rfl: 0 .  APRI 0.15-30 MG-MCG tablet, TAKE 1 TABLET BY MOUTH EVERY DAY, Disp: 84 tablet, Rfl: 2 .  atenolol (TENORMIN) 25 MG tablet, TAKE 1 TABLET BY MOUTH EVERY EVENING FOR BP AND ANXIETY, Disp: 90 tablet, Rfl: 0 .  Blood Pressure Monitoring (OMRON 5 SERIES BP MONITOR) DEVI, , Disp: , Rfl:  .  desoximetasone (TOPICORT) 0.25 % cream, Apply 1 application topically 2 (two) times daily., Disp: 100 g, Rfl: 0 .  enalapril (VASOTEC) 5 MG tablet, Take 1 tablet by mouth daily., Disp: , Rfl:  .  fluticasone (FLONASE) 50 MCG/ACT nasal spray, Place 2 sprays into both nostrils at  bedtime., Disp: 48 g, Rfl: 1 .  ketoconazole (NIZORAL) 2 % cream, Apply 1 application topically at bedtime., Disp: 60 g, Rfl: 0 .  loratadine (CLARITIN) 10 MG tablet, TAKE 1 TABLET BY MOUTH EVERY DAY, Disp: 90 tablet, Rfl: 2 .  montelukast (SINGULAIR) 10 MG tablet, TAKE 1 TABLET BY MOUTH EVERYDAY AT BEDTIME, Disp: 90 tablet, Rfl: 2 .  polyethylene glycol powder (GLYCOLAX/MIRALAX) 17 GM/SCOOP powder, Take 17 g by mouth 2 (two) times daily as needed., Disp: 3350 g, Rfl: 1 .  triamcinolone cream (KENALOG) 0.1 %, Apply 1 application topically 2 (two) times daily., Disp: 45 g, Rfl: 0 .  valACYclovir (VALTREX) 500 MG tablet, Take 1 tablet (500 mg total) by mouth daily. And twice daily on outbreaks, Disp: 115 tablet, Rfl: 1 .  metFORMIN (GLUCOPHAGE) 500 MG tablet, Take 1 tablet (500 mg total) by mouth daily with breakfast. (Patient not taking: Reported on 09/25/2019), Disp: 90 tablet, Rfl: 1  No Known Allergies   ROS  Constitutional: Negative for fever or weight change.  Respiratory: Negative for cough and shortness of breath.   Cardiovascular: Negative for chest pain or palpitations.  Gastrointestinal: Negative for abdominal pain, no bowel changes.  Musculoskeletal: Negative for gait problem or joint swelling.  Skin: Negative for rash.  Neurological: Negative for dizziness or headache.  No other specific complaints in a complete review of systems (except as listed in HPI above).  Objective  Vitals:   09/25/19 0907  BP: 120/74  Pulse: 80  Resp: 18  Temp: 98.1 F (36.7 C)  TempSrc: Temporal  SpO2: 98%  Weight: 255 lb 9.6 oz (115.9 kg)  Height: _0  (1.702 m)    Body mass index is 40.03 kg/m.  Physical Exam  Constitutional: Patient appears well-developed and well-nourished. Obese No distress.  HENT: Head: Normocephalic and atraumatic. Ears: B TMs ok,  no erythema or effusion; Nose: Nose normal. Mouth/Throat: not done  Eyes: Conjunctivae and EOM are normal. Pupils are equal, round,  and reactive to light. No scleral icterus.  Neck: Normal range of motion. Neck supple. No JVD present. No thyromegaly present.  Cardiovascular: Normal rate, regular rhythm and normal heart sounds.  No murmur heard. No BLE edema. Pulmonary/Chest: Effort normal and breath sounds normal. No respiratory distress. Abdominal: Soft. Bowel sounds are normal, no distension. There is no tenderness. no masses Breast: no lumps or masses, no nipple discharge or rashes FEMALE GENITALIA:  External genitalia normal External urethra normal Vaginal vault normal without discharge or lesions Cervix normal without discharge or lesions Bimanual exam normal without masses RECTAL: not done Musculoskeletal: Normal range of motion, no joint effusions. No gross deformities Neurological: he is alert and oriented to person, place, and time. No cranial nerve deficit. Coordination, balance, strength, speech and gait are normal.  Skin: Skin is warm and dry. No rash noted. No erythema.  Psychiatric: Patient has a normal mood and affect. behavior is normal. Judgment and thought content normal.  Fall Risk: Fall Risk  09/25/2019 03/27/2019 03/06/2019 09/22/2018 08/31/2018  Falls in the past year? 1 0 0 0 0  Number falls in past yr: 0 0 0 0 0  Injury with Fall? 0 0 0 0 0     Functional Status Survey: Is the patient deaf or have difficulty hearing?: No Does the patient have difficulty seeing, even when wearing glasses/contacts?: No Does the patient have difficulty concentrating, remembering, or making decisions?: No Does the patient have difficulty walking or climbing stairs?: No Does the patient have difficulty dressing or bathing?: No Does the patient have difficulty doing errands alone such as visiting a doctor's office or shopping?: No   Assessment & Plan  1. Well adult exam  - CBC with Differential/Platelet - COMPLETE METABOLIC PANEL WITH GFR - Lipid panel - VITAMIN D 25 Hydroxy (Vit-D Deficiency,  Fractures) - Hemoglobin A1c - RPR - HIV Antibody (routine testing w rflx) - Cytology - PAP - Vitamin B12  2. Cervical cancer screening  - Cytology - PAP  3. Metabolic syndrome  Q5Z  4. Vitamin D deficiency  - VITAMIN D 25 Hydroxy (Vit-D Deficiency, Fractures)  5. Morbid obesity ([place])  Discussed with the patient the risk posed by an increased BMI. Discussed importance of portion control, calorie counting and at least 150 minutes of physical activity weekly. Avoid sweet beverages and drink more water. Eat at least 6 servings of fruit and vegetables daily   6. Hypertension, benign  - CBC with Differential/Platelet - COMPLETE METABOLIC PANEL WITH GFR - Lipid panel  7. Long-term use of high-risk medication  - Vitamin B12  8. Routine screening for STI (sexually transmitted infection)  - RPR - HIV Antibody (routine testing w rflx)  9. Encounter for surveillance of contraceptive pills  - desogestrel-ethinyl estradiol (APRI) 0.15-30 MG-MCG tablet; Take 1 tablet by mouth daily.  Dispense: 84 tablet; Refill: 3   -USPSTF grade A and B recommendations reviewed with patient; age-appropriate recommendations, preventive care, screening tests, etc discussed and encouraged; healthy living encouraged; see AVS for patient education given to patient -Discussed importance of 150 minutes of physical activity weekly, eat two servings of fish weekly, eat one serving of tree nuts ( cashews, pistachios, pecans, almonds.Marland Kitchen) every other day, eat 6 servings of fruit/vegetables daily and drink plenty of water and avoid sweet beverages.

## 2019-09-26 LAB — CBC WITH DIFFERENTIAL/PLATELET
Absolute Monocytes: 692 cells/uL (ref 200–950)
Basophils Absolute: 36 cells/uL (ref 0–200)
Basophils Relative: 0.4 %
Eosinophils Absolute: 127 cells/uL (ref 15–500)
Eosinophils Relative: 1.4 %
HCT: 36.5 % (ref 35.0–45.0)
Hemoglobin: 11.8 g/dL (ref 11.7–15.5)
Lymphs Abs: 2521 cells/uL (ref 850–3900)
MCH: 26.9 pg — ABNORMAL LOW (ref 27.0–33.0)
MCHC: 32.3 g/dL (ref 32.0–36.0)
MCV: 83.3 fL (ref 80.0–100.0)
MPV: 12 fL (ref 7.5–12.5)
Monocytes Relative: 7.6 %
Neutro Abs: 5724 cells/uL (ref 1500–7800)
Neutrophils Relative %: 62.9 %
Platelets: 215 10*3/uL (ref 140–400)
RBC: 4.38 10*6/uL (ref 3.80–5.10)
RDW: 12.9 % (ref 11.0–15.0)
Total Lymphocyte: 27.7 %
WBC: 9.1 10*3/uL (ref 3.8–10.8)

## 2019-09-26 LAB — FLUORESCENT TREPONEMAL AB(FTA)-IGG-BLD: Fluorescent Treponemal ABS: NONREACTIVE

## 2019-09-26 LAB — VITAMIN B12: Vitamin B-12: 371 pg/mL (ref 200–1100)

## 2019-09-26 LAB — COMPLETE METABOLIC PANEL WITH GFR
AG Ratio: 1.3 (calc) (ref 1.0–2.5)
ALT: 13 U/L (ref 6–29)
AST: 15 U/L (ref 10–30)
Albumin: 4.1 g/dL (ref 3.6–5.1)
Alkaline phosphatase (APISO): 42 U/L (ref 31–125)
BUN: 10 mg/dL (ref 7–25)
CO2: 20 mmol/L (ref 20–32)
Calcium: 9.5 mg/dL (ref 8.6–10.2)
Chloride: 106 mmol/L (ref 98–110)
Creat: 0.82 mg/dL (ref 0.50–1.10)
GFR, Est African American: 115 mL/min/{1.73_m2} (ref >=60)
GFR, Est Non African American: 99 mL/min/{1.73_m2} (ref >=60)
Globulin: 3.1 g/dL (calc) (ref 1.9–3.7)
Glucose, Bld: 91 mg/dL (ref 65–99)
Potassium: 4.4 mmol/L (ref 3.5–5.3)
Sodium: 139 mmol/L (ref 135–146)
Total Bilirubin: 0.3 mg/dL (ref 0.2–1.2)
Total Protein: 7.2 g/dL (ref 6.1–8.1)

## 2019-09-26 LAB — LIPID PANEL
Cholesterol: 169 mg/dL (ref <200)
HDL: 71 mg/dL (ref >=50)
LDL Cholesterol (Calc): 84 mg/dL (calc)
Non-HDL Cholesterol (Calc): 98 mg/dL (calc) (ref <130)
Total CHOL/HDL Ratio: 2.4 (calc) (ref <5.0)
Triglycerides: 60 mg/dL (ref <150)

## 2019-09-26 LAB — HEMOGLOBIN A1C
Hgb A1c MFr Bld: 5.3 % of total Hgb (ref <5.7)
Mean Plasma Glucose: 105 (calc)
eAG (mmol/L): 5.8 (calc)

## 2019-09-26 LAB — RPR TITER: RPR Titer: 1:1 {titer} — ABNORMAL HIGH

## 2019-09-26 LAB — RPR: RPR Ser Ql: REACTIVE — AB

## 2019-09-26 LAB — VITAMIN D 25 HYDROXY (VIT D DEFICIENCY, FRACTURES): Vit D, 25-Hydroxy: 23 ng/mL — ABNORMAL LOW (ref 30–100)

## 2019-09-26 LAB — HIV ANTIBODY (ROUTINE TESTING W REFLEX): HIV 1&2 Ab, 4th Generation: NONREACTIVE

## 2019-09-27 LAB — CYTOLOGY - PAP
Chlamydia: NEGATIVE
Comment: NEGATIVE
Comment: NEGATIVE
Comment: NORMAL
Diagnosis: HIGH — AB
High risk HPV: POSITIVE — AB
Neisseria Gonorrhea: NEGATIVE

## 2019-09-29 ENCOUNTER — Other Ambulatory Visit: Payer: Self-pay | Admitting: Family Medicine

## 2019-09-29 DIAGNOSIS — R87611 Atypical squamous cells cannot exclude high grade squamous intraepithelial lesion on cytologic smear of cervix (ASC-H): Secondary | ICD-10-CM

## 2019-10-18 ENCOUNTER — Encounter: Payer: Self-pay | Admitting: Obstetrics & Gynecology

## 2019-10-18 ENCOUNTER — Other Ambulatory Visit (HOSPITAL_COMMUNITY)
Admission: RE | Admit: 2019-10-18 | Discharge: 2019-10-18 | Disposition: A | Discharge status: Home / Self Care | Payer: Federal, State, Local not specified - PPO | Source: Ambulatory Visit | Attending: Obstetrics & Gynecology | Admitting: Obstetrics & Gynecology

## 2019-10-18 ENCOUNTER — Ambulatory Visit (INDEPENDENT_AMBULATORY_CARE_PROVIDER_SITE_OTHER): Payer: Federal, State, Local not specified - PPO | Admitting: Obstetrics & Gynecology

## 2019-10-18 ENCOUNTER — Other Ambulatory Visit: Payer: Self-pay

## 2019-10-18 VITALS — BP 122/80 | Ht 67.0 in | Wt 251.0 lb

## 2019-10-18 DIAGNOSIS — N870 Mild cervical dysplasia: Secondary | ICD-10-CM | POA: Diagnosis not present

## 2019-10-18 DIAGNOSIS — N871 Moderate cervical dysplasia: Secondary | ICD-10-CM | POA: Diagnosis not present

## 2019-10-18 DIAGNOSIS — R87611 Atypical squamous cells cannot exclude high grade squamous intraepithelial lesion on cytologic smear of cervix (ASC-H): Secondary | ICD-10-CM | POA: Diagnosis not present

## 2019-10-18 NOTE — Patient Instructions (Signed)

## 2019-10-18 NOTE — Progress Notes (Signed)
Referring Provider:  Dr Carlynn Purl  HPI:  Rachel Dunlap is a 24 y.o.  G0P0000  who presents today for evaluation and management of abnormal cervical cytology.    Dysplasia History:  ASC-H  ROS:  Pertinent items are noted in HPI.  OB History  Gravida Para Term Preterm AB Living  0 0 0 0 0 0  SAB TAB Ectopic Multiple Live Births  0 0 0 0 0    Past Medical History:  Diagnosis Date  . Acne   . Allergy   . Anxiety   . Asthma   . Chronic constipation   . Chronic dermatitis   . Constipation   . Depression   . Dermatitis   . Dysmetabolic syndrome   . Genital herpes   . Hypertension   . Irritable bowel syndrome without diarrhea   . Kidney disease   . Morbid obesity with BMI of 40.0-44.9, adult ([facility])   . Panic disorder   . PCOS (polycystic ovarian syndrome)   . Pre-diabetes   . Proteinuria   . Stomach ulcer   . Vitamin D deficiency     History reviewed. No pertinent surgical history.  SOCIAL HISTORY: Social History   Substance and Sexual Activity  Alcohol Use Yes  . Alcohol/week: 0.0 standard drinks   Comment: seldom   Social History   Substance and Sexual Activity  Drug Use No     Family History  Problem Relation Age of Onset  . Hypertension Mother   . Obesity Mother   . Cancer Father   . Diabetes Father   . Kidney disease Father   . Obesity Maternal Grandmother     ALLERGIES:  Patient has no known allergies.  Current Outpatient Medications on File Prior to Visit  Medication Sig Dispense Refill  . albuterol (VENTOLIN HFA) 108 (90 Base) MCG/ACT inhaler Inhale 2 puffs into the lungs every 6 (six) hours as needed for wheezing or shortness of breath. 6.7 Inhaler 0  . atenolol (TENORMIN) 25 MG tablet TAKE 1 TABLET BY MOUTH EVERY EVENING FOR BP AND ANXIETY 90 tablet 0  . Blood Pressure Monitoring (OMRON 5 SERIES BP MONITOR) DEVI     . desogestrel-ethinyl estradiol (APRI) 0.15-30 MG-MCG tablet Take 1 tablet by mouth daily. 84 tablet 3  . desoximetasone  (TOPICORT) 0.25 % cream Apply 1 application topically 2 (two) times daily. 100 g 0  . enalapril (VASOTEC) 5 MG tablet Take 1 tablet by mouth daily.    . fluticasone (FLONASE) 50 MCG/ACT nasal spray Place 2 sprays into both nostrils at bedtime. 48 g 1  . ketoconazole (NIZORAL) 2 % cream Apply 1 application topically at bedtime. 60 g 0  . loratadine (CLARITIN) 10 MG tablet TAKE 1 TABLET BY MOUTH EVERY DAY 90 tablet 2  . montelukast (SINGULAIR) 10 MG tablet TAKE 1 TABLET BY MOUTH EVERYDAY AT BEDTIME 90 tablet 2  . polyethylene glycol powder (GLYCOLAX/MIRALAX) 17 GM/SCOOP powder Take 17 g by mouth 2 (two) times daily as needed. 3350 g 1  . triamcinolone cream (KENALOG) 0.1 % Apply 1 application topically 2 (two) times daily. 45 g 0  . valACYclovir (VALTREX) 500 MG tablet Take 1 tablet (500 mg total) by mouth daily. And twice daily on outbreaks 115 tablet 1   No current facility-administered medications on file prior to visit.    Physical Exam: -Vitals:  BP 122/80   Ht 5\' 7"  (1.702 m)   Wt 251 lb (113.9 kg)   LMP 10/12/2019   BMI 39.31  kg/m  GEN: WD, WN, NAD.  A+ O x 3, good mood and affect. ABD:  NT, ND.  Soft, no masses.  No hernias noted.   Pelvic:   Vulva: Normal appearance.  No lesions.  Vagina: No lesions or abnormalities noted.  Support: Normal pelvic support.  Urethra No masses tenderness or scarring.  Meatus Normal size without lesions or prolapse.  Cervix: See below.  Anus: Normal exam.  No lesions.  Perineum: Normal exam.  No lesions.        Bimanual   Uterus: Normal size.  Non-tender.  Mobile.  AV.  Adnexae: No masses.  Non-tender to palpation.  Cul-de-sac: Negative for abnormality.  Physical Exam Genitourinary:        PROCEDURE: 1.  Urine Pregnancy Test:  not done 2.  Colposcopy performed with 4% acetic acid after verbal consent obtained                                         -Aceto-white Lesions Location(s): 6 o'clock.              -Biopsy performed at 6,  12 o'clock               -ECC indicated and performed: Yes.       -Biopsy sites made hemostatic with pressure, AgNO3, and/or Monsel's solution   -Satisfactory colposcopy: Yes.      -Evidence of Invasive cervical CA :  NO  ASSESSMENT:  Rachel Dunlap is a 24 y.o. G0P0000 here for  1. Atypical squamous cells cannot exclude high grade squamous intraepithelial lesion on cytologic smear of cervix (ASC-H)   .  PLAN: 1.  I discussed the grading system of pap smears and HPV high risk viral types.  We will discuss and base management after colpo results return. 2. Follow up PAP 6 months, vs intervention if high grade dysplasia identified 3. Treatment of persistantly abnormal PAP smears and cervical dysplasia, even mild, is discussed w pt today in detail, as well as the pros and cons of Cryo and LEEP procedures. Will consider and discuss after results.      Annamarie Major, MD, [facility], [facility] 10/18/2019  3:24 PM

## 2019-10-20 ENCOUNTER — Telehealth: Payer: Self-pay | Admitting: Obstetrics & Gynecology

## 2019-10-20 LAB — SURGICAL PATHOLOGY

## 2019-10-20 NOTE — Telephone Encounter (Signed)
-----   Message from Nadara Mustard, MD sent at 10/20/2019  1:20 PM EDT ----- Results d/w pt, plan Cryo.  [place] Dec, schedule Cryo Cervical procedure w PH in office in August

## 2019-10-20 NOTE — Telephone Encounter (Signed)
Called and left voicemail for patient to call back to be scheduled. 

## 2019-10-20 NOTE — Progress Notes (Signed)
Results d/w pt, plan Cryo.  [place] Dec, schedule Cryo Cervical procedure w PH in office in August

## 2019-10-20 NOTE — Telephone Encounter (Signed)
Patient is scheduled for 11/14/19 with [facility]

## 2019-10-25 DIAGNOSIS — R350 Frequency of micturition: Secondary | ICD-10-CM | POA: Diagnosis not present

## 2019-10-25 DIAGNOSIS — R809 Proteinuria, unspecified: Secondary | ICD-10-CM | POA: Diagnosis not present

## 2019-10-25 DIAGNOSIS — I10 Essential (primary) hypertension: Secondary | ICD-10-CM | POA: Diagnosis not present

## 2019-10-27 DIAGNOSIS — R350 Frequency of micturition: Secondary | ICD-10-CM | POA: Diagnosis not present

## 2019-10-27 DIAGNOSIS — R809 Proteinuria, unspecified: Secondary | ICD-10-CM | POA: Diagnosis not present
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
## 2019-11-13 DIAGNOSIS — Z20822 Contact with and (suspected) exposure to covid-19: Secondary | ICD-10-CM | POA: Diagnosis not present

## 2019-11-13 DIAGNOSIS — Z03818 Encounter for observation for suspected exposure to other biological agents ruled out: Secondary | ICD-10-CM | POA: Diagnosis not present

## 2019-11-13 NOTE — Telephone Encounter (Signed)
Called and spoke with patient and rescheduled to 12/05/19 with [facility]

## 2019-11-14 ENCOUNTER — Ambulatory Visit: Payer: Federal, State, Local not specified - PPO | Admitting: Obstetrics & Gynecology

## 2019-11-29 NOTE — Progress Notes (Signed)
Name: Rachel Dunlap   MRN: [id number]    DOB: 02/21/1996   Date:11/30/2019       Progress Note  Subjective  Chief Complaint  Chief Complaint  Patient presents with  . Follow-up    She has no concerns today.    HPI  Obesity:She could not afford Belviq or Ozempic. Shelost 7 lbs since last visit, she is eating healthy, portion control, drinking more water and going to the gym with her friend - initially 5 days a week but now not as often but will resume .   Depression Major: she is back in therapy with [facility] because she recently went on vacation to [place] with all her family and they noticed that she snappy which is not her normal behavior and she realized she needed help again. She was taking Wellbutrin XL 150mg  , she states her mother feels like she should take something, she is willing to try something else. Denies suicidal thoughts or ideation   HTN: bp is okay,shedenies sob or chest pain. Also sees Dr. and is taking vasotec because of microalbuminuria. She is aware of teratogenic effects of ACEShe is also on Atenolol Compliant with medication   Pre-diabetes: she denies polyphagia, polydipsia or polyuria.Fasting insulin elevated, she has been physically active and eating healthy   AR: doing well at this time, taking medications, no rhinorrhea or congestion. Taking singulair and loratadine   Asthma: she is taking singulair daily and has not needed to use rescue inhaler, denies wheezing, cough or SOB. Last flare was over one year ago Unchanged   Genital herpes: she takes valtrex daily, she states last outbreak was years ago when she was first diagnosed.  Unchanged   Constipation: long history of constipation, currently using Miralax and has been working well as long as she takes it daily . No straining or blood in stools. She has been water and exercising but when she stops miralax she gets constipated again  Rash feet: she states both of her feet have  dry skin, not itchy, we will try anti-fungal medication   Patient Active Problem List   Diagnosis Date Noted  . Eczema 08/31/2018  . Insulin resistance 05/16/2018  . Depression 05/16/2018  . Prediabetes 02/14/2018  . Essential hypertension 02/14/2018  . Class 2 severe obesity with serious comorbidity and body mass index (BMI) of 39.0 to 39.9 in adult (HCC) 02/14/2018  . Moderate recurrent major depression (HCC) 02/16/2017  . Back pain due to injury 09/18/2016  . Acne vulgaris 09/16/2015  . Hypertrichosis 09/16/2015  . Chronic constipation 11/09/2014  . Chronic dermatitis 11/09/2014  . Dysmetabolic syndrome 11/09/2014  . Hypertension, benign 11/09/2014  . Genital herpes in women 11/09/2014  . Mild intermittent asthma 11/09/2014  . Allergic rhinitis, seasonal 11/09/2014  . Class 2 severe obesity with serious comorbidity and body mass index (BMI) of 38.0 to 38.9 in adult (HCC) 11/09/2014  . IBS (irritable bowel syndrome) 11/09/2014  . Bilateral polycystic ovarian syndrome 11/09/2014  . Abnormal presence of protein in urine 11/09/2014  . Vitamin D deficiency 11/09/2014  . Anxiety 11/09/2014    History reviewed. No pertinent surgical history.  Family History  Problem Relation Age of Onset  . Hypertension Mother   . Obesity Mother   . Cancer Father   . Diabetes Father   . Kidney disease Father   . Obesity Maternal Grandmother     Social History   Tobacco Use  . Smoking status: Never Smoker  . Smokeless tobacco: Never  Used  Substance Use Topics  . Alcohol use: Yes    Alcohol/week: 0.0 standard drinks    Comment: seldom     Current Outpatient Medications:  .  albuterol (VENTOLIN HFA) 108 (90 Base) MCG/ACT inhaler, Inhale 2 puffs into the lungs every 6 (six) hours as needed for wheezing or shortness of breath., Disp: 6.7 Inhaler, Rfl: 0 .  atenolol (TENORMIN) 25 MG tablet, TAKE 1 TABLET BY MOUTH EVERY EVENING FOR BP AND ANXIETY, Disp: 90 tablet, Rfl: 0 .  Blood Pressure  Monitoring (OMRON 5 SERIES BP MONITOR) DEVI, , Disp: , Rfl:  .  desogestrel-ethinyl estradiol (APRI) 0.15-30 MG-MCG tablet, Take 1 tablet by mouth daily., Disp: 84 tablet, Rfl: 3 .  desoximetasone (TOPICORT) 0.25 % cream, Apply 1 application topically 2 (two) times daily., Disp: 100 g, Rfl: 0 .  enalapril (VASOTEC) 5 MG tablet, Take 1 tablet by mouth daily., Disp: , Rfl:  .  fluticasone (FLONASE) 50 MCG/ACT nasal spray, Place 2 sprays into both nostrils at bedtime., Disp: 48 g, Rfl: 1 .  ketoconazole (NIZORAL) 2 % cream, Apply 1 application topically at bedtime., Disp: 60 g, Rfl: 0 .  loratadine (CLARITIN) 10 MG tablet, TAKE 1 TABLET BY MOUTH EVERY DAY, Disp: 90 tablet, Rfl: 2 .  montelukast (SINGULAIR) 10 MG tablet, TAKE 1 TABLET BY MOUTH EVERYDAY AT BEDTIME, Disp: 90 tablet, Rfl: 2 .  polyethylene glycol powder (GLYCOLAX/MIRALAX) 17 GM/SCOOP powder, Take 17 g by mouth 2 (two) times daily as needed., Disp: 3350 g, Rfl: 1 .  triamcinolone cream (KENALOG) 0.1 %, Apply 1 application topically 2 (two) times daily., Disp: 45 g, Rfl: 0 .  valACYclovir (VALTREX) 500 MG tablet, Take 1 tablet (500 mg total) by mouth daily. And twice daily on outbreaks, Disp: 115 tablet, Rfl: 1  No Known Allergies  I personally reviewed active problem list, medication list, allergies, family history, social history, health maintenance with the patient/caregiver today.   ROS  Constitutional: Negative for fever or weight change.  Respiratory: Negative for cough and shortness of breath.   Cardiovascular: Negative for chest pain or palpitations.  Gastrointestinal: Negative for abdominal pain, no bowel changes.  Musculoskeletal: Negative for gait problem or joint swelling.  Skin: Negative for rash.  Neurological: Negative for dizziness or headache.  No other specific complaints in a complete review of systems (except as listed in HPI above).   Objective  Vitals:   11/30/19 1020  BP: 130/84  Pulse: 75  Resp: 16   Temp: 98 F (36.7 C)  TempSrc: Oral  SpO2: 100%  Weight: 248 lb 14.4 oz (112.9 kg)  Height: 5\' 7"  (1.702 m)    Body mass index is 38.98 kg/m.  Physical Exam  Constitutional: Patient appears well-developed and well-nourished. Obese  No distress.  HEENT: head atraumatic, normocephalic, pupils equal and reactive to light, , neck supple Cardiovascular: Normal rate, regular rhythm and normal heart sounds.  No murmur heard. No BLE edema. Pulmonary/Chest: Effort normal and breath sounds normal. No respiratory distress. Abdominal: Soft.  There is no tenderness. Psychiatric: Patient has a normal mood and affect. behavior is normal. Judgment and thought content normal.  Recent Results (from the past 2160 hour(s))  Cytology - PAP     Status: Abnormal   Collection Time: 09/25/19  9:51 AM  Result Value Ref Range   High risk HPV Positive (A)    Neisseria Gonorrhea Negative    Chlamydia Negative    Adequacy      Satisfactory for evaluation;  transformation zone component PRESENT.   Diagnosis (A)     - Atypical squamous cells, cannot exclude high grade squamous   Diagnosis intraepithelial lesion (ASC-H) (A)    Comment Normal Reference Ranger Chlamydia - Negative    Comment      Normal Reference Range Neisseria Gonorrhea - Negative   Comment Normal Reference Range HPV - Negative   CBC with Differential/Platelet     Status: Abnormal   Collection Time: 09/25/19 10:14 AM  Result Value Ref Range   WBC 9.1 3.8 - 10.8 Thousand/uL   RBC 4.38 3.80 - 5.10 Million/uL   Hemoglobin 11.8 11.7 - 15.5 g/dL   HCT 19.1 35 - 45 %   MCV 83.3 80.0 - 100.0 fL   MCH 26.9 (L) 27.0 - 33.0 pg   MCHC 32.3 32.0 - 36.0 g/dL   RDW 47.8 29.5 - 62.1 %   Platelets 215 140 - 400 Thousand/uL   MPV 12.0 7.5 - 12.5 fL   Neutro Abs 5,724 1,500 - 7,800 cells/uL   Lymphs Abs 2,521 850 - 3,900 cells/uL   Absolute Monocytes 692 200 - 950 cells/uL   Eosinophils Absolute 127 15 - 500 cells/uL   Basophils Absolute 36 0 -  200 cells/uL   Neutrophils Relative % 62.9 %   Total Lymphocyte 27.7 %   Monocytes Relative 7.6 %   Eosinophils Relative 1.4 %   Basophils Relative 0.4 %  COMPLETE METABOLIC PANEL WITH GFR     Status: None   Collection Time: 09/25/19 10:14 AM  Result Value Ref Range   Glucose, Bld 91 65 - 99 mg/dL    Comment: .            Fasting reference interval .    BUN 10 7 - 25 mg/dL   Creat 3.08 6.57 - 8.46 mg/dL   GFR, Est Non African American 99 > OR = 60 mL/min/1.3m2   GFR, Est African American 115 > OR = 60 mL/min/1.56m2   BUN/Creatinine Ratio NOT APPLICABLE 6 - 22 (calc)   Sodium 139 135 - 146 mmol/L   Potassium 4.4 3.5 - 5.3 mmol/L   Chloride 106 98 - 110 mmol/L   CO2 20 20 - 32 mmol/L   Calcium 9.5 8.6 - 10.2 mg/dL   Total Protein 7.2 6.1 - 8.1 g/dL   Albumin 4.1 3.6 - 5.1 g/dL   Globulin 3.1 1.9 - 3.7 g/dL (calc)   AG Ratio 1.3 1.0 - 2.5 (calc)   Total Bilirubin 0.3 0.2 - 1.2 mg/dL   Alkaline phosphatase (APISO) 42 31 - 125 U/L   AST 15 10 - 30 U/L   ALT 13 6 - 29 U/L  Lipid panel     Status: None   Collection Time: 09/25/19 10:14 AM  Result Value Ref Range   Cholesterol 169 <200 mg/dL   HDL 71 > OR = 50 mg/dL   Triglycerides 60 <962 mg/dL   LDL Cholesterol (Calc) 84 mg/dL (calc)    Comment: Reference range: <100 . Desirable range <100 mg/dL for primary prevention;   <70 mg/dL for patients with CHD or diabetic patients  with > or = 2 CHD risk factors. Marland Kitchen LDL-C is now calculated using the Martin-Hopkins  calculation, which is a validated novel method providing  better accuracy than the Friedewald equation in the  estimation of LDL-C.  Horald Pollen et al. Lenox Ahr. 9528;413(24): 2061-2068  (http://education.[facility].com/faq/FAQ164)    Total CHOL/HDL Ratio 2.4 <5.0 (calc)   Non-HDL Cholesterol (Calc) 98 <401  mg/dL (calc)    Comment: For patients with diabetes plus 1 major ASCVD risk  factor, treating to a non-HDL-C goal of <100 mg/dL  (LDL-C of <53 mg/dL) is  considered a therapeutic  option.   VITAMIN D 25 Hydroxy (Vit-D Deficiency, Fractures)     Status: Abnormal   Collection Time: 09/25/19 10:14 AM  Result Value Ref Range   Vit D, 25-Hydroxy 23 (L) 30 - 100 ng/mL    Comment: Vitamin D Status         25-OH Vitamin D: . Deficiency:                    <20 ng/mL Insufficiency:             20 - 29 ng/mL Optimal:                 > or = 30 ng/mL . For 25-OH Vitamin D testing on patients on  D2-supplementation and patients for whom quantitation  of D2 and D3 fractions is required, the Quest[facility]ureD(TM) 25-OH VIT D, (D2,D3), LC/MS/MS is recommended: order  code 66440 (patients >67yrs). See Note 1 . Note 1 . For additional information, please refer to  http://education.[facility].com/faq/FAQ199  (This link is being provided for informational/ educational purposes only.)   Hemoglobin A1c     Status: None   Collection Time: 09/25/19 10:14 AM  Result Value Ref Range   Hgb A1c MFr Bld 5.3 <5.7 % of total Hgb    Comment: For the purpose of screening for the presence of diabetes: . <5.7%       Consistent with the absence of diabetes 5.7-6.4%    Consistent with increased risk for diabetes             (prediabetes) > or =6.5%  Consistent with diabetes . This assay result is consistent with a decreased risk of diabetes. . Currently, no consensus exists regarding use of hemoglobin A1c for diagnosis of diabetes in children. . According to American Diabetes Association (ADA) guidelines, hemoglobin A1c <7.0% represents optimal control in non-pregnant diabetic patients. Different metrics may apply to specific patient populations.  Standards of Medical Care in Diabetes(ADA). .    Mean Plasma Glucose 105 (calc)   eAG (mmol/L) 5.8 (calc)  RPR     Status: Abnormal   Collection Time: 09/25/19 10:14 AM  Result Value Ref Range   RPR Ser Ql REACTIVE (A) NON-REACTI  HIV Antibody (routine testing w rflx)     Status: None   Collection Time:  09/25/19 10:14 AM  Result Value Ref Range   HIV 1&2 Ab, 4th Generation NON-REACTIVE NON-REACTI    Comment: HIV-1 antigen and HIV-1/HIV-2 antibodies were not detected. There is no laboratory evidence of HIV infection. Marland Kitchen PLEASE NOTE: This information has been disclosed to you from records whose confidentiality may be protected by state law.  If your state requires such protection, then the state law prohibits you from making any further disclosure of the information without the specific written consent of the person to whom it pertains, or as otherwise permitted by law. A general authorization for the release of medical or other information is NOT sufficient for this purpose. . For additional information please refer to http://education.[facility].com/faq/FAQ106 (This link is being provided for informational/ educational purposes only.) . Marland Kitchen The performance of this assay has not been clinically validated in patients less than 72 years old. .   Vitamin B12     Status: None   Collection Time: 09/25/19  10:14 AM  Result Value Ref Range   Vitamin B-12 371 200 - 1,100 pg/mL    Comment: . Please Note: Although the reference range for vitamin B12 is [phone] pg/mL, it has been reported that between 5 and 10% of patients with values between 200 and 400 pg/mL may experience neuropsychiatric and hematologic abnormalities due to occult B12 deficiency; less than 1% of patients with values above 400 pg/mL will have symptoms. Marland Kitchen.   Rpr titer     Status: Abnormal   Collection Time: 09/25/19 10:14 AM  Result Value Ref Range   RPR Titer 1:1 (H)   Fluorescent treponemal ab(fta)-IgG-bld     Status: None   Collection Time: 09/25/19 10:14 AM  Result Value Ref Range   Fluorescent Treponemal ABS NON-REACTIVE NON-REACTI  Surgical pathology     Status: None   Collection Time: 10/18/19  3:22 PM  Result Value Ref Range   SURGICAL PATHOLOGY      SURGICAL PATHOLOGY CASE:  [id number] PATIENT: Gladys DammeARIA Rinkenberger Surgical Pathology Report     Clinical History: ASC-H (cm)     FINAL MICROSCOPIC DIAGNOSIS:  A. ENDOCERVIX, CURETTAGE: -  Low grade squamous intraepithelial lesion (CIN1, mild dysplasia)  B. CERVIX, 6 O'CLOCK, BIOPSY: -  High grade squamous intraepithelial lesion (CIN 2; moderate dysplasia)  C. CERVIX, 12 O'CLOCK, BIOPSY: -  Benign squamous epithelium -  No transitional epithelium identified   GROSS DESCRIPTION:  A. Received in formalin are tan, hemorrhagic soft tissue fragments that are entirely submitted. Volume: 1.5 x 1.2 x 0.2 cm. (1 B)  B.  Received in formalin is a tan, soft tissue fragment that is submitted in toto.  Size: 0.4 cm, 1 block submitted.  C.  Received in formalin are tan, soft tissue fragments that are submitted in toto. Number: Few fragments with mucoid material size: Average 0.3 cm blocks: 1 (AK 10/19/2019)    Final Diagnosis performed by Manning Charityawn Butler, MD.   Electr onically signed 10/20/2019 Technical and / or Professional components performed at [facility], [address], [place], [place] [address].  Immunohistochemistry Technical component (if applicable) was performed at [facility]. [address], [place], [place] [address].   IMMUNOHISTOCHEMISTRY DISCLAIMER (if applicable): Some of these immunohistochemical stains may have been developed and the performance characteristics determine by [facility]. Some may not have been cleared or approved by the U.S. Food and Drug Administration. The FDA has determined that such clearance or approval is not necessary. This test is used for clinical purposes. It should not be regarded as investigational or for research. This laboratory is certified under the Clinical Laboratory Improvement Amendments of 1988 (CLIA-88) as qualified to perform high complexity clinical laboratory testing.  The controls stained  appropriately.      PHQ2/9: Depression screen [facility] 2/9 11/30/2019 09/25/2019 03/27/2019 03/06/2019 09/22/2018  Decreased Interest 1 0 0 1 0  Down, Depressed, Hopeless 2 0 1 1 1   PHQ - 2 Score 3 0 1 2 1   Altered sleeping 2 0 0 0 2  Tired, decreased energy 2 1 1 1 2   Change in appetite 0 0 1 0 1  Feeling bad or failure about yourself  0 0 0 0 0  Trouble concentrating 0 1 0 0 0  Moving slowly or fidgety/restless 0 0 0 0 0  Suicidal thoughts 0 0 0 0 0  PHQ-9 Score 7 2 3 3 6   Difficult doing work/chores Somewhat difficult Not difficult at all Not  difficult at all Not difficult at all Not difficult at all  Some recent data might be hidden    phq 9 is positive   Fall Risk: Fall Risk  11/30/2019 09/25/2019 03/27/2019 03/06/2019 09/22/2018  Falls in the past year? 0 1 0 0 0  Number falls in past yr: 0 0 0 0 0  Injury with Fall? 0 0 0 0 0     Functional Status Survey: Is the patient deaf or have difficulty hearing?: No Does the patient have difficulty seeing, even when wearing glasses/contacts?: No Does the patient have difficulty concentrating, remembering, or making decisions?: No Does the patient have difficulty walking or climbing stairs?: No Does the patient have difficulty dressing or bathing?: No Does the patient have difficulty doing errands alone such as visiting a doctor's office or shopping?: No    Assessment & Plan   1. Hypertension, benign  At goal   2. Class 2 severe obesity with serious comorbidity and body mass index (BMI) of 38.0 to 38.9 in adult, unspecified obesity type (HCC)  Continue diet and physical activity   3. Moderate recurrent major depression (HCC)  Continue follow up with therapist  - DULoxetine HCl 30 MG CSDR; Take 1-2 capsules by mouth in the morning. Start with one , after first week take 2 daily and follow up  Dispense: 60 capsule; Refill: 0  4. Prediabetes   5. Need for immunization against influenza  - Flu Vaccine QUAD 36+ mos IM  6.  Tinea pedis of both feet  - KETOCONAZOLE, BULK, POWD; 1 mg by Does not apply route in the morning and at bedtime.  Dispense: 1000 g; Refill: 0  7. Mild intermittent asthma, uncomplicated  Continue medications  8. Chronic constipation  Refill miralax   9. Insulin resistance   10. High grade squamous intraepithelial lesion (HGSIL) on cytologic smear of cervix  Under the care of gyn

## 2019-11-30 ENCOUNTER — Other Ambulatory Visit: Payer: Self-pay

## 2019-11-30 ENCOUNTER — Encounter: Payer: Self-pay | Admitting: Family Medicine

## 2019-11-30 ENCOUNTER — Ambulatory Visit: Payer: Federal, State, Local not specified - PPO | Admitting: Family Medicine

## 2019-11-30 VITALS — BP 130/84 | HR 75 | Temp 98.0°F | Resp 16 | Ht 67.0 in | Wt 248.9 lb

## 2019-11-30 DIAGNOSIS — F331 Major depressive disorder, recurrent, moderate: Secondary | ICD-10-CM | POA: Diagnosis not present

## 2019-11-30 DIAGNOSIS — Z23 Encounter for immunization: Secondary | ICD-10-CM | POA: Diagnosis not present

## 2019-11-30 DIAGNOSIS — B356 Tinea cruris: Secondary | ICD-10-CM

## 2019-11-30 DIAGNOSIS — R7303 Prediabetes: Secondary | ICD-10-CM

## 2019-11-30 DIAGNOSIS — B353 Tinea pedis: Secondary | ICD-10-CM

## 2019-11-30 DIAGNOSIS — E88819 Insulin resistance, unspecified: Secondary | ICD-10-CM

## 2019-11-30 DIAGNOSIS — K5909 Other constipation: Secondary | ICD-10-CM

## 2019-11-30 DIAGNOSIS — I10 Essential (primary) hypertension: Secondary | ICD-10-CM | POA: Diagnosis not present

## 2019-11-30 DIAGNOSIS — Z6838 Body mass index (BMI) 38.0-38.9, adult: Secondary | ICD-10-CM

## 2019-11-30 DIAGNOSIS — E8881 Metabolic syndrome: Secondary | ICD-10-CM

## 2019-11-30 DIAGNOSIS — J4520 Mild intermittent asthma, uncomplicated: Secondary | ICD-10-CM

## 2019-11-30 DIAGNOSIS — A6004 Herpesviral vulvovaginitis: Secondary | ICD-10-CM

## 2019-11-30 DIAGNOSIS — R87613 High grade squamous intraepithelial lesion on cytologic smear of cervix (HGSIL): Secondary | ICD-10-CM

## 2019-11-30 MED ORDER — VALACYCLOVIR HCL 500 MG PO TABS: 500.0000 mg | ORAL_TABLET | Freq: Every day | ORAL | 1 refills | Status: DC

## 2019-11-30 MED ORDER — POLYETHYLENE GLYCOL 3350 17 GM/SCOOP PO POWD: 17.0000 g | Freq: Two times a day (BID) | ORAL | 1 refills | Status: DC | PRN

## 2019-11-30 MED ORDER — DULOXETINE HCL 30 MG PO CSDR: 1.0000 | DELAYED_RELEASE_CAPSULE | Freq: Every morning | ORAL | 0 refills | Status: DC

## 2019-11-30 MED ORDER — KETOCONAZOLE POWD: 1.0000 mg | Freq: Two times a day (BID) | 0 refills | Status: DC

## 2019-11-30 MED ORDER — ATENOLOL 25 MG PO TABS: 25.0000 mg | ORAL_TABLET | Freq: Every day | ORAL | 1 refills | Status: DC

## 2019-11-30 MED ORDER — KETOCONAZOLE 2 % EX CREA: 1.0000 "application " | TOPICAL_CREAM | Freq: Every day | CUTANEOUS | 0 refills | Status: DC

## 2019-12-01 ENCOUNTER — Other Ambulatory Visit: Payer: Self-pay

## 2019-12-01 MED ORDER — NYSTATIN 100000 UNIT/GM EX POWD
1.0000 | Freq: Three times a day (TID) | CUTANEOUS | 3 refills | Status: DC
Start: 2019-12-01 — End: 2021-04-23

## 2019-12-05 ENCOUNTER — Other Ambulatory Visit: Payer: Self-pay

## 2019-12-05 ENCOUNTER — Encounter: Payer: Self-pay | Admitting: Obstetrics & Gynecology

## 2019-12-05 ENCOUNTER — Ambulatory Visit (INDEPENDENT_AMBULATORY_CARE_PROVIDER_SITE_OTHER): Payer: Federal, State, Local not specified - PPO | Admitting: Obstetrics & Gynecology

## 2019-12-05 VITALS — BP 130/80 | Ht 67.0 in | Wt 246.0 lb

## 2019-12-05 DIAGNOSIS — N871 Moderate cervical dysplasia: Secondary | ICD-10-CM

## 2019-12-05 NOTE — Progress Notes (Signed)
   GYNECOLOGY CLINIC PROCEDURE NOTE  Cryotherapy details  Indication: Pt has a history of CIN 2. Most recent PAP revealed ASC-H.  The indications for cryotherapy were reviewed with the patient in detail. She was counseled about that efficacy of this procedure, and possible need for excisional procedure in the future if her cervical dysplasia persists.  The risks of the procedure where explained in detail and patient was told to expect a copious amount of discharge in the next few weeks. All her questions were answered, and written informed consent was obtained.  The patient was placed in the dorsal lithotomy position and a vaginal speculum was placed. Her cervix was visualized and patient was noted to have had normal size transformation zone. The appropriate cryotherapy probes were picked and the more endocervical probe was then affixed to cryotherapy apparatus. Then, nitrogen gas was then activated, the probe was applied to the transformation zone of the cervix. This was kept in place for 2 minutes. The cryotherapy was then stopped and all instruments were removed from the patient's pelvis; a thawing period of 2 minutes was observed.  A second cycle of cryotherapy was then administered to the cervix with the more ectocervical probe for 2 minutes.  Then, the probe was removed.  The patient tolerated the procedure well without any complications. Routine post procedure instructions were given to the patient.  Will repeat pap smear in 3 months and manage accordingly.  Annamarie Major, MD, [facility], [facility] 12/05/2019  11:02 AM

## 2019-12-05 NOTE — Patient Instructions (Signed)
Cryoablation Cryoablation is a procedure used to remove abnormal growths or cancerous tissue. This is done by freezing the growth or tissue with either liquid nitrogen or argon gas. This procedure may be done to treat many conditions, including:  Skin tumors.  Non-cancerous (benign) nodules.  A type of eye cancer (retinoblastoma).  Cancers of the prostate, liver, kidney, cervix, lung, and bone. Tell a health care provider about:  Any allergies you have.  All medicines you are taking, including vitamins, herbs, eye drops, creams, and over-the-counter medicines.  Any problems you or family members have had with anesthetic medicines.  Any blood disorders you have.  Any surgeries you have had.  Any medical conditions you have.  Whether you are pregnant or may be pregnant. What are the risks? Generally, this is a safe procedure. However, problems may occur, including:  Infection.  Bleeding.  Swelling.  Nerve damage and loss of feeling. This is rare.  Allergic reactions to medicines.  Damage to other structures or organs. What happens before the procedure? Staying hydrated Follow instructions from your health care provider about hydration, which may include:  Up to 2 hours before the procedure - you may continue to drink clear liquids, such as water, clear fruit juice, black coffee, and plain tea. Eating and drinking restrictions Follow instructions from your health care provider about eating and drinking, which may include:  8 hours before the procedure - stop eating heavy meals or foods such as meat, fried foods, or fatty foods.  6 hours before the procedure - stop eating light meals or foods, such as toast or cereal.  6 hours before the procedure - stop drinking milk or drinks that contain milk.  2 hours before the procedure - stop drinking clear liquids. Medicines  Ask your health care provider about: ? Changing or stopping your regular medicines. This is  especially important if you are taking diabetes medicines or blood thinners. ? Taking medicines such as aspirin and ibuprofen. These medicines can thin your blood. Do not take these medicines before your procedure if your health care provider instructs you not to.  You may be given antibiotic medicine to help prevent infection. General instructions  You may have blood tests.  Plan to have someone take you home from the hospital or clinic.  If you will be going home right after the procedure, plan to have someone with you for 24 hours.  Ask your health care provider how your surgical site will be marked or identified. What happens during the procedure?  To reduce your risk of infection: ? Your health care team will wash or sanitize their hands. ? Your skin will be washed with soap. ? Hair may be removed from the surgical area.  An IV tube will be inserted into one of your veins.  You will be given one or more of the following: ? A medicine to help you relax (sedative). ? A medicine to numb the area (local anesthetic). ? A medicine to make you fall asleep (general anesthetic). ? A medicine that is injected into your spine to numb the area below and slightly above the injection site (spinal anesthetic). ? A medicine that is injected into an area of your body to numb everything below the injection site (regional anesthetic).  Depending on the location of the growth, a small incision may be made. A bronchoscope may be used for a lung tumor, or a laparoscope may be used for an abdominal tumor.  Your health care provider may   use a device (cryoprobe) that has liquid nitrogen or argon gas flowing through it. The cryoprobe will be inserted through the incision to the area where the tumor is located. This may be done with guidance from imaging such as an ultrasound, CT scan, or MRI scan.  Liquid nitrogen or argon gas will be delivered through the cryoprobe to the growth until it is frozen and  destroyed.  The process may be repeated on other areas depending on how many areas need treatment.  The cryoprobe will be removed and pressure will be applied to stop any bleeding.  The incision will be closed with stitches (sutures).  The incision will be covered with a bandage (dressing). The procedure will vary depending on the location of the tumor or nodule. The procedure may also vary among health care providers and hospitals. What happens after the procedure?  Do not drive for 24 hours if you received a sedative.  Your blood pressure, heart rate, breathing rate, and blood oxygen level will be monitored until the medicines you were given have worn off.  You will be given medicine to help with pain, nausea, and vomiting as needed. This information is not intended to replace advice given to you by your health care provider. Make sure you discuss any questions you have with your health care provider. Document Revised: 03/05/2017 Document Reviewed: 08/21/2015 Elsevier Patient Education  2020 [employer]vier Inc[employer]  

## 2019-12-26 ENCOUNTER — Other Ambulatory Visit: Payer: Self-pay | Admitting: Family Medicine

## 2019-12-26 DIAGNOSIS — F331 Major depressive disorder, recurrent, moderate: Secondary | ICD-10-CM

## 2020-01-02 ENCOUNTER — Encounter: Payer: Self-pay | Admitting: Family Medicine

## 2020-01-16 ENCOUNTER — Other Ambulatory Visit: Payer: Self-pay

## 2020-01-16 ENCOUNTER — Ambulatory Visit: Payer: Federal, State, Local not specified - PPO | Admitting: Family Medicine

## 2020-01-16 ENCOUNTER — Encounter: Payer: Self-pay | Admitting: Family Medicine

## 2020-01-16 VITALS — BP 128/72 | HR 71 | Temp 98.1°F | Resp 14 | Ht 67.0 in | Wt 244.5 lb

## 2020-01-16 DIAGNOSIS — Z6838 Body mass index (BMI) 38.0-38.9, adult: Secondary | ICD-10-CM

## 2020-01-16 DIAGNOSIS — F331 Major depressive disorder, recurrent, moderate: Secondary | ICD-10-CM

## 2020-01-16 MED ORDER — DULOXETINE HCL 40 MG PO CPEP: 40.0000 mg | ORAL_CAPSULE | Freq: Every day | ORAL | 1 refills | Status: DC

## 2020-01-16 NOTE — Progress Notes (Signed)
Name: Rachel Dunlap   MRN: [id number]    DOB: April 06, 1996   Date:01/16/2020       Progress Note  Subjective  Chief Complaint  Follow up  HPI    Obesity:She could not afford Belviq or Ozempic. Shelost 7 lbs since last visit, she is eating healthy, portion control, drinking more water and going to the gym with her friend - initially 5 days a week but now not as often but will resume .   Depression Major: she is back in therapy with [facility] because she recently went on vacation to [place] with all her family this past Summer  and they noticed that she snappy which is not her normal behavior and she realized she needed help again. She was taking Wellbutrin XL 150mg , she still seeing the therapist and is now taking Duloxetine only able to tolerate 30 mg daily , she states when she went up on the dose she had more hot flashes and nausea. Discussed trying going up to 45 mg for a couple of weeks and go up to 60 mg after that . She states medication has helped with her mood, she is excited about getting a . 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     Patient Active Problem List   Diagnosis Date Noted  . Eczema 08/31/2018  . Insulin resistance 05/16/2018  . Prediabetes 02/14/2018  . Essential hypertension 02/14/2018  . Moderate recurrent major depression (HCC) 02/16/2017  . Back pain due to injury 09/18/2016  . Acne vulgaris 09/16/2015  . Hypertrichosis 09/16/2015  . Chronic constipation 11/09/2014  . Chronic dermatitis 11/09/2014  . Dysmetabolic syndrome 11/09/2014  . Hypertension, benign 11/09/2014  . Genital herpes in women 11/09/2014  . Mild intermittent asthma 11/09/2014  . Allergic rhinitis, seasonal 11/09/2014  . Class 2 severe obesity with serious comorbidity and body mass index (BMI) of 38.0 to 38.9 in  adult (HCC) 11/09/2014  . IBS (irritable bowel syndrome) 11/09/2014  . Bilateral polycystic ovarian syndrome 11/09/2014  . Abnormal presence of protein in urine 11/09/2014  . Vitamin D deficiency 11/09/2014  . Anxiety 11/09/2014    History reviewed. No pertinent surgical history.  Family History  Problem Relation Age of Onset  . Hypertension Mother   . Obesity Mother   . Cancer Father   . Diabetes Father   . Kidney disease Father   . Obesity Maternal Grandmother     Social History   Tobacco Use  . Smoking status: Never Smoker  . Smokeless tobacco: Never Used  Substance Use Topics  . Alcohol use: Yes    Alcohol/week: 0.0 standard drinks    Comment: seldom     Current Outpatient Medications:  .  albuterol (VENTOLIN HFA) 108 (90 Base) MCG/ACT inhaler, Inhale 2 puffs into the lungs every 6 (six) hours as needed for wheezing or shortness of breath., Disp: 6.7 Inhaler, Rfl: 0 .  atenolol (TENORMIN) 25 MG tablet, Take 1 tablet (25 mg total) by mouth daily., Disp: 90 tablet, Rfl: 1 .  Blood Pressure Monitoring (OMRON 5 SERIES BP MONITOR) DEVI, , Disp: , Rfl:  .  desogestrel-ethinyl estradiol (APRI) 0.15-30 MG-MCG tablet, Take 1 tablet by mouth daily., Disp: 84 tablet, Rfl: 3 .  desoximetasone (TOPICORT) 0.25 % cream, Apply 1 application topically 2 (two) times daily., Disp: 100 g, Rfl: 0 .  DULoxetine (CYMBALTA) 30 MG capsule, TAKE 1-2 CAPSULES BY MOUTH IN THE MORNING. START WITH ONE , AFTER FIRST WEEK TAKE 2 DAILY AND FOLLOW UP, Disp: 180 capsule, Rfl: 0 .  enalapril (VASOTEC) 5 MG tablet, Take 1 tablet by mouth daily., Disp: , Rfl:  .  fluticasone (FLONASE) 50 MCG/ACT nasal spray, Place 2 sprays into both nostrils at bedtime., Disp: 48 g, Rfl: 1 .  ketoconazole (NIZORAL) 2 % cream, Apply 1 application topically at bedtime., Disp: 60 g, Rfl: 0 .  KETOCONAZOLE, BULK, POWD, 1 mg by Does not apply route in the morning and at bedtime., Disp: 1000 g, Rfl: 0 .  loratadine (CLARITIN) 10  MG tablet, TAKE 1 TABLET BY MOUTH EVERY DAY, Disp: 90 tablet, Rfl: 2 .  montelukast (SINGULAIR) 10 MG tablet, TAKE 1 TABLET BY MOUTH EVERYDAY AT BEDTIME, Disp: 90 tablet, Rfl: 2 .  nystatin (MYCOSTATIN/NYSTOP) powder, Apply 1 application topically 3 (three) times daily., Disp: 30 g, Rfl: 3 .  polyethylene glycol powder (GLYCOLAX/MIRALAX) 17 GM/SCOOP powder, Take 17 g by mouth 2 (two) times daily as needed., Disp: 3350 g, Rfl: 1 .  triamcinolone cream (KENALOG) 0.1 %, Apply 1 application topically 2 (two) times daily., Disp: 45 g, Rfl: 0 .  valACYclovir (VALTREX) 500 MG tablet, Take 1 tablet (500 mg total) by mouth daily. And twice daily on outbreaks, Disp: 115 tablet, Rfl: 1  No Known Allergies  I personally reviewed active problem list, medication list, allergies, family history, social history with the patient/caregiver today.   ROS  Ten systems reviewed and is negative except as mentioned in HPI   Objective  Vitals:   01/16/20 1351  BP: 128/72  Pulse: 71  Resp: 14  Temp: 98.1 F (36.7 C)  TempSrc: Oral  SpO2: 100%  Weight: 244 lb 8 oz (110.9 kg)  Height: 5\' 7"  (1.702 m)    Body mass index is 38.29 kg/m.  Physical Exam  Constitutional: Patient appears well-developed and well-nourished. Obese  No distress.  HEENT: head atraumatic, normocephalic, pupils equal and reactive to light, neck supple Cardiovascular: Normal rate, regular rhythm and normal heart sounds.  No murmur heard. No BLE edema. Pulmonary/Chest: Effort normal and breath sounds normal. No respiratory distress. Abdominal: Soft.  There is no tenderness. Psychiatric: Patient has a normal mood and affect. behavior is normal. Judgment and thought content normal.   Recent Results (from the past 2160 hour(s))  Surgical pathology     Status: None   Collection Time: 10/18/19  3:22 PM  Result Value Ref Range   SURGICAL PATHOLOGY      SURGICAL PATHOLOGY CASE: [id number] PATIENT: 05-17-1973 Surgical  Pathology Report     Clinical History: ASC-H (cm)     FINAL MICROSCOPIC DIAGNOSIS:  A. ENDOCERVIX, CURETTAGE: -  Low grade squamous intraepithelial lesion (CIN1, mild dysplasia)  B. CERVIX, 6 O'CLOCK, BIOPSY: -  High grade squamous intraepithelial lesion (CIN 2; moderate dysplasia)  C. CERVIX, 12 O'CLOCK, BIOPSY: -  Benign squamous epithelium -  No transitional epithelium identified   GROSS DESCRIPTION:  A. Received in formalin are tan, hemorrhagic soft tissue fragments that are entirely submitted. Volume: 1.5 x 1.2 x 0.2 cm. (1 B)  B.  Received in formalin is a tan, soft tissue fragment that is submitted in toto.  Size: 0.4 cm, 1 block submitted.  C.  Received in formalin are tan, soft tissue fragments that are submitted in toto. Number: Few fragments with mucoid material size: Average 0.3 cm blocks: 1 (AK  10/19/2019)    Final Diagnosis performed by Manning Charity, MD.   Electr onically signed 10/20/2019 Technical and / or Professional components performed at [employer], [address], [place], [place] [address].  Immunohistochemistry Technical component (if applicable) was performed at [facility]. [address], [place], [place] [address].   IMMUNOHISTOCHEMISTRY DISCLAIMER (if applicable): Some of these immunohistochemical stains may have been developed and the performance characteristics determine by [facility]. Some may not have been cleared or approved by the U.S. Food and Drug Administration. The FDA has determined that such clearance or approval is not necessary. This test is used for clinical purposes. It should not be regarded as investigational or for research. This laboratory is certified under the Clinical Laboratory Improvement Amendments of 1988 (CLIA-88) as qualified to perform high complexity clinical laboratory testing.  The controls stained appropriately.       PHQ2/9: Depression  screen [facility] 2/9 01/16/2020 11/30/2019 09/25/2019 03/27/2019 03/06/2019  Decreased Interest 0 1 0 0 1  Down, Depressed, Hopeless 0 2 0 1 1  PHQ - 2 Score 0 3 0 1 2  Altered sleeping 0 2 0 0 0  Tired, decreased energy 0 2 1 1 1   Change in appetite 0 0 0 1 0  Feeling bad or failure about yourself  0 0 0 0 0  Trouble concentrating 0 0 1 0 0  Moving slowly or fidgety/restless 0 0 0 0 0  Suicidal thoughts 0 0 0 0 0  PHQ-9 Score 0 7 2 3 3   Difficult doing work/chores - Somewhat difficult Not difficult at all Not difficult at all Not difficult at all  Some recent data might be hidden    phq 9 is negative   Fall Risk: Fall Risk  01/16/2020 11/30/2019 09/25/2019 03/27/2019 03/06/2019  Falls in the past year? 0 0 1 0 0  Number falls in past yr: 0 0 0 0 0  Injury with Fall? 0 0 0 0 0      Functional Status Survey: Is the patient deaf or have difficulty hearing?: No Does the patient have difficulty seeing, even when wearing glasses/contacts?: No Does the patient have difficulty concentrating, remembering, or making decisions?: No Does the patient have difficulty walking or climbing stairs?: No Does the patient have difficulty dressing or bathing?: No Does the patient have difficulty doing errands alone such as visiting a doctor's office or shopping?: No    Assessment & Plan  1. Moderate recurrent major depression (HCC)  We will try to go up to 40 mg but if cost goes up we will change to 60 mg again  - DULoxetine 40 MG CPEP; Take 40 mg by mouth daily.  Dispense: 30 capsule; Refill: 1  2. Class 2 severe obesity with serious comorbidity and body mass index (BMI) of 38.0 to 38.9 in adult, unspecified obesity type [facility])  Discussed with the patient the risk posed by an increased BMI. Discussed importance of portion control, calorie counting and at least 150 minutes of physical activity weekly. Avoid sweet beverages and drink more water. Eat at least 6 servings of fruit and vegetables daily

## 2020-02-01 ENCOUNTER — Encounter: Payer: Self-pay | Admitting: Family Medicine

## 2020-02-01 ENCOUNTER — Other Ambulatory Visit: Payer: Self-pay | Admitting: Family Medicine

## 2020-02-01 DIAGNOSIS — B356 Tinea cruris: Secondary | ICD-10-CM

## 2020-02-02 ENCOUNTER — Other Ambulatory Visit: Payer: Self-pay

## 2020-02-02 DIAGNOSIS — J4520 Mild intermittent asthma, uncomplicated: Secondary | ICD-10-CM

## 2020-02-02 MED ORDER — MONTELUKAST SODIUM 10 MG PO TABS: ORAL_TABLET | ORAL | 2 refills | Status: DC

## 2020-02-15 ENCOUNTER — Other Ambulatory Visit: Payer: Self-pay | Admitting: Family Medicine

## 2020-02-15 DIAGNOSIS — F331 Major depressive disorder, recurrent, moderate: Secondary | ICD-10-CM

## 2020-02-15 NOTE — Telephone Encounter (Signed)
  Notes to clinic:  Patient requesting a 90 day supply  Please review for a change    Requested Prescriptions  Pending Prescriptions Disp Refills   DULoxetine HCl 40 MG CPEP [Pharmacy Med Name: DULOXETINE HCL DR 40 MG CAP] 90 capsule 1    Sig: TAKE 1 CAPSULE BY MOUTH EVERY DAY      Psychiatry: Antidepressants - SNRI Passed - 02/15/2020 12:32 PM      Passed - Completed PHQ-2 or PHQ-9 in the last 360 days      Passed - Last BP in normal range    BP Readings from Last 1 Encounters:  01/16/20 128/72          Passed - Valid encounter within last 6 months    Recent Outpatient Visits           1 month ago Class 2 severe obesity with serious comorbidity and body mass index (BMI) of 38.0 to 38.9 in adult, unspecified obesity type [facility])   [facility] Alba Cory, MD   2 months ago Hypertension, benign   [facility] Alba Cory, MD   4 months ago Well adult exam   [facility] Alba Cory, MD   10 months ago Boil of trunk   [facility] [place], Danna Hefty, MD   11 months ago Moderate recurrent major depression [facility]   [facility] Alba Cory, MD       Future Appointments             In 5 days Alba Cory, MD [facility]

## 2020-02-19 NOTE — Progress Notes (Signed)
Name: Rachel Dunlap   MRN: [id number]    DOB: 04-02-1996   Date:02/20/2020       Progress Note  Subjective  Chief Complaint  Follow up  HPI   Obesity:She could not afford Belviq or Ozempic. Shelost another 9  lbs since last visit one month ago , she is eating healthy, portion control, drinking more water and has been more active at work. She is also feeling better emotionally.   Depression Major: she is back in therapy with [facility] because during a family vacation to  [place] this past Summer, her family members  noticed that she snappy which is not her normal behavior and she realized she needed help again. She was taking Wellbutrin XL 150 but is currently on Duloxetine 40 mg and is tolerating medication and seems to be helping her mood. She is excited about getting a Education officer, environmental. 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  HTN: bp is at goal today shedenies sob or chest pain. Also sees Dr. Ronn Melena and is taking vasotec because of microalbuminuria. She is aware of teratogenic effects of ACEShe is also on AtenololCompliant with medication , she now has a monitor at home and bp has been in the 120's-130's level.   Pre-diabetes: she denies polyphagia, polydipsia or polyuria.Fasting insulin elevated, she has been physically active and eating healthy, she states her mother has the same insurance and they are covering her Ozempic and she would like to try it again   AR: doing well at this time, taking medications, no rhinorrhea or congestion. Taking singulair and loratadine. Unchanged   Asthma: she is taking singulair daily and has not needed to use rescue inhaler, denies wheezing, cough or SOB.Last flare was over one year ago   Genital herpes: she takes valtrex daily, she states last outbreak was years ago when she was  first diagnosed. Same   Constipation: long history of constipation,currently using Miralax and has been working well as long as she takes it daily . No straining or blood in stools. She has been water , she states Bristol scale of 4   Rash feet: she states both of her feet have dry skin, not itchy, she states topical medication helped    Patient Active Problem List   Diagnosis Date Noted  . Eczema 08/31/2018  . Insulin resistance 05/16/2018  . Prediabetes 02/14/2018  . Essential hypertension 02/14/2018  . Moderate recurrent major depression (HCC) 02/16/2017  . Back pain due to injury 09/18/2016  . Acne vulgaris 09/16/2015  . Hypertrichosis 09/16/2015  . Chronic constipation 11/09/2014  . Chronic dermatitis 11/09/2014  . Dysmetabolic syndrome 11/09/2014  . Hypertension, benign 11/09/2014  . Genital herpes in women 11/09/2014  . Mild intermittent asthma 11/09/2014  . Allergic rhinitis, seasonal 11/09/2014  . Class 2 severe obesity with serious comorbidity and body mass index (BMI) of 38.0 to 38.9 in adult (HCC) 11/09/2014  . IBS (irritable bowel syndrome) 11/09/2014  . Bilateral polycystic ovarian syndrome 11/09/2014  . Abnormal presence of protein in urine 11/09/2014  . Vitamin D deficiency 11/09/2014  . Anxiety 11/09/2014    History reviewed. No pertinent surgical history.  Family History  Problem Relation Age of Onset  . Hypertension Mother   . Obesity Mother   . Cancer Father   . Diabetes Father   . Kidney disease Father   .  Obesity Maternal Grandmother     Social History   Tobacco Use  . Smoking status: Never Smoker  . Smokeless tobacco: Never Used  Substance Use Topics  . Alcohol use: Yes    Alcohol/week: 0.0 standard drinks    Comment: seldom     Current Outpatient Medications:  .  albuterol (VENTOLIN HFA) 108 (90 Base) MCG/ACT inhaler, Inhale 2 puffs into the lungs every 6 (six) hours as needed for wheezing or shortness of breath., Disp: 6.7 Inhaler,  Rfl: 0 .  atenolol (TENORMIN) 25 MG tablet, Take 1 tablet (25 mg total) by mouth daily., Disp: 90 tablet, Rfl: 1 .  Blood Pressure Monitoring (OMRON 5 SERIES BP MONITOR) DEVI, , Disp: , Rfl:  .  desogestrel-ethinyl estradiol (APRI) 0.15-30 MG-MCG tablet, Take 1 tablet by mouth daily., Disp: 84 tablet, Rfl: 3 .  desoximetasone (TOPICORT) 0.25 % cream, Apply 1 application topically 2 (two) times daily., Disp: 100 g, Rfl: 0 .  DULoxetine 40 MG CPEP, Take 40 mg by mouth daily., Disp: 30 capsule, Rfl: 1 .  enalapril (VASOTEC) 5 MG tablet, Take 1 tablet by mouth daily., Disp: , Rfl:  .  fluticasone (FLONASE) 50 MCG/ACT nasal spray, Place 2 sprays into both nostrils at bedtime., Disp: 48 g, Rfl: 1 .  ketoconazole (NIZORAL) 2 % cream, APPLY 1 APPLICATION TOPICALLY AT BEDTIME., Disp: 60 g, Rfl: 0 .  KETOCONAZOLE, BULK, POWD, 1 mg by Does not apply route in the morning and at bedtime., Disp: 1000 g, Rfl: 0 .  loratadine (CLARITIN) 10 MG tablet, TAKE 1 TABLET BY MOUTH EVERY DAY, Disp: 90 tablet, Rfl: 2 .  montelukast (SINGULAIR) 10 MG tablet, TAKE 1 TABLET BY MOUTH EVERYDAY AT BEDTIME, Disp: 90 tablet, Rfl: 2 .  nystatin (MYCOSTATIN/NYSTOP) powder, Apply 1 application topically 3 (three) times daily., Disp: 30 g, Rfl: 3 .  polyethylene glycol powder (GLYCOLAX/MIRALAX) 17 GM/SCOOP powder, Take 17 g by mouth 2 (two) times daily as needed., Disp: 3350 g, Rfl: 1 .  triamcinolone cream (KENALOG) 0.1 %, Apply 1 application topically 2 (two) times daily., Disp: 45 g, Rfl: 0 .  valACYclovir (VALTREX) 500 MG tablet, Take 1 tablet (500 mg total) by mouth daily. And twice daily on outbreaks, Disp: 115 tablet, Rfl: 1  No Known Allergies  I personally reviewed active problem list, medication list, allergies, family history, social history, health maintenance with the patient/caregiver today.   ROS  Constitutional: Negative for fever , positive for weight change.  Respiratory: Negative for cough and shortness of  breath.   Cardiovascular: Negative for chest pain or palpitations.  Gastrointestinal: Negative for abdominal pain, no bowel changes.  Musculoskeletal: Negative for gait problem or joint swelling.  Skin: Negative for rash.  Neurological: Negative for dizziness or headache.  No other specific complaints in a complete review of systems (except as listed in HPI above).   Objective  Vitals:   02/20/20 1451  BP: 130/82  Pulse: 77  Resp: 18  Temp: 98 F (36.7 C)  TempSrc: Oral  SpO2: 99%  Weight: 236 lb 4.8 oz (107.2 kg)  Height: 5\' 7"  (1.702 m)    Body mass index is 37.01 kg/m.  Physical Exam  Constitutional: Patient appears well-developed and well-nourished. Obese  No distress.  HEENT: head atraumatic, normocephalic, pupils equal and reactive to light,  neck supple Cardiovascular: Normal rate, regular rhythm and normal heart sounds.  No murmur heard. No BLE edema. Pulmonary/Chest: Effort normal and breath sounds normal. No respiratory distress. Abdominal: Soft.  There is no tenderness. Psychiatric: Patient has a normal mood and affect. behavior is normal. Judgment and thought content normal.  PHQ2/9: Depression screen [facility] 2/9 02/20/2020 01/16/2020 11/30/2019 09/25/2019 03/27/2019  Decreased Interest 0 0 1 0 0  Down, Depressed, Hopeless 0 0 2 0 1  PHQ - 2 Score 0 0 3 0 1  Altered sleeping 0 0 2 0 0  Tired, decreased energy 0 0 2 1 1   Change in appetite 0 0 0 0 1  Feeling bad or failure about yourself  0 0 0 0 0  Trouble concentrating 0 0 0 1 0  Moving slowly or fidgety/restless 0 0 0 0 0  Suicidal thoughts 0 0 0 0 0  PHQ-9 Score 0 0 7 2 3   Difficult doing work/chores - - Somewhat difficult Not difficult at all Not difficult at all  Some recent data might be hidden    phq 9  is negative   Fall Risk: Fall Risk  02/20/2020 01/16/2020 11/30/2019 09/25/2019 03/27/2019  Falls in the past year? 0 0 0 1 0  Number falls in past yr: 0 0 0 0 0  Injury with Fall? 0 0 0 0 0       Assessment & Plan  1. Metabolic syndrome  We will try Ozempic  2. Class 2 severe obesity with serious comorbidity and body mass index (BMI) of 38.0 to 38.9 in adult, unspecified obesity type (HCC)  Continue life style modification   3. Hypertension, benign  Refill Atenolol   4. Mild recurrent major depression (HCC)   5. Insulin resistance  - Semaglutide,0.25 or 0.5MG /DOS, (OZEMPIC, 0.25 OR 0.5 MG/DOSE,) 2 MG/1.5ML SOPN; Inject 0.5 mg into the skin once a week.  Dispense: 1.5 mL; Refill: 2  6. Chronic constipation  Doing well on Miralax  7. Vitamin D deficiency   8. Tinea pedis of both feet  - terbinafine (LAMISIL) 250 MG tablet; Take 1 tablet (250 mg total) by mouth daily.  Dispense: 14 tablet; Refill: 0

## 2020-02-20 ENCOUNTER — Encounter: Payer: Self-pay | Admitting: Family Medicine

## 2020-02-20 ENCOUNTER — Other Ambulatory Visit: Payer: Self-pay

## 2020-02-20 ENCOUNTER — Ambulatory Visit: Payer: Federal, State, Local not specified - PPO | Admitting: Family Medicine

## 2020-02-20 VITALS — BP 130/82 | HR 77 | Temp 98.0°F | Resp 18 | Ht 67.0 in | Wt 236.3 lb

## 2020-02-20 DIAGNOSIS — I10 Essential (primary) hypertension: Secondary | ICD-10-CM | POA: Diagnosis not present

## 2020-02-20 DIAGNOSIS — Z6838 Body mass index (BMI) 38.0-38.9, adult: Secondary | ICD-10-CM

## 2020-02-20 DIAGNOSIS — E8881 Metabolic syndrome: Secondary | ICD-10-CM

## 2020-02-20 DIAGNOSIS — F330 Major depressive disorder, recurrent, mild: Secondary | ICD-10-CM | POA: Diagnosis not present

## 2020-02-20 DIAGNOSIS — K5909 Other constipation: Secondary | ICD-10-CM

## 2020-02-20 DIAGNOSIS — B353 Tinea pedis: Secondary | ICD-10-CM

## 2020-02-20 DIAGNOSIS — E559 Vitamin D deficiency, unspecified: Secondary | ICD-10-CM

## 2020-02-20 MED ORDER — OZEMPIC (0.25 OR 0.5 MG/DOSE) 2 MG/1.5ML ~~LOC~~ SOPN: 0.5000 mg | PEN_INJECTOR | SUBCUTANEOUS | 2 refills | Status: DC

## 2020-02-20 MED ORDER — ATENOLOL 25 MG PO TABS: 25.0000 mg | ORAL_TABLET | Freq: Every day | ORAL | 1 refills | Status: DC

## 2020-02-20 MED ORDER — TERBINAFINE HCL 250 MG PO TABS: 250.0000 mg | ORAL_TABLET | Freq: Every day | ORAL | 0 refills | Status: DC

## 2020-03-05 ENCOUNTER — Ambulatory Visit: Payer: Federal, State, Local not specified - PPO | Admitting: Obstetrics & Gynecology

## 2020-03-06 ENCOUNTER — Other Ambulatory Visit: Payer: Self-pay

## 2020-03-06 ENCOUNTER — Encounter: Payer: Self-pay | Admitting: Obstetrics & Gynecology

## 2020-03-06 ENCOUNTER — Ambulatory Visit (INDEPENDENT_AMBULATORY_CARE_PROVIDER_SITE_OTHER): Payer: Federal, State, Local not specified - PPO | Admitting: Obstetrics & Gynecology

## 2020-03-06 ENCOUNTER — Other Ambulatory Visit (HOSPITAL_COMMUNITY)
Admission: RE | Admit: 2020-03-06 | Discharge: 2020-03-06 | Disposition: A | Discharge status: Home / Self Care | Payer: Federal, State, Local not specified - PPO | Source: Ambulatory Visit | Attending: Obstetrics & Gynecology | Admitting: Obstetrics & Gynecology

## 2020-03-06 VITALS — BP 130/80 | Ht 67.0 in | Wt 232.0 lb

## 2020-03-06 DIAGNOSIS — N871 Moderate cervical dysplasia: Secondary | ICD-10-CM | POA: Diagnosis not present

## 2020-03-06 NOTE — Progress Notes (Signed)
HPI:  Patient is a 24 y.o. G0P0000 presenting for follow up evaluation of abnormal PAP smear in the past.  Her last PAP was 4 months ago and was abnormal: ASC-H. She has had a prior colposcopy. Prior biopsies (if done) were CIN II.  This was subsequently treated w CRYO to the cervix in August 2021.  PMHx: She  has a past medical history of Acne, Allergy, Anxiety, Asthma, Chronic constipation, Chronic dermatitis, Constipation, Depression, Dermatitis, Dysmetabolic syndrome, Genital herpes, Hypertension, Irritable bowel syndrome without diarrhea, Kidney disease, Morbid obesity with BMI of 40.0-44.9, adult (HCC), Panic disorder, PCOS (polycystic ovarian syndrome), Pre-diabetes, Proteinuria, Stomach ulcer, and Vitamin D deficiency. Also,  has no past surgical history on file., family history includes Cancer in her father; Diabetes in her father; Hypertension in her mother; Kidney disease in her father; Obesity in her maternal grandmother and mother.,  reports that she has never smoked. She has never used smokeless tobacco. She reports current alcohol use. She reports that she does not use drugs.  She has a current medication list which includes the following prescription(s): albuterol, atenolol, omron 5 series bp monitor, desogestrel-ethinyl estradiol, desoximetasone, duloxetine hcl, enalapril, fluticasone, ketoconazole, ketoconazole (bulk), loratadine, montelukast, nystatin, polyethylene glycol powder, ozempic (0.25 or 0.5 mg/dose), terbinafine, triamcinolone, and valacyclovir. Also, has No Known Allergies.  Review of Systems  Constitutional: Negative for chills, fever and malaise/fatigue.  HENT: Negative for congestion, sinus pain and sore throat.   Eyes: Negative for blurred vision and pain.  Respiratory: Negative for cough and wheezing.   Cardiovascular: Negative for chest pain and leg swelling.  Gastrointestinal: Negative for abdominal pain, constipation, diarrhea, heartburn, nausea and vomiting.    Genitourinary: Negative for dysuria, frequency, hematuria and urgency.  Musculoskeletal: Negative for back pain, joint pain, myalgias and neck pain.  Skin: Negative for itching and rash.  Neurological: Negative for dizziness, tremors and weakness.  Endo/Heme/Allergies: Does not bruise/bleed easily.  Psychiatric/Behavioral: Negative for depression. The patient is not nervous/anxious and does not have insomnia.     Objective: BP 130/80   Ht 5\' 7"  (1.702 m)   Wt 232 lb (105.2 kg)   LMP 03/01/2020   BMI 36.34 kg/m  Filed Weights   03/06/20 1407  Weight: 232 lb (105.2 kg)   Body mass index is 36.34 kg/m.  Physical examination Physical Exam Constitutional:      General: She is not in acute distress.    Appearance: She is well-developed.  Genitourinary:     Pelvic exam was performed with patient supine.     Vagina and uterus normal.     No vaginal erythema or bleeding.     No cervical motion tenderness, discharge, polyp or nabothian cyst.     Uterus is mobile.     Uterus is not enlarged.     No uterine mass detected.    Uterus is midaxial.     No right or left adnexal mass present.     Right adnexa not tender.     Left adnexa not tender.  HENT:     Head: Normocephalic and atraumatic.     Nose: Nose normal.  Abdominal:     General: There is no distension.     Palpations: Abdomen is soft.     Tenderness: There is no abdominal tenderness.  Musculoskeletal:        General: Normal range of motion.  Neurological:     Mental Status: She is alert and oriented to person, place, and time.  Cranial Nerves: No cranial nerve deficit.  Skin:    General: Skin is warm and dry.  Psychiatric:        Attention and Perception: Attention normal.        Mood and Affect: Mood and affect normal.        Speech: Speech normal.        Behavior: Behavior normal.        Thought Content: Thought content normal.        Judgment: Judgment normal.     ASSESSMENT:  History of Cervical  Dysplasia - CIN II  Plan:  1.  I discussed the grading system of pap smears and HPV high risk viral types.   2. Follow up PAP 6 months, vs intervention if high grade dysplasia identified.  A total of 20 minutes were spent face-to-face with the patient as well as preparation, review, communication, and documentation during this encounter.    Annamarie Major, MD, [facility], [facility] 03/06/2020  2:22 PM

## 2020-03-12 LAB — CYTOLOGY - PAP: Diagnosis: NEGATIVE

## 2020-03-20 ENCOUNTER — Other Ambulatory Visit: Payer: Self-pay | Admitting: Family Medicine

## 2020-03-20 DIAGNOSIS — F331 Major depressive disorder, recurrent, moderate: Secondary | ICD-10-CM

## 2020-04-19 ENCOUNTER — Ambulatory Visit (INDEPENDENT_AMBULATORY_CARE_PROVIDER_SITE_OTHER): Payer: 59 | Admitting: Obstetrics & Gynecology

## 2020-04-19 ENCOUNTER — Encounter: Payer: Self-pay | Admitting: Obstetrics & Gynecology

## 2020-04-19 ENCOUNTER — Other Ambulatory Visit: Payer: Self-pay

## 2020-04-19 VITALS — BP 124/60 | Ht 67.0 in | Wt 234.0 lb

## 2020-04-19 DIAGNOSIS — R399 Unspecified symptoms and signs involving the genitourinary system: Secondary | ICD-10-CM

## 2020-04-19 DIAGNOSIS — R3915 Urgency of urination: Secondary | ICD-10-CM

## 2020-04-19 LAB — POCT URINALYSIS DIPSTICK
Bilirubin, UA: NEGATIVE
Blood, UA: NEGATIVE
Glucose, UA: NEGATIVE
Ketones, UA: NEGATIVE
Leukocytes, UA: NEGATIVE
Nitrite, UA: NEGATIVE
Protein, UA: POSITIVE — AB
pH, UA: 5 (ref 5.0–8.0)

## 2020-04-19 NOTE — Progress Notes (Signed)
HPI:      [name] is a 25 y.o. G0P0000 who LMP was Patient's last menstrual period was 04/03/2020 (exact date)., presents today for a problem visit.    Urinary Tract Infection: Patient complains of urgency and no other bladder sx's (denies dysuria, freq, incontinence, nocturia) . She has had symptoms for 2 days. Patient also complains of no other concerns. Patient denies back pain, congestion, cough, fever, headache and vaginal discharge. Patient does not have a history of recurrent UTI.  Patient does not have a history of pyelonephritis.   PMHx: She  has a past medical history of Acne, Allergy, Anxiety, Asthma, Chronic constipation, Chronic dermatitis, Constipation, Depression, Dermatitis, Dysmetabolic syndrome, Genital herpes, Hypertension, Irritable bowel syndrome without diarrhea, Kidney disease, Morbid obesity with BMI of 40.0-44.9, adult (HCC), Panic disorder, PCOS (polycystic ovarian syndrome), Pre-diabetes, Proteinuria, Stomach ulcer, and Vitamin D deficiency. Also,  has no past surgical history on file., family history includes Cancer in her father; Diabetes in her father; Hypertension in her mother; Kidney disease in her father; Obesity in her maternal grandmother and mother.,  reports that she has never smoked. She has never used smokeless tobacco. She reports current alcohol use. She reports that she does not use drugs.  She has a current medication list which includes the following prescription(s): albuterol, atenolol, omron 5 series bp monitor, desogestrel-ethinyl estradiol, desoximetasone, duloxetine hcl, enalapril, fluticasone, ketoconazole, ketoconazole (bulk), loratadine, montelukast, nystatin, polyethylene glycol powder, ozempic (0.25 or 0.5 mg/dose), terbinafine, triamcinolone, and valacyclovir. Also, has No Known Allergies.  Review of Systems  Gastrointestinal: Negative for abdominal pain.  Genitourinary: Positive for urgency. Negative for dysuria, flank pain, frequency  and hematuria.    Objective: BP 124/60   Ht 5\' 7"  (1.702 m)   Wt 234 lb (106.1 kg)   LMP 04/03/2020 (Exact Date)   BMI 36.65 kg/m  Physical Exam Constitutional:      General: She is not in acute distress.    Appearance: She is well-developed and well-nourished.  Musculoskeletal:        General: Normal range of motion.  Neurological:     Mental Status: She is alert and oriented to person, place, and time.  Skin:    General: Skin is warm and dry.  Psychiatric:        Mood and Affect: Mood and affect normal.  Vitals reviewed.     Results for orders placed or performed in visit on 04/19/20  POCT Urinalysis Dipstick  Result Value Ref Range   Color, UA     Clarity, UA     Glucose, UA Negative Negative   Bilirubin, UA neg    Ketones, UA neg    Spec Grav, UA     Blood, UA neg    pH, UA 5.0 5.0 - 8.0   Protein, UA Positive (A) Negative   Urobilinogen, UA     Nitrite, UA neg    Leukocytes, UA Negative Negative   Appearance     Odor      ASSESSMENT/PLAN:   Urgency  Problem List Items Addressed This Visit    Visit Diagnoses    UTI symptoms       Relevant Orders   POCT Urinalysis Dipstick (Completed)   Urgency of urination        No sign of UTI based on lab results Acidic urine, OAB possible etiology.  Discussed diet (she eats a lot of nuts, that may contribute to IC).  If this becomes more chronic in nature, then will  investigate further.  Annamarie Major, MD, [facility], [facility] 04/19/2020  2:57 PM

## 2020-04-21 LAB — URINE CULTURE

## 2020-05-09 ENCOUNTER — Encounter: Payer: Self-pay | Admitting: Family Medicine

## 2020-05-10 ENCOUNTER — Other Ambulatory Visit: Payer: Self-pay

## 2020-05-10 ENCOUNTER — Other Ambulatory Visit: Payer: Self-pay | Admitting: Family Medicine

## 2020-05-10 DIAGNOSIS — Z3041 Encounter for surveillance of contraceptive pills: Secondary | ICD-10-CM

## 2020-05-10 MED ORDER — DESOGESTREL-ETHINYL ESTRADIOL 0.15-30 MG-MCG PO TABS: 1.0000 | ORAL_TABLET | Freq: Every day | ORAL | 1 refills | Status: DC

## 2020-05-21 NOTE — Progress Notes (Signed)
Name: Rachel Dunlap   MRN: [id number]    DOB: March 27, 1996   Date:05/22/2020       Progress Note  Subjective  Chief Complaint  Follow up   HPI    Obesity:She could not afford Belviq. She is on Ozempic and doing well. [place] another 5   lbs since January 2022, she has lost 27 lbs since Dec 2020 by cutting down on sweet beverages, drinking more water, cooking at home, balanced meals. She uses a scale to measure her food intake. Having oatmeal for breakfast   Depression Major: she is back in therapy with [facility] because during a family vacation to  [place] this past Summer, her family members  noticed that she snappy which is not her normal behavior and she realized she needed help again. She was taking Wellbutrin XL 150 but is currently on Duloxetine 40 mg and is tolerating medication and seems to be helping her mood. She is excited about getting a Education officer, environmental. 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  HTN: shedenies sob or chest pain. Also sees Dr. Ronn Melena and is taking vasotec because of microalbuminuria. She is aware of teratogenic effects of ACEShe is also on AtenololCompliant with medication , she now has a monitor at home and it has been in the 10's/70's, today also at goal   Pre-diabetes: she denies polyphagia, polydipsia or polyuria.Fasting insulin elevated, she has been physically active and eating healthy, last A1C was normal, she has lost almost 30 lbs in the past 14 months   AR: doing well at this time, taking medications, no rhinorrhea or congestion. Taking singulair and loratadine. 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 Same   Constipation: long history of constipation,currently using Miralax and has been working well as long as she takes it daily . No straining or blood in stools. She has been water , [place] 3-4 , she notices it gets harder when decreases fiber intake  Eczema: uses Topicort prn and currently has a rash on right lower, it is itchy   Patient Active Problem List   Diagnosis Date Noted  . Eczema 08/31/2018  . Insulin resistance 05/16/2018  . Prediabetes 02/14/2018  . Essential hypertension 02/14/2018  . Moderate recurrent major depression (HCC) 02/16/2017  . Back pain due to injury 09/18/2016  . Acne vulgaris 09/16/2015  . Hypertrichosis 09/16/2015  . Chronic constipation 11/09/2014  . Chronic dermatitis 11/09/2014  . Dysmetabolic syndrome 11/09/2014  . Hypertension, benign 11/09/2014  . Genital herpes in women 11/09/2014  . Mild intermittent asthma 11/09/2014  . Allergic rhinitis, seasonal 11/09/2014  . Class 2 severe obesity with serious comorbidity and body mass index (BMI) of 38.0 to 38.9 in adult (HCC) 11/09/2014  . IBS (irritable bowel syndrome) 11/09/2014  . Bilateral polycystic ovarian syndrome 11/09/2014  . Abnormal presence of protein in urine 11/09/2014  . Vitamin D deficiency 11/09/2014  . Anxiety 11/09/2014    History reviewed. No pertinent surgical history.  Family History  Problem Relation Age of Onset  . Hypertension Mother   . Obesity Mother   .  Cancer Father   . Diabetes Father   . Kidney disease Father   . Obesity Maternal Grandmother     Social History   Tobacco Use  . Smoking status: Never Smoker  . Smokeless tobacco: Never Used  Substance Use Topics  . Alcohol use: Yes    Alcohol/week: 0.0 standard drinks    Comment: seldom     Current Outpatient Medications:  .  albuterol (VENTOLIN HFA) 108 (90 Base) MCG/ACT inhaler, Inhale 2 puffs into the  lungs every 6 (six) hours as needed for wheezing or shortness of breath., Disp: 6.7 Inhaler, Rfl: 0 .  atenolol (TENORMIN) 25 MG tablet, Take 1 tablet (25 mg total) by mouth daily., Disp: 90 tablet, Rfl: 1 .  Blood Pressure Monitoring (OMRON 5 SERIES BP MONITOR) DEVI, , Disp: , Rfl:  .  desogestrel-ethinyl estradiol (APRI) 0.15-30 MG-MCG tablet, Take 1 tablet by mouth daily., Disp: 84 tablet, Rfl: 1 .  desoximetasone (TOPICORT) 0.25 % cream, Apply 1 application topically 2 (two) times daily., Disp: 100 g, Rfl: 0 .  DULoxetine HCl 40 MG CPEP, TAKE 1 CAPSULE BY MOUTH EVERY DAY, Disp: 90 capsule, Rfl: 0 .  enalapril (VASOTEC) 5 MG tablet, Take 1 tablet by mouth daily., Disp: , Rfl:  .  fluticasone (FLONASE) 50 MCG/ACT nasal spray, Place 2 sprays into both nostrils at bedtime., Disp: 48 g, Rfl: 1 .  ketoconazole (NIZORAL) 2 % cream, APPLY 1 APPLICATION TOPICALLY AT BEDTIME., Disp: 60 g, Rfl: 0 .  KETOCONAZOLE, BULK, POWD, 1 mg by Does not apply route in the morning and at bedtime., Disp: 1000 g, Rfl: 0 .  loratadine (CLARITIN) 10 MG tablet, TAKE 1 TABLET BY MOUTH EVERY DAY, Disp: 90 tablet, Rfl: 2 .  montelukast (SINGULAIR) 10 MG tablet, TAKE 1 TABLET BY MOUTH EVERYDAY AT BEDTIME, Disp: 90 tablet, Rfl: 2 .  nystatin (MYCOSTATIN/NYSTOP) powder, Apply 1 application topically 3 (three) times daily., Disp: 30 g, Rfl: 3 .  polyethylene glycol powder (GLYCOLAX/MIRALAX) 17 GM/SCOOP powder, Take 17 g by mouth 2 (two) times daily as needed., Disp: 3350 g, Rfl: 1 .  Semaglutide,0.25 or 0.5MG /DOS, (OZEMPIC, 0.25 OR 0.5 MG/DOSE,) 2 MG/1.5ML SOPN, Inject 0.5 mg into the skin once a week., Disp: 1.5 mL, Rfl: 2 .  terbinafine (LAMISIL) 250 MG tablet, Take 1 tablet (250 mg total) by mouth daily., Disp: 14 tablet, Rfl: 0 .  triamcinolone cream (KENALOG) 0.1 %, Apply 1 application topically 2 (two) times daily., Disp: 45 g, Rfl: 0 .  valACYclovir (VALTREX) 500 MG tablet, Take 1 tablet (500 mg total) by mouth daily. And  twice daily on outbreaks, Disp: 115 tablet, Rfl: 1  No Known Allergies  I personally reviewed active problem list, medication list, allergies, family history, social history, health maintenance with the patient/caregiver today.   ROS  Constitutional: Negative for fever , positive for  weight change.  Respiratory: Negative for cough and shortness of breath.   Cardiovascular: Negative for chest pain or palpitations.  Gastrointestinal: Negative for abdominal pain, no bowel changes.  Musculoskeletal: Negative for gait problem or joint swelling.  Skin: positive  for rash.  Neurological: Negative for dizziness or headache.  No other specific complaints in a complete review of systems (except as listed in HPI above).   Objective  Vitals:   05/22/20 1436  BP: 122/72  Pulse: 95  Resp: 18  Temp: 98 F (36.7 C)  TempSrc: Oral  SpO2: 97%  Weight: 229 lb 9.6 oz (104.1 kg)  Height: 5'  7" (1.702 m)    Body mass index is 35.96 kg/m.  Physical Exam  Constitutional: Patient appears well-developed and well-nourished. Obese  No distress.  HEENT: head atraumatic, normocephalic, pupils equal and reactive to light,neck supple Cardiovascular: Normal rate, regular rhythm and normal heart sounds.  No murmur heard. No BLE edema. Pulmonary/Chest: Effort normal and breath sounds normal. No respiratory distress. Abdominal: Soft.  There is no tenderness. Skin: erythematous rash on right lower anterior tibia  Psychiatric: Patient has a normal mood and affect. behavior is normal. Judgment and thought content normal.  Recent Results (from the past 2160 hour(s))  Cytology - PAP     Status: None   Collection Time: 03/06/20  2:22 PM  Result Value Ref Range   Adequacy      Satisfactory for evaluation; transformation zone component PRESENT.   Diagnosis      - Negative for intraepithelial lesion or malignancy (NILM)  POCT Urinalysis Dipstick     Status: Abnormal   Collection Time: 04/19/20  2:43 PM   Result Value Ref Range   Color, UA     Clarity, UA     Glucose, UA Negative Negative   Bilirubin, UA neg    Ketones, UA neg    Spec Grav, UA     Blood, UA neg    pH, UA 5.0 5.0 - 8.0   Protein, UA Positive (A) Negative   Urobilinogen, UA     Nitrite, UA neg    Leukocytes, UA Negative Negative   Appearance     Odor    Urine Culture     Status: None   Collection Time: 04/19/20  3:20 PM   Specimen: Urine, Clean Catch   UC  Result Value Ref Range   Urine Culture, Routine Final report    Organism ID, Bacteria Comment     Comment: Mixed urogenital flora 25,000-50,000 colony forming units per mL       PHQ2/9: [facility] screen [facility] 2/9 05/22/2020 02/20/2020 01/16/2020 11/30/2019 09/25/2019  Decreased Interest 0 0 0 1 0  Down, Depressed, Hopeless 0 0 0 2 0  PHQ - 2 Score 0 0 0 3 0  Altered sleeping - 0 0 2 0  Tired, decreased energy - 0 0 2 1  Change in appetite - 0 0 0 0  Feeling bad or failure about yourself  - 0 0 0 0  Trouble concentrating - 0 0 0 1  Moving slowly or fidgety/restless - 0 0 0 0  Suicidal thoughts - 0 0 0 0  PHQ-9 Score - 0 0 7 2  Difficult doing work/chores - - - Somewhat difficult Not difficult at all  Some recent data might be hidden    phq 9 is negative  Fall Risk: Fall Risk  05/22/2020 02/20/2020 01/16/2020 11/30/2019 09/25/2019  Falls in the past year? 0 0 0 0 1  Number falls in past yr: 0 0 0 0 0  Injury with Fall? 0 0 0 0 0     Functional Status Survey: Is the patient deaf or have difficulty hearing?: No Does the patient have difficulty seeing, even when wearing glasses/contacts?: No Does the patient have difficulty concentrating, remembering, or making decisions?: No Does the patient have difficulty walking or climbing stairs?: No Does the patient have difficulty dressing or bathing?: No Does the patient have difficulty doing errands alone such as visiting a doctor's office or shopping?: No    Assessment & Plan  1. Major depression in  remission (HCC)  -  DULoxetine HCl 40 MG CPEP; Take 1 tablet by mouth daily.  Dispense: 90 capsule; Refill: 1  2. Morbid obesity (HCC)  Discussed with the patient the risk posed by an increased BMI. Discussed importance of portion control, calorie counting and at least 150 minutes of physical activity weekly. Avoid sweet beverages and drink more water. Eat at least 6 servings of fruit and vegetables daily   3. Prediabetes   4. Vitamin D deficiency   5. Hypertension, benign  - atenolol (TENORMIN) 25 MG tablet; Take 1 tablet (25 mg total) by mouth daily.  Dispense: 90 tablet; Refill: 1  6. Mild intermittent asthma, uncomplicated  - albuterol (VENTOLIN HFA) 108 (90 Base) MCG/ACT inhaler; Inhale 2 puffs into the lungs every 6 (six) hours as needed for wheezing or shortness of breath.  Dispense: 6.7 each; Refill: 0  7. Perennial allergic rhinitis with seasonal variation  - loratadine (CLARITIN) 10 MG tablet; Take 1 tablet (10 mg total) by mouth daily.  Dispense: 90 tablet; Refill: 1  8. Metabolic syndrome   9. Flexural eczema  - desoximetasone (TOPICORT) 0.25 % cream; Apply 1 application topically 2 (two) times daily.  Dispense: 100 g; Refill: 0  10. Chronic constipation  - polyethylene glycol powder (GLYCOLAX/MIRALAX) 17 GM/SCOOP powder; Take 17 g by mouth 2 (two) times daily as needed.  Dispense: 3350 g; Refill: 1  11. Insulin resistance  - Semaglutide,0.25 or 0.5MG /DOS, (OZEMPIC, 0.25 OR 0.5 MG/DOSE,) 2 MG/1.5ML SOPN; Inject 0.5 mg into the skin once a week.  Dispense: 4.5 mL; Refill: 1  12. Herpes simplex vulvovaginitis  - valACYclovir (VALTREX) 500 MG tablet; Take 1 tablet (500 mg total) by mouth daily. And twice daily on outbreaks  Dispense: 115 tablet; Refill: 1

## 2020-05-22 ENCOUNTER — Other Ambulatory Visit: Payer: Self-pay

## 2020-05-22 ENCOUNTER — Ambulatory Visit (INDEPENDENT_AMBULATORY_CARE_PROVIDER_SITE_OTHER): Payer: 59 | Admitting: Family Medicine

## 2020-05-22 ENCOUNTER — Encounter: Payer: Self-pay | Admitting: Family Medicine

## 2020-05-22 VITALS — BP 122/72 | HR 95 | Temp 98.0°F | Resp 18 | Ht 67.0 in | Wt 229.6 lb

## 2020-05-22 DIAGNOSIS — F325 Major depressive disorder, single episode, in full remission: Secondary | ICD-10-CM | POA: Diagnosis not present

## 2020-05-22 DIAGNOSIS — E559 Vitamin D deficiency, unspecified: Secondary | ICD-10-CM

## 2020-05-22 DIAGNOSIS — L2082 Flexural eczema: Secondary | ICD-10-CM

## 2020-05-22 DIAGNOSIS — I10 Essential (primary) hypertension: Secondary | ICD-10-CM

## 2020-05-22 DIAGNOSIS — R7303 Prediabetes: Secondary | ICD-10-CM | POA: Diagnosis not present

## 2020-05-22 DIAGNOSIS — J3089 Other allergic rhinitis: Secondary | ICD-10-CM

## 2020-05-22 DIAGNOSIS — J302 Other seasonal allergic rhinitis: Secondary | ICD-10-CM

## 2020-05-22 DIAGNOSIS — J4520 Mild intermittent asthma, uncomplicated: Secondary | ICD-10-CM

## 2020-05-22 DIAGNOSIS — A6004 Herpesviral vulvovaginitis: Secondary | ICD-10-CM

## 2020-05-22 DIAGNOSIS — E8881 Metabolic syndrome: Secondary | ICD-10-CM

## 2020-05-22 DIAGNOSIS — K5909 Other constipation: Secondary | ICD-10-CM

## 2020-05-22 MED ORDER — VALACYCLOVIR HCL 500 MG PO TABS: 500.0000 mg | ORAL_TABLET | Freq: Every day | ORAL | 1 refills | Status: DC

## 2020-05-22 MED ORDER — DULOXETINE HCL 40 MG PO CPEP: 1.0000 | ORAL_CAPSULE | Freq: Every day | ORAL | 1 refills | Status: DC

## 2020-05-22 MED ORDER — DESOXIMETASONE 0.25 % EX CREA: 1.0000 "application " | TOPICAL_CREAM | Freq: Two times a day (BID) | CUTANEOUS | 0 refills | Status: DC

## 2020-05-22 MED ORDER — LORATADINE 10 MG PO TABS: 10.0000 mg | ORAL_TABLET | Freq: Every day | ORAL | 1 refills | Status: DC

## 2020-05-22 MED ORDER — OZEMPIC (0.25 OR 0.5 MG/DOSE) 2 MG/1.5ML ~~LOC~~ SOPN: 0.5000 mg | PEN_INJECTOR | SUBCUTANEOUS | 1 refills | Status: DC

## 2020-05-22 MED ORDER — ATENOLOL 25 MG PO TABS: 25.0000 mg | ORAL_TABLET | Freq: Every day | ORAL | 1 refills | Status: DC

## 2020-05-22 MED ORDER — ALBUTEROL SULFATE HFA 108 (90 BASE) MCG/ACT IN AERS: 2.0000 | INHALATION_SPRAY | Freq: Four times a day (QID) | RESPIRATORY_TRACT | 0 refills | Status: DC | PRN

## 2020-05-22 MED ORDER — POLYETHYLENE GLYCOL 3350 17 GM/SCOOP PO POWD: 17.0000 g | Freq: Two times a day (BID) | ORAL | 1 refills | Status: DC | PRN

## 2020-08-13 ENCOUNTER — Other Ambulatory Visit: Payer: Self-pay | Admitting: Family Medicine

## 2020-08-13 DIAGNOSIS — A6004 Herpesviral vulvovaginitis: Secondary | ICD-10-CM

## 2020-08-13 DIAGNOSIS — F325 Major depressive disorder, single episode, in full remission: Secondary | ICD-10-CM

## 2020-08-13 DIAGNOSIS — B356 Tinea cruris: Secondary | ICD-10-CM

## 2020-08-29 ENCOUNTER — Encounter: Payer: Self-pay | Admitting: Family Medicine

## 2020-09-03 ENCOUNTER — Encounter: Payer: Self-pay | Admitting: Family Medicine

## 2020-09-03 ENCOUNTER — Other Ambulatory Visit: Payer: Self-pay

## 2020-09-03 ENCOUNTER — Other Ambulatory Visit: Payer: Self-pay | Admitting: Family Medicine

## 2020-09-03 ENCOUNTER — Ambulatory Visit (INDEPENDENT_AMBULATORY_CARE_PROVIDER_SITE_OTHER): Payer: 59 | Admitting: Family Medicine

## 2020-09-03 VITALS — BP 126/72 | HR 100 | Temp 98.1°F | Resp 16 | Ht 67.0 in | Wt 226.0 lb

## 2020-09-03 DIAGNOSIS — F419 Anxiety disorder, unspecified: Secondary | ICD-10-CM | POA: Diagnosis not present

## 2020-09-03 DIAGNOSIS — F325 Major depressive disorder, single episode, in full remission: Secondary | ICD-10-CM

## 2020-09-03 MED ORDER — VENLAFAXINE HCL ER 37.5 MG PO CP24: 37.5000 mg | ORAL_CAPSULE | Freq: Every day | ORAL | 0 refills | Status: DC

## 2020-09-03 NOTE — Progress Notes (Signed)
Name: Rachel Dunlap   MRN: [id number]    DOB: [birth year]   Date:09/03/2020       Progress Note  Subjective  Chief Complaint  Medication Management- Anxiety  HPI  Obesity:She could not afford Belviq. She is on Ozempic and doing well. She is  cutting down on sweet beverages, drinking more water, cooking at home, balanced meals. She uses a scale to measure her food intake. Weight is down another 3 lbs since last visit   Depression Major: she is back in therapy [facility] since Summer 2021 , visits are now every two weeks.She was taking Wellbutrin XL 150 but switched to  Duloxetine 40 mg since 60 mg caused side effects, medication was working well however not covered by insurance. Since she ran out of medication she has noticed that she has been getting snappy, having difficulty processing her emotions. She would like to try something else. Discussed trying Effexor and she is wiling to try it   HTN: shedenies sob or chest pain. Also sees Dr. Ronn Melena and is taking vasotec because of microalbuminuria. She is aware of teratogenic effects of ACEShe is also on AtenololBP is at goal. No side effects      Patient Active Problem List   Diagnosis Date Noted  . Eczema 08/31/2018  . Insulin resistance 05/16/2018  . Prediabetes 02/14/2018  . Essential hypertension 02/14/2018  . Moderate recurrent major depression (HCC) 02/16/2017  . Back pain due to injury 09/18/2016  . Acne vulgaris 09/16/2015  . Hypertrichosis 09/16/2015  . Chronic constipation 11/09/2014  . Chronic dermatitis 11/09/2014  . Dysmetabolic syndrome 11/09/2014  . Hypertension, benign 11/09/2014  . Genital herpes in women 11/09/2014  . Mild intermittent asthma 11/09/2014  . Allergic rhinitis, seasonal 11/09/2014  . Class 2 severe obesity with serious comorbidity and body mass index (BMI) of 38.0 to 38.9 in adult (HCC) 11/09/2014  . IBS (irritable bowel syndrome) 11/09/2014  . Bilateral polycystic ovarian syndrome  11/09/2014  . Abnormal presence of protein in urine 11/09/2014  . Vitamin D deficiency 11/09/2014  . Anxiety 11/09/2014    No past surgical history on file.  Family History  Problem Relation Age of Onset  . Hypertension Mother   . Obesity Mother   . Cancer Father   . Diabetes Father   . Kidney disease Father   . Obesity Maternal Grandmother     Social History   Tobacco Use  . Smoking status: Never Smoker  . Smokeless tobacco: Never Used  Substance Use Topics  . Alcohol use: Yes    Alcohol/week: 0.0 standard drinks    Comment: seldom     Current Outpatient Medications:  .  albuterol (VENTOLIN HFA) 108 (90 Base) MCG/ACT inhaler, Inhale 2 puffs into the lungs every 6 (six) hours as needed for wheezing or shortness of breath., Disp: 6.7 each, Rfl: 0 .  atenolol (TENORMIN) 25 MG tablet, Take 1 tablet (25 mg total) by mouth daily., Disp: 90 tablet, Rfl: 1 .  Blood Pressure Monitoring (OMRON 5 SERIES BP MONITOR) DEVI, , Disp: , Rfl:  .  desogestrel-ethinyl estradiol (APRI) 0.15-30 MG-MCG tablet, Take 1 tablet by mouth daily., Disp: 84 tablet, Rfl: 1 .  desoximetasone (TOPICORT) 0.25 % cream, Apply 1 application topically 2 (two) times daily., Disp: 100 g, Rfl: 0 .  enalapril (VASOTEC) 5 MG tablet, Take 1 tablet by mouth daily., Disp: , Rfl:  .  fluticasone (FLONASE) 50 MCG/ACT nasal spray, Place 2 sprays into both nostrils at bedtime., Disp: 48  g, Rfl: 1 .  ketoconazole (NIZORAL) 2 % cream, APPLY 1 APPLICATION TOPICALLY AT BEDTIME., Disp: 60 g, Rfl: 0 .  loratadine (CLARITIN) 10 MG tablet, Take 1 tablet (10 mg total) by mouth daily., Disp: 90 tablet, Rfl: 1 .  montelukast (SINGULAIR) 10 MG tablet, TAKE 1 TABLET BY MOUTH EVERYDAY AT BEDTIME, Disp: 90 tablet, Rfl: 2 .  nystatin (MYCOSTATIN/NYSTOP) powder, Apply 1 application topically 3 (three) times daily., Disp: 30 g, Rfl: 3 .  polyethylene glycol powder (GLYCOLAX/MIRALAX) 17 GM/SCOOP powder, Take 17 g by mouth 2 (two) times daily  as needed., Disp: 3350 g, Rfl: 1 .  Semaglutide,0.25 or 0.5MG /DOS, (OZEMPIC, 0.25 OR 0.5 MG/DOSE,) 2 MG/1.5ML SOPN, Inject 0.5 mg into the skin once a week., Disp: 4.5 mL, Rfl: 1 .  triamcinolone cream (KENALOG) 0.1 %, Apply 1 application topically 2 (two) times daily., Disp: 45 g, Rfl: 0 .  valACYclovir (VALTREX) 500 MG tablet, TAKE 1 TABLET (500 MG TOTAL) BY MOUTH DAILY. AND TWICE DAILY ON OUTBREAKS, Disp: 115 tablet, Rfl: 1 .  venlafaxine XR (EFFEXOR XR) 37.5 MG 24 hr capsule, Take 1 capsule (37.5 mg total) by mouth daily with breakfast. After first week take two at the same time, Disp: 60 capsule, Rfl: 0 .  KETOCONAZOLE, BULK, POWD, 1 mg by Does not apply route in the morning and at bedtime. (Patient not taking: Reported on 09/03/2020), Disp: 1000 g, Rfl: 0  No Known Allergies  I personally reviewed active problem list, medication list, allergies, family history, social history, health maintenance with the patient/caregiver today.   ROS  Ten systems reviewed and is negative except as mentioned in HPI   Objective  Vitals:   09/03/20 1057  BP: 126/72  Pulse: 100  Resp: 16  Temp: 98.1 F (36.7 C)  TempSrc: Oral  SpO2: 98%  Weight: 226 lb (102.5 kg)  Height: 5\' 7"  (1.702 m)    Body mass index is 35.4 kg/m.  Physical Exam  Constitutional: Patient appears well-developed and well-nourished. Obese  No distress.  HEENT: head atraumatic, normocephalic, pupils equal and reactive to light,  neck supple Cardiovascular: Normal rate, regular rhythm and normal heart sounds.  No murmur heard. No BLE edema. Pulmonary/Chest: Effort normal and breath sounds normal. No respiratory distress. Abdominal: Soft.  There is no tenderness. Psychiatric: Patient has a normal mood and affect. behavior is normal. Judgment and thought content normal.  PHQ2/9: Depression screen [facility] 2/9 09/03/2020 05/22/2020 02/20/2020 01/16/2020 11/30/2019  Decreased Interest 0 0 0 0 1  Down, Depressed, Hopeless 0 0 0 0 2   PHQ - 2 Score 0 0 0 0 3  Altered sleeping 0 - 0 0 2  Tired, decreased energy 0 - 0 0 2  Change in appetite 0 - 0 0 0  Feeling bad or failure about yourself  0 - 0 0 0  Trouble concentrating 0 - 0 0 0  Moving slowly or fidgety/restless 0 - 0 0 0  Suicidal thoughts 0 - 0 0 0  PHQ-9 Score 0 - 0 0 7  Difficult doing work/chores - - - - Somewhat difficult  Some recent data might be hidden    phq 9 is negative   Fall Risk: Fall Risk  09/03/2020 05/22/2020 02/20/2020 01/16/2020 11/30/2019  Falls in the past year? 0 0 0 0 0  Number falls in past yr: 0 0 0 0 0  Injury with Fall? 0 0 0 0 0     Functional Status Survey: Is the patient  deaf or have difficulty hearing?: No Does the patient have difficulty seeing, even when wearing glasses/contacts?: No Does the patient have difficulty concentrating, remembering, or making decisions?: No Does the patient have difficulty walking or climbing stairs?: No Does the patient have difficulty dressing or bathing?: No Does the patient have difficulty doing errands alone such as visiting a doctor's office or shopping?: No   Assessment & Plan  1. Major depression in remission (HCC)  - venlafaxine XR (EFFEXOR XR) 37.5 MG 24 hr capsule; Take 1 capsule (37.5 mg total) by mouth daily with breakfast. After first week take two at the same time  Dispense: 60 capsule; Refill: 0  2. Anxiety  - venlafaxine XR (EFFEXOR XR) 37.5 MG 24 hr capsule; Take 1 capsule (37.5 mg total) by mouth daily with breakfast. After first week take two at the same time  Dispense: 60 capsule; Refill: 0

## 2020-09-04 ENCOUNTER — Ambulatory Visit: Payer: Federal, State, Local not specified - PPO | Admitting: Obstetrics & Gynecology

## 2020-09-11 ENCOUNTER — Ambulatory Visit: Payer: 59 | Admitting: Obstetrics & Gynecology

## 2020-09-30 ENCOUNTER — Other Ambulatory Visit: Payer: Self-pay | Admitting: Family Medicine

## 2020-09-30 DIAGNOSIS — F419 Anxiety disorder, unspecified: Secondary | ICD-10-CM

## 2020-09-30 DIAGNOSIS — F325 Major depressive disorder, single episode, in full remission: Secondary | ICD-10-CM

## 2020-09-30 NOTE — Telephone Encounter (Signed)
Pt has an appt on 10/18/20

## 2020-09-30 NOTE — Telephone Encounter (Signed)
   Notes to clinic:  requesting a 90 day supply  Requested Prescriptions  Pending Prescriptions Disp Refills   venlafaxine XR (EFFEXOR-XR) 37.5 MG 24 hr capsule [Pharmacy Med Name: VENLAFAXINE HCL ER 37.5 MG CAP] 90 capsule 1    Sig: TAKE 1 CAPSULE BY MOUTH DAILY WITH BREAKFAST.      Psychiatry: Antidepressants - SNRI - desvenlafaxine & venlafaxine Failed - 09/30/2020  1:30 PM      Failed - LDL in normal range and within 360 days    LDL Cholesterol (Calc)  Date Value Ref Range Status  09/25/2019 84 mg/dL (calc) Final    Comment:    Reference range: <100 . Desirable range <100 mg/dL for primary prevention;   <70 mg/dL for patients with CHD or diabetic patients  with > or = 2 CHD risk factors. Marland Kitchen LDL-C is now calculated using the Martin-Hopkins  calculation, which is a validated novel method providing  better accuracy than the Friedewald equation in the  estimation of LDL-C.  Horald Pollen et al. Lenox Ahr. 9678;938(10): 2061-2068  (http://education.[facility].com/faq/FAQ164)           Failed - Total Cholesterol in normal range and within 360 days    Cholesterol  Date Value Ref Range Status  09/25/2019 169 <200 mg/dL Final          Failed - Triglycerides in normal range and within 360 days    Triglycerides  Date Value Ref Range Status  09/25/2019 60 <150 mg/dL Final          Passed - Completed PHQ-2 or PHQ-9 in the last 360 days      Passed - Last BP in normal range    BP Readings from Last 1 Encounters:  09/03/20 126/72          Passed - Valid encounter within last 6 months    Recent Outpatient Visits           3 weeks ago Major depression in remission [facility])   [facility] Alba Cory, MD   4 months ago Major depression in remission [facility])   [facility] Alba Cory, MD   7 months ago Metabolic syndrome   [facility] Alba Cory, MD   8 months ago Class 2 severe obesity with serious  comorbidity and body mass index (BMI) of 38.0 to 38.9 in adult, unspecified obesity type [facility])   [facility] Alba Cory, MD   10 months ago Hypertension, benign   [facility] Alba Cory, MD       Future Appointments             In 2 weeks Alba Cory, MD [facility]   In 1 month Alba Cory, MD [place], [facility]

## 2020-10-18 ENCOUNTER — Encounter: Payer: 59 | Admitting: Family Medicine

## 2020-11-20 ENCOUNTER — Other Ambulatory Visit: Payer: Self-pay | Admitting: Family Medicine

## 2020-11-20 ENCOUNTER — Encounter: Payer: Self-pay | Admitting: Family Medicine

## 2020-11-20 ENCOUNTER — Ambulatory Visit: Payer: 59 | Admitting: Family Medicine

## 2020-11-20 ENCOUNTER — Ambulatory Visit (INDEPENDENT_AMBULATORY_CARE_PROVIDER_SITE_OTHER): Payer: 59 | Admitting: Family Medicine

## 2020-11-20 VITALS — BP 116/64 | HR 90 | Temp 98.1°F | Resp 16 | Ht 67.0 in | Wt 223.0 lb

## 2020-11-20 DIAGNOSIS — Z23 Encounter for immunization: Secondary | ICD-10-CM

## 2020-11-20 DIAGNOSIS — E8881 Metabolic syndrome: Secondary | ICD-10-CM

## 2020-11-20 DIAGNOSIS — F325 Major depressive disorder, single episode, in full remission: Secondary | ICD-10-CM | POA: Diagnosis not present

## 2020-11-20 DIAGNOSIS — J302 Other seasonal allergic rhinitis: Secondary | ICD-10-CM

## 2020-11-20 DIAGNOSIS — E669 Obesity, unspecified: Secondary | ICD-10-CM

## 2020-11-20 DIAGNOSIS — R7303 Prediabetes: Secondary | ICD-10-CM

## 2020-11-20 DIAGNOSIS — Z3041 Encounter for surveillance of contraceptive pills: Secondary | ICD-10-CM

## 2020-11-20 DIAGNOSIS — I10 Essential (primary) hypertension: Secondary | ICD-10-CM | POA: Diagnosis not present

## 2020-11-20 DIAGNOSIS — L2082 Flexural eczema: Secondary | ICD-10-CM

## 2020-11-20 DIAGNOSIS — J4520 Mild intermittent asthma, uncomplicated: Secondary | ICD-10-CM

## 2020-11-20 DIAGNOSIS — K5909 Other constipation: Secondary | ICD-10-CM

## 2020-11-20 DIAGNOSIS — E559 Vitamin D deficiency, unspecified: Secondary | ICD-10-CM

## 2020-11-20 DIAGNOSIS — F419 Anxiety disorder, unspecified: Secondary | ICD-10-CM

## 2020-11-20 DIAGNOSIS — J3089 Other allergic rhinitis: Secondary | ICD-10-CM

## 2020-11-20 MED ORDER — VENLAFAXINE HCL ER 37.5 MG PO CP24: 37.5000 mg | ORAL_CAPSULE | Freq: Every day | ORAL | 0 refills | Status: DC

## 2020-11-20 MED ORDER — FLUTICASONE PROPIONATE 50 MCG/ACT NA SUSP: 2.0000 | Freq: Every day | NASAL | 1 refills | Status: DC

## 2020-11-20 MED ORDER — LORATADINE 10 MG PO TABS: 10.0000 mg | ORAL_TABLET | Freq: Every day | ORAL | 1 refills | Status: DC

## 2020-11-20 MED ORDER — MONTELUKAST SODIUM 10 MG PO TABS: ORAL_TABLET | ORAL | 1 refills | Status: DC

## 2020-11-20 MED ORDER — OZEMPIC (0.25 OR 0.5 MG/DOSE) 2 MG/1.5ML ~~LOC~~ SOPN: 0.5000 mg | PEN_INJECTOR | SUBCUTANEOUS | 1 refills | Status: DC

## 2020-11-20 MED ORDER — DESOGESTREL-ETHINYL ESTRADIOL 0.15-30 MG-MCG PO TABS: 1.0000 | ORAL_TABLET | Freq: Every day | ORAL | 1 refills | Status: DC

## 2020-11-20 MED ORDER — DESOXIMETASONE 0.25 % EX CREA: 1.0000 "application " | TOPICAL_CREAM | Freq: Two times a day (BID) | CUTANEOUS | 0 refills | Status: DC

## 2020-11-20 MED ORDER — ATENOLOL 25 MG PO TABS
25.0000 mg | ORAL_TABLET | Freq: Every day | ORAL | 1 refills | Status: DC
Start: 2020-11-20 — End: 2021-06-05

## 2020-11-20 NOTE — Progress Notes (Signed)
Name: Rachel Dunlap   MRN: [id number]    DOB: 1995/05/16   Date:11/20/2020       Progress Note  Subjective  Chief Complaint  Follow Up  HPI  Obesity: She has a long history of obesity ( since childhood) max weight was 265 lbs . started with live life style modification and in 11/2019 her weight was 248 lbs started on Ozempic 02/2020 for insulin resistance and obesity, her weight was 236 lbs at the time with a BMI of 3701. 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 Weight today is 223 lbs    Depression Major: she is back in therapy with [facility] since Summer 2021 , visits are now every two weeks.Currently on Effexor and is doing well, she wants to continue medication. Phq 9 today is zero.    HTN: she  denies sob or chest pain. Also sees Dr. Ronn Melena and is taking vasotec because of microalbuminuria and also Atenolol.  She is aware of teratogenic effects of ACE    Pre-diabetes: she denies polyphagia, polydipsia or polyuria. Fasting insulin elevated, she has been physically active and eating healthy, last A1C was normal, she is still losing weight.    AR: doing well at this time, taking medications, no rhinorrhea or congestion. Taking singulair and loratadine  symptoms controlled at this time  Asthma: she is taking singulair daily, denies wheezing, cough or SOB. No recent flares  Genital herpes: she takes valtrex daily, she states last outbreak was years ago when she was first diagnosed.  Unchanged    Constipation: long history of constipation,  doing well lately with change in diet, eating more fiber, only taking Miralax prn and bowel movements 3-4 times per week and [place] 4    Eczema: uses Topicort prn and currently has as rash on mid abdomen, likely from belt buckle    ASCUS : in 09/2019 , seen by gyn , had a colposcopy  and is going back for repeat pap smear this month.   Patient Active Problem List   Diagnosis Date Noted   Eczema 08/31/2018   Insulin resistance 05/16/2018   Prediabetes 02/14/2018   Essential hypertension 02/14/2018   Moderate recurrent major depression (HCC) 02/16/2017   Back pain due to injury 09/18/2016   Acne vulgaris 09/16/2015   Hypertrichosis 09/16/2015   Chronic constipation 11/09/2014   Chronic dermatitis 11/09/2014   Dysmetabolic syndrome 11/09/2014   Hypertension, benign 11/09/2014   Genital herpes in women 11/09/2014   Mild intermittent asthma 11/09/2014   Allergic rhinitis, seasonal 11/09/2014   Class 2 severe obesity with serious comorbidity and body mass index (BMI) of 38.0 to 38.9 in adult (HCC) 11/09/2014   IBS (irritable bowel syndrome) 11/09/2014   Bilateral polycystic ovarian syndrome 11/09/2014   Abnormal presence of protein in urine 11/09/2014   Vitamin D deficiency 11/09/2014   Anxiety 11/09/2014    No past surgical history on file.  Family History  Problem Relation Age of Onset   Hypertension Mother    Obesity Mother    Cancer Father    Diabetes Father    Kidney disease Father    Obesity Maternal Grandmother     Social History   Tobacco Use   Smoking status: Never   Smokeless tobacco: Never  Substance Use Topics   Alcohol use: Yes  Alcohol/week: 0.0 standard drinks    Comment: seldom     Current Outpatient Medications:    albuterol (VENTOLIN HFA) 108 (90 Base) MCG/ACT inhaler, Inhale 2 puffs into the lungs every 6 (six) hours as needed for wheezing or shortness of breath., Disp: 6.7 each, Rfl: 0   atenolol (TENORMIN) 25 MG tablet, Take 1 tablet (25 mg total) by mouth daily., Disp: 90 tablet, Rfl: 1   Blood Pressure Monitoring (OMRON 5 SERIES BP MONITOR) DEVI, , Disp: , Rfl:    desogestrel-ethinyl estradiol (APRI) 0.15-30 MG-MCG tablet, Take 1 tablet by mouth daily., Disp: 84 tablet, Rfl: 1   desoximetasone (TOPICORT) 0.25 % cream,  Apply 1 application topically 2 (two) times daily., Disp: 100 g, Rfl: 0   enalapril (VASOTEC) 5 MG tablet, Take 1 tablet by mouth daily., Disp: , Rfl:    fluticasone (FLONASE) 50 MCG/ACT nasal spray, Place 2 sprays into both nostrils at bedtime., Disp: 48 g, Rfl: 1   ketoconazole (NIZORAL) 2 % cream, APPLY 1 APPLICATION TOPICALLY AT BEDTIME., Disp: 60 g, Rfl: 0   loratadine (CLARITIN) 10 MG tablet, Take 1 tablet (10 mg total) by mouth daily., Disp: 90 tablet, Rfl: 1   montelukast (SINGULAIR) 10 MG tablet, TAKE 1 TABLET BY MOUTH EVERYDAY AT BEDTIME, Disp: 90 tablet, Rfl: 2   nystatin (MYCOSTATIN/NYSTOP) powder, Apply 1 application topically 3 (three) times daily., Disp: 30 g, Rfl: 3   polyethylene glycol powder (GLYCOLAX/MIRALAX) 17 GM/SCOOP powder, Take 17 g by mouth 2 (two) times daily as needed., Disp: 3350 g, Rfl: 1   Semaglutide,0.25 or 0.5MG /DOS, (OZEMPIC, 0.25 OR 0.5 MG/DOSE,) 2 MG/1.5ML SOPN, Inject 0.5 mg into the skin once a week., Disp: 4.5 mL, Rfl: 1   triamcinolone cream (KENALOG) 0.1 %, Apply 1 application topically 2 (two) times daily., Disp: 45 g, Rfl: 0   valACYclovir (VALTREX) 500 MG tablet, TAKE 1 TABLET (500 MG TOTAL) BY MOUTH DAILY. AND TWICE DAILY ON OUTBREAKS, Disp: 115 tablet, Rfl: 1   venlafaxine XR (EFFEXOR-XR) 37.5 MG 24 hr capsule, TAKE 1 CAPSULE BY MOUTH DAILY WITH BREAKFAST., Disp: 90 capsule, Rfl: 0  No Known Allergies  I personally reviewed active problem list, medication list, allergies, family history, social history, health maintenance with the patient/caregiver today.   ROS  Constitutional: Negative for fever or weight change.  Respiratory: Negative for cough and shortness of breath.   Cardiovascular: Negative for chest pain or palpitations.  Gastrointestinal: Negative for abdominal pain, no bowel changes.  Musculoskeletal: Negative for gait problem or joint swelling.  Skin: Negative for rash.  Neurological: Negative for dizziness or headache.  No other  specific complaints in a complete review of systems (except as listed in HPI above).   Objective  Vitals:   11/20/20 1030  BP: 116/64  Pulse: 90  Resp: 16  Temp: 98.1 F (36.7 C)  SpO2: 99%  Weight: 223 lb (101.2 kg)  Height: 5\' 7"  (1.702 m)    Body mass index is 34.93 kg/m.  Physical Exam  Constitutional: Patient appears well-developed and well-nourished. Obese  No distress.  HEENT: head atraumatic, normocephalic, pupils equal and reactive to light, neck supple Cardiovascular: Normal rate, regular rhythm and normal heart sounds.  No murmur heard. No BLE edema. Pulmonary/Chest: Effort normal and breath sounds normal. No respiratory distress. Abdominal: Soft.  There is no tenderness. Skin: eczematous patch on mid abdomen  Psychiatric: Patient has a normal mood and affect. behavior is normal. Judgment and thought content normal.   PHQ2/9: Depression screen  [facility] 2/9 11/20/2020 09/03/2020 05/22/2020 02/20/2020 01/16/2020  Decreased Interest 0 0 0 0 0  Down, Depressed, Hopeless 0 0 0 0 0  PHQ - 2 Score 0 0 0 0 0  Altered sleeping 0 0 - 0 0  Tired, decreased energy 0 0 - 0 0  Change in appetite 0 0 - 0 0  Feeling bad or failure about yourself  0 0 - 0 0  Trouble concentrating 0 0 - 0 0  Moving slowly or fidgety/restless 0 0 - 0 0  Suicidal thoughts 0 0 - 0 0  PHQ-9 Score 0 0 - 0 0  Difficult doing work/chores - - - - -  Some recent data might be hidden    phq 9 is negative   Fall Risk: Fall Risk  11/20/2020 09/03/2020 05/22/2020 02/20/2020 01/16/2020  Falls in the past year? 0 0 0 0 0  Number falls in past yr: 0 0 0 0 0  Injury with Fall? 0 0 0 0 0  Risk for fall due to : No Fall Risks - - - -  Follow up Falls prevention discussed - - - -      Functional Status Survey: Is the patient deaf or have difficulty hearing?: No Does the patient have difficulty seeing, even when wearing glasses/contacts?: No Does the patient have difficulty concentrating, remembering, or  making decisions?: No Does the patient have difficulty walking or climbing stairs?: No Does the patient have difficulty dressing or bathing?: No Does the patient have difficulty doing errands alone such as visiting a doctor's office or shopping?: No    Assessment & Plan  1. Major depression in remission (HCC)  - venlafaxine XR (EFFEXOR-XR) 37.5 MG 24 hr capsule; Take 1 capsule (37.5 mg total) by mouth daily with breakfast.  Dispense: 90 capsule; Refill: 0  2. Obesity (BMI 30.0-34.9)   3. Hypertension, benign  - atenolol (TENORMIN) 25 MG tablet; Take 1 tablet (25 mg total) by mouth daily.  Dispense: 90 tablet; Refill: 1  4. Vitamin D deficiency   5. Prediabetes   6. Mild intermittent asthma, uncomplicated  - montelukast (SINGULAIR) 10 MG tablet; TAKE 1 TABLET BY MOUTH EVERYDAY AT BEDTIME  Dispense: 90 tablet; Refill: 1  7. Perennial allergic rhinitis with seasonal variation  - fluticasone (FLONASE) 50 MCG/ACT nasal spray; Place 2 sprays into both nostrils at bedtime.  Dispense: 48 g; Refill: 1 - loratadine (CLARITIN) 10 MG tablet; Take 1 tablet (10 mg total) by mouth daily.  Dispense: 90 tablet; Refill: 1  8. Flexural eczema  - desoximetasone (TOPICORT) 0.25 % cream; Apply 1 application topically 2 (two) times daily.  Dispense: 100 g; Refill: 0  9. Chronic constipation   10. Encounter for surveillance of contraceptive pills  - desogestrel-ethinyl estradiol (APRI) 0.15-30 MG-MCG tablet; Take 1 tablet by mouth daily.  Dispense: 84 tablet; Refill: 1  11. Insulin resistance  - Semaglutide,0.25 or 0.5MG /DOS, (OZEMPIC, 0.25 OR 0.5 MG/DOSE,) 2 MG/1.5ML SOPN; Inject 0.5 mg into the skin once a week.  Dispense: 4.5 mL; Refill: 1  12. Anxiety  - venlafaxine XR (EFFEXOR-XR) 37.5 MG 24 hr capsule; Take 1 capsule (37.5 mg total) by mouth daily with breakfast.  Dispense: 90 capsule; Refill: 0  13. Need for pneumococcal vaccine  - Pneumococcal conjugate vaccine 20-valent  (Prevnar 20)

## 2020-11-21 NOTE — Telephone Encounter (Signed)
PA started

## 2020-11-22 ENCOUNTER — Telehealth: Payer: Self-pay | Admitting: Family Medicine

## 2020-11-22 NOTE — Telephone Encounter (Signed)
Rachel Dunlap. With Covermymeds is calling regarding a PA for ozemic - requesting that the pt insurance is not able to locate the pt. Can this be resubmitted with pts telephone number and member id. Please advise CB- [phone]

## 2020-11-30 ENCOUNTER — Other Ambulatory Visit: Payer: Self-pay | Admitting: Family Medicine

## 2020-11-30 DIAGNOSIS — A6004 Herpesviral vulvovaginitis: Secondary | ICD-10-CM

## 2020-11-30 NOTE — Telephone Encounter (Signed)
Requested Prescriptions  Pending Prescriptions Disp Refills  . valACYclovir (VALTREX) 500 MG tablet [Pharmacy Med Name: VALACYCLOVIR HCL 500 MG TABLET] 115 tablet 1    Sig: TAKE 1 TABLET (500 MG TOTAL) BY MOUTH DAILY. AND TWICE DAILY ON OUTBREAKS     Antimicrobials:  Antiviral Agents - Anti-Herpetic Passed - 11/30/2020 12:25 PM      Passed - Valid encounter within last 12 months    Recent Outpatient Visits          1 week ago Major depression in remission [facility])   [facility]ba Cory, MD   2 months ago Major depression in remission [facility])   [facility] Alba Cory, MD   6 months ago Major depression in remission [facility])   [facility] Alba Cory, MD   9 months ago Metabolic syndrome   [facility] Alba Cory, MD   10 months ago Class 2 severe obesity with serious comorbidity and body mass index (BMI) of 38.0 to 38.9 in adult, unspecified obesity type [facility])   [facility] Alba Cory, MD      Future Appointments            In 4 months Carlynn Purl, Danna Hefty, [facility]   In 5 months Alba Cory, MD [facility], [facility]

## 2020-12-04 ENCOUNTER — Other Ambulatory Visit (HOSPITAL_COMMUNITY)
Admission: RE | Admit: 2020-12-04 | Discharge: 2020-12-04 | Disposition: A | Discharge status: Home / Self Care | Payer: Federal, State, Local not specified - PPO | Source: Ambulatory Visit | Attending: Obstetrics & Gynecology | Admitting: Obstetrics & Gynecology

## 2020-12-04 ENCOUNTER — Other Ambulatory Visit: Payer: Self-pay

## 2020-12-04 ENCOUNTER — Encounter: Payer: Self-pay | Admitting: Obstetrics & Gynecology

## 2020-12-04 ENCOUNTER — Ambulatory Visit (INDEPENDENT_AMBULATORY_CARE_PROVIDER_SITE_OTHER): Payer: 59 | Admitting: Obstetrics & Gynecology

## 2020-12-04 VITALS — BP 138/80 | Ht 67.5 in | Wt 222.0 lb

## 2020-12-04 DIAGNOSIS — N871 Moderate cervical dysplasia: Secondary | ICD-10-CM

## 2020-12-04 NOTE — Progress Notes (Signed)
HPI:  Patient is a 25 y.o. G0P0000 presenting for follow up evaluation of abnormal PAP smear in the past.  Her last PAP was 8 months ago and was normal. Before that, she had a PAP that was ASC-H. She has had a prior colposcopy. Prior biopsies (if done) were CIN II.  This was subsequently treated w CRYO to the cervix in August 2021.  PMHx: She  has a past medical history of Acne, Allergy, Anxiety, Asthma, Chronic constipation, Chronic dermatitis, Constipation, Depression, Dermatitis, Dysmetabolic syndrome, Genital herpes, Hypertension, Irritable bowel syndrome without diarrhea, Kidney disease, Morbid obesity with BMI of 40.0-44.9, adult (HCC), Panic disorder, PCOS (polycystic ovarian syndrome), Pre-diabetes, Proteinuria, Stomach ulcer, and Vitamin D deficiency. Also,  has no past surgical history on file., family history includes Cancer in her father; Diabetes in her father; Hypertension in her mother; Kidney disease in her father; Obesity in her maternal grandmother and mother.,  reports that she has never smoked. She has never used smokeless tobacco. She reports current alcohol use. She reports that she does not use drugs.  She has a current medication list which includes the following prescription(s): albuterol, atenolol, omron 5 series bp monitor, desogestrel-ethinyl estradiol, desoximetasone, enalapril, fluticasone, ketoconazole, loratadine, montelukast, nystatin, polyethylene glycol powder, ozempic (0.25 or 0.5 mg/dose), triamcinolone cream, valacyclovir, and venlafaxine xr. Also, has No Known Allergies.  Review of Systems  All other systems reviewed and are negative.  Objective: BP 138/80   Ht 5' 7.5" (1.715 m)   Wt 222 lb (100.7 kg)   LMP 11/27/2020   BMI 34.26 kg/m  Filed Weights   12/04/20 0815  Weight: 222 lb (100.7 kg)   Body mass index is 34.26 kg/m.  Physical examination Physical Exam Constitutional:      General: She is not in acute distress.    Appearance: She is  well-developed.  Genitourinary:     Right Labia: No rash or tenderness.    Left Labia: No tenderness or rash.    No vaginal erythema or bleeding.      Right Adnexa: not tender and no mass present.    Left Adnexa: not tender and no mass present.    No cervical motion tenderness, discharge, polyp or nabothian cyst.     Uterus is not enlarged.     No uterine mass detected.    Uterus is anteverted.     Pelvic exam was performed with patient in the lithotomy position.  HENT:     Head: Normocephalic and atraumatic.     Nose: Nose normal.  Abdominal:     General: There is no distension.     Palpations: Abdomen is soft.     Tenderness: There is no abdominal tenderness.  Musculoskeletal:        General: Normal range of motion.  Neurological:     Mental Status: She is alert and oriented to person, place, and time.     Cranial Nerves: No cranial nerve deficit.  Skin:    General: Skin is warm and dry.  Psychiatric:        Attention and Perception: Attention normal.        Mood and Affect: Mood and affect normal.        Speech: Speech normal.        Behavior: Behavior normal.        Thought Content: Thought content normal.        Judgment: Judgment normal.    ASSESSMENT:  History of Cervical Dysplasia  Plan:  1.  I discussed the grading system of pap smears and HPV high risk viral types.   2. Follow up PAP 6 months if abnormal or 12 mos if normal this time, vs intervention if high grade dysplasia identified.   A total of 20 minutes were spent face-to-face with the patient as well as preparation, review, communication, and documentation during this encounter.    Annamarie Major, MD, [facility], [facility] 12/04/2020  8:42 AM

## 2020-12-10 LAB — CYTOLOGY - PAP
Adequacy: ABSENT
Diagnosis: NEGATIVE

## 2021-04-12 ENCOUNTER — Other Ambulatory Visit: Payer: Self-pay | Admitting: Family Medicine

## 2021-04-12 DIAGNOSIS — A6004 Herpesviral vulvovaginitis: Secondary | ICD-10-CM

## 2021-04-12 NOTE — Telephone Encounter (Signed)
Requested Prescriptions  Pending Prescriptions Disp Refills   valACYclovir (VALTREX) 500 MG tablet [Pharmacy Med Name: VALACYCLOVIR HCL 500 MG TABLET] 115 tablet 1    Sig: TAKE 1 TABLET (500 MG TOTAL) BY MOUTH DAILY. AND TWICE DAILY ON OUTBREAKS     Antimicrobials:  Antiviral Agents - Anti-Herpetic Passed - 04/12/2021  9:19 AM      Passed - Valid encounter within last 12 months    Recent Outpatient Visits          4 months ago Major depression in remission [facility])   [facility], Drue Stager, MD   7 months ago Major depression in remission [facility])   [facility] Steele Sizer, MD   10 months ago Major depression in remission [facility])   [facility] Steele Sizer, MD   1 year ago Metabolic syndrome   [facility] Steele Sizer, MD   1 year ago Class 2 severe obesity with serious comorbidity and body mass index (BMI) of 38.0 to 38.9 in adult, unspecified obesity type [facility])   [facility] Steele Sizer, MD      Future Appointments            In 1 week Steele Sizer, MD [facility] [place]   In 1 month Steele Sizer, MD [facility], [facility]

## 2021-04-22 NOTE — Progress Notes (Signed)
Name: Rachel Dunlap   MRN: [id number]    DOB: 04-08-1995   Date:04/23/2021       Progress Note  Subjective  Chief Complaint  Annual Exam  HPI  Patient presents for annual CPE.  Diet: portion control, she is in Health and safety inspector school and has to try the meals, but tries to eat portions Exercise: she states doing a biggest loser challenge with her friend and her goal is to be more active and lose 50 lbs. Explained I will be happy if she breaks 200 lbs and is able to maintain her weight    [place] [facility] Visit from 05/22/2020 in [facility]  AUDIT-C Score 0      Depression: Phq 9 is  negative Depression screen [facility] 2/9 04/23/2021 11/20/2020 09/03/2020 05/22/2020 02/20/2020  Decreased Interest 0 0 0 0 0  Down, Depressed, Hopeless 0 0 0 0 0  PHQ - 2 Score 0 0 0 0 0  Altered sleeping 0 0 0 - 0  Tired, decreased energy 0 0 0 - 0  Change in appetite 0 0 0 - 0  Feeling bad or failure about yourself  0 0 0 - 0  Trouble concentrating 0 0 0 - 0  Moving slowly or fidgety/restless 0 0 0 - 0  Suicidal thoughts 0 0 0 - 0  PHQ-9 Score 0 0 0 - 0  Difficult doing work/chores - - - - -  Some recent data might be hidden   Hypertension: BP Readings from Last 3 Encounters:  04/23/21 136/82  12/04/20 138/80  11/20/20 116/64   Obesity: Wt Readings from Last 3 Encounters:  04/23/21 230 lb (104.3 kg)  12/04/20 222 lb (100.7 kg)  11/20/20 223 lb (101.2 kg)   BMI Readings from Last 3 Encounters:  04/23/21 34.97 kg/m  12/04/20 34.26 kg/m  11/20/20 34.93 kg/m     Vaccines:  HPV: up to date Pneumonia: educated and discussed with patient. Flu: today   Hep C Screening: 09/22/18 STD testing and prevention (HIV/chl/gon/syphilis): 09/25/19 Intimate partner violence: negative Sexual History :not currently  Menstrual History/LMP/Abnormal Bleeding: on ocp and regular because of that , she has a history of PCOS and used to have oligomenorrhea. Lasts 3-4 days not very heavy   Incontinence Symptoms: no problems   Breast cancer:  - Last Mammogram: N/A - BRCA gene screening: N/A  Osteoporosis prevention : Discussed high calcium and vitamin D supplementation, weight bearing exercises  Cervical cancer screening: 12/04/20 - Dr. Kenton Kingfisher and it was normal   Skin cancer: Discussed monitoring for atypical lesions  ECG: 01/06/18  Advanced Care Planning: A voluntary discussion about advance care planning including the explanation and discussion of advance directives.  Discussed health care proxy and Living will, and the patient was able to identify a health care proxy as mother .  Patient does not have a living will at present time.  Lipids: Lab Results  Component Value Date   CHOL 169 09/25/2019   CHOL 162 09/22/2018   CHOL 182 10/19/2017   Lab Results  Component Value Date   HDL 71 09/25/2019   HDL 57 09/22/2018   HDL 58 10/19/2017   Lab Results  Component Value Date   LDLCALC 84 09/25/2019   LDLCALC 84 09/22/2018   LDLCALC 103 (H) 10/19/2017   Lab Results  Component Value Date   TRIG 60 09/25/2019   TRIG 109 09/22/2018   TRIG 117 10/19/2017   Lab Results  Component Value Date   CHOLHDL 2.4  09/25/2019   CHOLHDL 2.8 09/22/2018   CHOLHDL 3.1 10/19/2017   No results found for: LDLDIRECT  Glucose: Glucose  Date Value Ref Range Status  03/28/2018 92 65 - 99 mg/dL Final   Glucose, Bld  Date Value Ref Range Status  09/25/2019 91 65 - 99 mg/dL Final    Comment:    .            Fasting reference interval .   09/22/2018 78 65 - 99 mg/dL Final    Comment:    .            Fasting reference interval .   10/19/2017 82 65 - 99 mg/dL Final    Comment:    .            Fasting reference interval .     Patient Active Problem List   Diagnosis Date Noted   Eczema 08/31/2018   Insulin resistance 05/16/2018   Prediabetes 02/14/2018   Essential hypertension 02/14/2018   Moderate recurrent major depression ([place]) 02/16/2017   Back pain due to  injury 09/18/2016   Acne vulgaris 09/16/2015   Hypertrichosis 09/16/2015   Chronic constipation 11/09/2014   Chronic dermatitis 23/55/7322   Dysmetabolic syndrome 02/54/2706   Hypertension, benign 11/09/2014   Genital herpes in women 11/09/2014   Mild intermittent asthma 11/09/2014   Allergic rhinitis, seasonal 11/09/2014   Class 2 severe obesity with serious comorbidity and body mass index (BMI) of 38.0 to 38.9 in adult ([place]) 11/09/2014   IBS (irritable bowel syndrome) 11/09/2014   Bilateral polycystic ovarian syndrome 11/09/2014   Abnormal presence of protein in urine 11/09/2014   Vitamin D deficiency 11/09/2014   Anxiety 11/09/2014    No past surgical history on file.  Family History  Problem Relation Age of Onset   Hypertension Mother    Obesity Mother    Cancer Father    Diabetes Father    Kidney disease Father    Obesity Maternal Grandmother     Social History   Socioeconomic History   Marital status: Single    Spouse name: Not on file   Number of children: 0   Years of education: Not on file   Highest education level: Some college, no degree  Occupational History   Occupation: gas station attendant.     Comment: [employer]  Tobacco Use   Smoking status: Never   Smokeless tobacco: Never  Vaping Use   Vaping Use: Never used  Substance and Sexual Activity   Alcohol use: Yes    Alcohol/week: 0.0 standard drinks    Comment: seldom   Drug use: No   Sexual activity: Not Currently    Partners: Male    Birth control/protection: Pill, Abstinence  Other Topics Concern   Not on file  Social History Narrative   She wemt to school at [employer] - graduated Dec  2019 - hospitality and tourism management - she wants to be chef-goal is to work at a [employer].   She works part time at [employer]: Low Risk    Difficulty of Paying Living Expenses: Not hard at all  Food Insecurity: No Food Insecurity    Worried About Charity fundraiser in the Last Year: Never true   Arboriculturist in the Last Year: Never true  Transportation Needs: No Transportation Needs   Lack of Transportation (Medical): No   Lack of Transportation (Non-Medical):  No  Physical Activity: Insufficiently Active   Days of Exercise per Week: 3 days   Minutes of Exercise per Session: 40 min  Stress: No Stress Concern Present   Feeling of Stress : Not at all  Social Connections: Socially Isolated   Frequency of Communication with Friends and Family: Twice a week   Frequency of Social Gatherings with Friends and Family: Three times a week   Attends Religious Services: Never   Active Member of Clubs or Organizations: No   Attends Archivist Meetings: Never   Marital Status: Never married  Human resources officer Violence: Not At Risk   Fear of Current or Ex-Partner: No   Emotionally Abused: No   Physically Abused: No   Sexually Abused: No     Current Outpatient Medications:    albuterol (VENTOLIN HFA) 108 (90 Base) MCG/ACT inhaler, Inhale 2 puffs into the lungs every 6 (six) hours as needed for wheezing or shortness of breath., Disp: 6.7 each, Rfl: 0   atenolol (TENORMIN) 25 MG tablet, Take 1 tablet (25 mg total) by mouth daily., Disp: 90 tablet, Rfl: 1   Blood Pressure Monitoring (OMRON 5 SERIES BP MONITOR) DEVI, , Disp: , Rfl:    desoximetasone (TOPICORT) 0.25 % cream, Apply 1 application topically 2 (two) times daily., Disp: 100 g, Rfl: 0   enalapril (VASOTEC) 5 MG tablet, Take 1 tablet by mouth daily., Disp: , Rfl:    fluticasone (FLONASE) 50 MCG/ACT nasal spray, Place 2 sprays into both nostrils at bedtime., Disp: 48 g, Rfl: 1   ketoconazole (NIZORAL) 2 % cream, APPLY 1 APPLICATION TOPICALLY AT BEDTIME., Disp: 60 g, Rfl: 0   loratadine (CLARITIN) 10 MG tablet, Take 1 tablet (10 mg total) by mouth daily., Disp: 90 tablet, Rfl: 1   montelukast (SINGULAIR) 10 MG tablet, TAKE 1 TABLET BY MOUTH EVERYDAY AT BEDTIME,  Disp: 90 tablet, Rfl: 1   polyethylene glycol powder (GLYCOLAX/MIRALAX) 17 GM/SCOOP powder, Take 17 g by mouth 2 (two) times daily as needed., Disp: 3350 g, Rfl: 1   Semaglutide,0.25 or 0.5MG/DOS, (OZEMPIC, 0.25 OR 0.5 MG/DOSE,) 2 MG/1.5ML SOPN, Inject 0.5 mg into the skin once a week., Disp: 4.5 mL, Rfl: 1   triamcinolone cream (KENALOG) 0.1 %, Apply 1 application topically 2 (two) times daily., Disp: 45 g, Rfl: 0   valACYclovir (VALTREX) 500 MG tablet, TAKE 1 TABLET (500 MG TOTAL) BY MOUTH DAILY. AND TWICE DAILY ON OUTBREAKS, Disp: 115 tablet, Rfl: 1   desogestrel-ethinyl estradiol (APRI) 0.15-30 MG-MCG tablet, Take 1 tablet by mouth daily., Disp: 84 tablet, Rfl: 3  No Known Allergies   ROS  Constitutional: Negative for fever , positive for weight change.  Respiratory: Negative for cough and shortness of breath.   Cardiovascular: Negative for chest pain or palpitations.  Gastrointestinal: Negative for abdominal pain, no bowel changes.  Musculoskeletal: Negative for gait problem or joint swelling.  Skin: Negative for rash.  Neurological: Negative for dizziness or headache.  No other specific complaints in a complete review of systems (except as listed in HPI above).   Objective  Vitals:   04/23/21 0808  BP: 136/82  Pulse: 93  Resp: 16  Temp: 98.1 F (36.7 C)  SpO2: 99%  Weight: 230 lb (104.3 kg)  Height: 5' 8"  (1.727 m)    Body mass index is 34.97 kg/m.  Physical Exam  Constitutional: Patient appears well-developed and well-nourished. No distress.  HENT: Head: Normocephalic and atraumatic. Ears: B TMs ok, no erythema or effusion; Nose: Not  done Mouth/Throat: not done  Eyes: Conjunctivae and EOM are normal. Pupils are equal, round, and reactive to light. No scleral icterus.  Neck: Normal range of motion. Neck supple. No JVD present. No thyromegaly present.  Cardiovascular: Normal rate, regular rhythm and normal heart sounds.  No murmur heard. No BLE  edema. Pulmonary/Chest: Effort normal and breath sounds normal. No respiratory distress. Abdominal: Soft. Bowel sounds are normal, no distension. There is no tenderness. no masses Breast: no lumps or masses, no nipple discharge or rashes FEMALE GENITALIA:  Not done  RECTAL: not done  Musculoskeletal: Normal range of motion, no joint effusions. No gross deformities Neurological: he is alert and oriented to person, place, and time. No cranial nerve deficit. Coordination, balance, strength, speech and gait are normal.  Skin: Skin is warm and dry. No rash noted. No erythema.  Psychiatric: Patient has a normal mood and affect. behavior is normal. Judgment and thought content normal.   Fall Risk: Fall Risk  04/23/2021 11/20/2020 09/03/2020 05/22/2020 02/20/2020  Falls in the past year? 0 0 0 0 0  Number falls in past yr: 0 0 0 0 0  Injury with Fall? 0 0 0 0 0  Risk for fall due to : No Fall Risks No Fall Risks - - -  Follow up Falls prevention discussed Falls prevention discussed - - -     Functional Status Survey: Is the patient deaf or have difficulty hearing?: No Does the patient have difficulty seeing, even when wearing glasses/contacts?: No Does the patient have difficulty concentrating, remembering, or making decisions?: No Does the patient have difficulty walking or climbing stairs?: No Does the patient have difficulty dressing or bathing?: No Does the patient have difficulty doing errands alone such as visiting a doctor's office or shopping?: No   Assessment & Plan  1. Well adult exam  - Lipid panel - CBC with Differential/Platelet - COMPLETE METABOLIC PANEL WITH GFR - Vitamin B12 - VITAMIN D 25 Hydroxy (Vit-D Deficiency, Fractures) - Hemoglobin A1c  2. Need for immunization against influenza  - Flu Vaccine QUAD 6+ mos PF IM (Fluarix Quad PF)  3. Vitamin D deficiency  - VITAMIN D 25 Hydroxy (Vit-D Deficiency, Fractures)  4. B12 deficiency  - Vitamin B12  5.  Long-term use of high-risk medication  - CBC with Differential/Platelet - COMPLETE METABOLIC PANEL WITH GFR  6. Prediabetes  - Hemoglobin A1c  7. Lipid screening  - Lipid panel  8. Screening for deficiency anemia  - CBC with Differential/Platelet  9. Encounter for surveillance of contraceptive pills  - desogestrel-ethinyl estradiol (APRI) 0.15-30 MG-MCG tablet; Take 1 tablet by mouth daily.  Dispense: 84 tablet; Refill: 3   -USPSTF grade A and B recommendations reviewed with patient; age-appropriate recommendations, preventive care, screening tests, etc discussed and encouraged; healthy living encouraged; see AVS for patient education given to patient -Discussed importance of 150 minutes of physical activity weekly, eat two servings of fish weekly, eat one serving of tree nuts ( cashews, pistachios, pecans, almonds.Marland Kitchen) every other day, eat 6 servings of fruit/vegetables daily and drink plenty of water and avoid sweet beverages.

## 2021-04-23 ENCOUNTER — Ambulatory Visit (INDEPENDENT_AMBULATORY_CARE_PROVIDER_SITE_OTHER): Payer: 59 | Admitting: Family Medicine

## 2021-04-23 ENCOUNTER — Encounter: Payer: Self-pay | Admitting: Family Medicine

## 2021-04-23 VITALS — BP 136/82 | HR 93 | Temp 98.1°F | Resp 16 | Ht 68.0 in | Wt 230.0 lb

## 2021-04-23 DIAGNOSIS — Z23 Encounter for immunization: Secondary | ICD-10-CM

## 2021-04-23 DIAGNOSIS — Z79899 Other long term (current) drug therapy: Secondary | ICD-10-CM

## 2021-04-23 DIAGNOSIS — E559 Vitamin D deficiency, unspecified: Secondary | ICD-10-CM | POA: Diagnosis not present

## 2021-04-23 DIAGNOSIS — Z3041 Encounter for surveillance of contraceptive pills: Secondary | ICD-10-CM

## 2021-04-23 DIAGNOSIS — R7303 Prediabetes: Secondary | ICD-10-CM

## 2021-04-23 DIAGNOSIS — E538 Deficiency of other specified B group vitamins: Secondary | ICD-10-CM

## 2021-04-23 DIAGNOSIS — Z0000 Encounter for general adult medical examination without abnormal findings: Secondary | ICD-10-CM

## 2021-04-23 DIAGNOSIS — Z130 Encounter for screening for diseases of the blood and blood-forming organs and certain disorders involving the immune mechanism: Secondary | ICD-10-CM

## 2021-04-23 DIAGNOSIS — Z13220 Encounter for screening for lipoid disorders: Secondary | ICD-10-CM

## 2021-04-23 MED ORDER — DESOGESTREL-ETHINYL ESTRADIOL 0.15-30 MG-MCG PO TABS: 1.0000 | ORAL_TABLET | Freq: Every day | ORAL | 3 refills | Status: DC

## 2021-04-23 NOTE — Patient Instructions (Signed)
Preventive Care 21-28 Years Old, Female °Preventive care refers to lifestyle choices and visits with your health care provider that can promote health and wellness. Preventive care visits are also called wellness exams. °What can I expect for my preventive care visit? °Counseling °During your preventive care visit, your health care provider may ask about your: °Medical history, including: °Past medical problems. °Family medical history. °Pregnancy history. °Current health, including: °Menstrual cycle. °Method of birth control. °Emotional well-being. °Home life and relationship well-being. °Sexual activity and sexual health. °Lifestyle, including: °Alcohol, nicotine or tobacco, and drug use. °Access to firearms. °Diet, exercise, and sleep habits. °Work and work environment. °Sunscreen use. °Safety issues such as seatbelt and bike helmet use. °Physical exam °Your health care provider may check your: °Height and weight. These may be used to calculate your BMI (body mass index). BMI is a measurement that tells if you are at a healthy weight. °Waist circumference. This measures the distance around your waistline. This measurement also tells if you are at a healthy weight and may help predict your risk of certain diseases, such as type 2 diabetes and high blood pressure. °Heart rate and blood pressure. °Body temperature. °Skin for abnormal spots. °What immunizations do I need? °Vaccines are usually given at various ages, according to a schedule. Your health care provider will recommend vaccines for you based on your age, medical history, and lifestyle or other factors, such as travel or where you work. °What tests do I need? °Screening °Your health care provider may recommend screening tests for certain conditions. This may include: °Pelvic exam and Pap test. °Lipid and cholesterol levels. °Diabetes screening. This is done by checking your blood sugar (glucose) after you have not eaten for a while (fasting). °Hepatitis B  test. °Hepatitis C test. °HIV (human immunodeficiency virus) test. °STI (sexually transmitted infection) testing, if you are at risk. °BRCA-related cancer screening. This may be done if you have a family history of breast, ovarian, tubal, or peritoneal cancers. °Talk with your health care provider about your test results, treatment options, and if necessary, the need for more tests. °Follow these instructions at home: °Eating and drinking ° °Eat a healthy diet that includes fresh fruits and vegetables, whole grains, lean protein, and low-fat dairy products. °Take vitamin and mineral supplements as recommended by your health care provider. °Do not drink alcohol if: °Your health care provider tells you not to drink. °You are pregnant, may be pregnant, or are planning to become pregnant. °If you drink alcohol: °Limit how much you have to 0-1 drink a day. °Know how much alcohol is in your drink. In the [place] one drink equals one 12 oz bottle of beer (355 mL), one 5 oz glass of wine (148 mL), or one 1½ oz glass of hard liquor (44 mL). °Lifestyle °Brush your teeth every morning and night with fluoride toothpaste. Floss one time each day. °Exercise for at least 30 minutes 5 or more days each week. °Do not use any products that contain nicotine or tobacco. These products include cigarettes, chewing tobacco, and vaping devices, such as e-cigarettes. If you need help quitting, ask your health care provider. °Do not use drugs. °If you are sexually active, practice safe sex. Use a condom or other form of protection to prevent STIs. °If you do not wish to become pregnant, use a form of birth control. If you plan to become pregnant, see your health care provider for a prepregnancy visit. °Find healthy ways to manage stress, such as: °Meditation, yoga,   or listening to music. °Journaling. °Talking to a trusted person. °Spending time with friends and family. °Minimize exposure to UV radiation to reduce your risk of skin  cancer. °Safety °Always wear your seat belt while driving or riding in a vehicle. °Do not drive: °If you have been drinking alcohol. Do not ride with someone who has been drinking. °If you have been using any mind-altering substances or drugs. °While texting. °When you are tired or distracted. °Wear a helmet and other protective equipment during sports activities. °If you have firearms in your house, make sure you follow all gun safety procedures. °Seek help if you have been physically or sexually abused. °What's next? °Go to your health care provider once a year for an annual wellness visit. °Ask your health care provider how often you should have your eyes and teeth checked. °Stay up to date on all vaccines. °This information is not intended to replace advice given to you by your health care provider. Make sure you discuss any questions you have with your health care provider. °Document Revised: 09/18/2020 Document Reviewed: 09/18/2020 °Elsevier Patient Education © 2022 [employer] ° °

## 2021-04-24 LAB — COMPLETE METABOLIC PANEL WITH GFR
AG Ratio: 1.3 (calc) (ref 1.0–2.5)
ALT: 9 U/L (ref 6–29)
AST: 14 U/L (ref 10–30)
Albumin: 4 g/dL (ref 3.6–5.1)
Alkaline phosphatase (APISO): 50 U/L (ref 31–125)
BUN: 12 mg/dL (ref 7–25)
CO2: 23 mmol/L (ref 20–32)
Calcium: 9.5 mg/dL (ref 8.6–10.2)
Chloride: 109 mmol/L (ref 98–110)
Creat: 0.88 mg/dL (ref 0.50–0.96)
Globulin: 3.2 g/dL (calc) (ref 1.9–3.7)
Glucose, Bld: 82 mg/dL (ref 65–99)
Potassium: 4.3 mmol/L (ref 3.5–5.3)
Sodium: 139 mmol/L (ref 135–146)
Total Bilirubin: 0.5 mg/dL (ref 0.2–1.2)
Total Protein: 7.2 g/dL (ref 6.1–8.1)
eGFR: 92 mL/min/{1.73_m2} (ref >=60)

## 2021-04-24 LAB — CBC WITH DIFFERENTIAL/PLATELET
Absolute Monocytes: 800 cells/uL (ref 200–950)
Basophils Absolute: 74 cells/uL (ref 0–200)
Basophils Relative: 0.8 %
Eosinophils Absolute: 322 cells/uL (ref 15–500)
Eosinophils Relative: 3.5 %
HCT: 36.3 % (ref 35.0–45.0)
Hemoglobin: 12.1 g/dL (ref 11.7–15.5)
Lymphs Abs: 2263 cells/uL (ref 850–3900)
MCH: 28.1 pg (ref 27.0–33.0)
MCHC: 33.3 g/dL (ref 32.0–36.0)
MCV: 84.4 fL (ref 80.0–100.0)
MPV: 11.4 fL (ref 7.5–12.5)
Monocytes Relative: 8.7 %
Neutro Abs: 5741 cells/uL (ref 1500–7800)
Neutrophils Relative %: 62.4 %
Platelets: 207 10*3/uL (ref 140–400)
RBC: 4.3 10*6/uL (ref 3.80–5.10)
RDW: 12.8 % (ref 11.0–15.0)
Total Lymphocyte: 24.6 %
WBC: 9.2 10*3/uL (ref 3.8–10.8)

## 2021-04-24 LAB — LIPID PANEL
Cholesterol: 206 mg/dL — ABNORMAL HIGH (ref <200)
HDL: 72 mg/dL (ref >=50)
LDL Cholesterol (Calc): 116 mg/dL (calc) — ABNORMAL HIGH
Non-HDL Cholesterol (Calc): 134 mg/dL (calc) — ABNORMAL HIGH (ref <130)
Total CHOL/HDL Ratio: 2.9 (calc) (ref <5.0)
Triglycerides: 79 mg/dL (ref <150)

## 2021-04-24 LAB — HEMOGLOBIN A1C
Hgb A1c MFr Bld: 5.2 % of total Hgb (ref <5.7)
Mean Plasma Glucose: 103 mg/dL
eAG (mmol/L): 5.7 mmol/L

## 2021-04-24 LAB — VITAMIN B12: Vitamin B-12: 315 pg/mL (ref 200–1100)

## 2021-04-24 LAB — VITAMIN D 25 HYDROXY (VIT D DEFICIENCY, FRACTURES): Vit D, 25-Hydroxy: 19 ng/mL — ABNORMAL LOW (ref 30–100)

## 2021-05-21 ENCOUNTER — Ambulatory Visit: Payer: 59 | Admitting: Family Medicine

## 2021-06-04 ENCOUNTER — Encounter: Payer: Self-pay | Admitting: Family Medicine

## 2021-06-05 ENCOUNTER — Other Ambulatory Visit: Payer: Self-pay

## 2021-06-05 DIAGNOSIS — I10 Essential (primary) hypertension: Secondary | ICD-10-CM

## 2021-06-05 MED ORDER — ATENOLOL 25 MG PO TABS: 25.0000 mg | ORAL_TABLET | Freq: Every day | ORAL | 1 refills | Status: DC

## 2021-06-24 ENCOUNTER — Encounter: Payer: Self-pay | Admitting: Family Medicine

## 2021-08-12 NOTE — Progress Notes (Signed)
Name: Rachel Dunlap   MRN: [id number]    DOB: 14-Dec-1995   Date:08/13/2021 ? ?     Progress Note ? ?Subjective ? ?Chief Complaint ? ?Follow Up ? ?HPI ? ?Obesity: She has a long history of obesity ( since childhood) max weight was 265 lbs . started with live life style modification and in 11/2019 her weight was 248 lbs started on Ozempic 02/2020 for insulin resistance and obesity, her weight was 236 lbs at the time with a BMI of 37. 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 Weight today is down to 221 lbs ?  ?Depression Major: she is back in therapy with [facility] since Summer 2021 , visits are now every two weeks She was taking Effexor but stopped months ago and is doing well.Marland Kitchen Phq 9 today is zero.  ?  ?HTN: today bp is up but she did not take bp medications before she left the house  she sees  Dr. Ronn Melena /nephrologist once a year. She is taking vasotec because of microalbuminuria and also Atenolol.  She is aware of teratogenic effects of ACE  ?  ?Pre-diabetes: she denies polyphagia, polydipsia or polyuria. Fasting insulin was elevated, she has been physically active and eating healthy, last A1C was norma at 5.2 % l, she is still losing weight and taking Ozempic as prescribed  ?  ?AR: doing well at this time, taking medications, no rhinorrhea or congestion. Taking singulair and loratadine. She states symptoms flared over the weekend by having increase in rhinorrhea and sneezing due to spending the day outdoors.  ? ?Asthma: she is taking singulair daily, denies wheezing, cough or SOB. Doing well today. Discussed PCV 20  ? ?Genital herpes: she takes valtrex daily, she states last outbreak was years ago when she was first diagnosed.  Unchanged  ?  ?Constipation: long history of constipation,  doing well lately with change in diet,  eating more fiber, only taking Miralax prn , she noticed that when she stopped Miralax frequency went down to twice a week. ?  ?Eczema: uses Topicort prn currently no flares  ? ?ASCUS : in 09/2019 , seen by gyn , had a colposcopy and last pap smear done 11/2020 was back to normal  ? ?Patient Active Problem List  ? Diagnosis Date Noted  ? Eczema 08/31/2018  ? Insulin resistance 05/16/2018  ? Prediabetes 02/14/2018  ? Essential hypertension 02/14/2018  ? Moderate recurrent major depression (HCC) 02/16/2017  ? Back pain due to injury 09/18/2016  ? Acne vulgaris 09/16/2015  ? Hypertrichosis 09/16/2015  ? Chronic constipation 11/09/2014  ? Chronic dermatitis 11/09/2014  ? Dysmetabolic syndrome 11/09/2014  ? Hypertension, benign 11/09/2014  ? Genital herpes in women 11/09/2014  ? Mild intermittent asthma 11/09/2014  ? Allergic rhinitis, seasonal 11/09/2014  ? Class 2 severe obesity with serious comorbidity and body mass index (BMI) of 38.0 to 38.9 in adult [facility]) 11/09/2014  ? IBS (irritable bowel syndrome) 11/09/2014  ? Bilateral polycystic ovarian syndrome 11/09/2014  ? Abnormal presence of protein in urine 11/09/2014  ? Vitamin D deficiency 11/09/2014  ? Anxiety 11/09/2014  ? ? ?No past surgical history on file. ? ?Family History  ?Problem Relation Age of Onset  ? Hypertension Mother   ? Obesity Mother   ? Cancer Father   ?  Diabetes Father   ? Kidney disease Father   ? Obesity Maternal Grandmother   ? ? ?Social History  ? ?Tobacco Use  ? Smoking status: Never  ? Smokeless tobacco: Never  ?Substance Use Topics  ? Alcohol use: Yes  ?  Alcohol/week: 0.0 standard drinks  ?  Comment: seldom  ? ? ? ?Current Outpatient Medications:  ?  albuterol (VENTOLIN HFA) 108 (90 Base) MCG/ACT inhaler, Inhale 2 puffs into the lungs every 6 (six) hours as needed for wheezing or shortness of breath., Disp: 6.7 each, Rfl: 0 ?  atenolol (TENORMIN) 25 MG tablet, Take 1 tablet (25 mg total) by mouth daily., Disp: 90 tablet, Rfl: 1 ?  Blood  Pressure Monitoring (OMRON 5 SERIES BP MONITOR) DEVI, , Disp: , Rfl:  ?  desogestrel-ethinyl estradiol (APRI) 0.15-30 MG-MCG tablet, Take 1 tablet by mouth daily., Disp: 84 tablet, Rfl: 3 ?  desoximetasone (TOPICORT) 0.25 % cream, Apply 1 application topically 2 (two) times daily., Disp: 100 g, Rfl: 0 ?  enalapril (VASOTEC) 5 MG tablet, Take 1 tablet by mouth daily., Disp: , Rfl:  ?  fluticasone (FLONASE) 50 MCG/ACT nasal spray, Place 2 sprays into both nostrils at bedtime., Disp: 48 g, Rfl: 1 ?  ketoconazole (NIZORAL) 2 % cream, APPLY 1 APPLICATION TOPICALLY AT BEDTIME., Disp: 60 g, Rfl: 0 ?  loratadine (CLARITIN) 10 MG tablet, Take 1 tablet (10 mg total) by mouth daily., Disp: 90 tablet, Rfl: 1 ?  montelukast (SINGULAIR) 10 MG tablet, TAKE 1 TABLET BY MOUTH EVERYDAY AT BEDTIME, Disp: 90 tablet, Rfl: 1 ?  polyethylene glycol powder (GLYCOLAX/MIRALAX) 17 GM/SCOOP powder, Take 17 g by mouth 2 (two) times daily as needed., Disp: 3350 g, Rfl: 1 ?  Semaglutide,0.25 or 0.5MG /DOS, (OZEMPIC, 0.25 OR 0.5 MG/DOSE,) 2 MG/1.5ML SOPN, Inject 0.5 mg into the skin once a week., Disp: 4.5 mL, Rfl: 1 ?  triamcinolone cream (KENALOG) 0.1 %, Apply 1 application topically 2 (two) times daily., Disp: 45 g, Rfl: 0 ?  valACYclovir (VALTREX) 500 MG tablet, TAKE 1 TABLET (500 MG TOTAL) BY MOUTH DAILY. AND TWICE DAILY ON OUTBREAKS, Disp: 115 tablet, Rfl: 1 ? ?No Known Allergies ? ?I personally reviewed active problem list, medication list, allergies, family history, social history, health maintenance with the patient/caregiver today. ? ? ?ROS ? ?Constitutional: Negative for fever or weight change.  ?Respiratory: Negative for cough and shortness of breath.   ?Cardiovascular: Negative for chest pain or palpitations.  ?Gastrointestinal: Negative for abdominal pain, no bowel changes.  ?Musculoskeletal: Negative for gait problem or joint swelling.  ?Skin: Negative for rash.  ?Neurological: Negative for dizziness or headache.  ?No other specific  complaints in a complete review of systems (except as listed in HPI above).  ? ?Objective ? ?Vitals:  ? 08/13/21 0923  ?BP: (!) 152/86  ?Pulse: 91  ?Resp: 16  ?SpO2: 98%  ?Weight: 221 lb (100.2 kg)  ?Height: 5\' 7"  (1.702 m)  ? ? ?Body mass index is 34.61 kg/m?. ? ?Physical Exam ? ?Constitutional: Patient appears well-developed and well-nourished. Obese  No distress.  ?HEENT: head atraumatic, normocephalic, pupils equal and reactive to light, neck supple ?Cardiovascular: Normal rate, regular rhythm and normal heart sounds.  No murmur heard. No BLE edema. ?Pulmonary/Chest: Effort normal and breath sounds normal. No respiratory distress. ?Abdominal: Soft.  There is no tenderness. ?Psychiatric: Patient has a normal mood and affect. behavior is normal. Judgment and thought content normal.  ? ?PHQ2/9: ? ?  08/13/2021  ?  9:23 AM  04/23/2021  ?  8:07 AM 11/20/2020  ? 10:30 AM 09/03/2020  ? 10:49 AM 05/22/2020  ?  2:38 PM  ?Depression screen PHQ 2/9  ?Decreased Interest 0 0 0 0 0  ?Down, Depressed, Hopeless 0 0 0 0 0  ?PHQ - 2 Score 0 0 0 0 0  ?Altered sleeping 0 0 0 0   ?Tired, decreased energy 0 0 0 0   ?Change in appetite 0 0 0 0   ?Feeling bad or failure about yourself  0 0 0 0   ?Trouble concentrating 0 0 0 0   ?Moving slowly or fidgety/restless 0 0 0 0   ?Suicidal thoughts 0 0 0 0   ?PHQ-9 Score 0 0 0 0   ?  ?phq 9 is negative ? ? ?Fall Risk: ? ?  08/13/2021  ?  9:22 AM 04/23/2021  ?  8:07 AM 11/20/2020  ? 10:29 AM 09/03/2020  ? 10:49 AM 05/22/2020  ?  2:38 PM  ?Fall Risk   ?Falls in the past year? 0 0 0 0 0  ?Number falls in past yr: 0 0 0 0 0  ?Injury with Fall? 0 0 0 0 0  ?Risk for fall due to : No Fall Risks No Fall Risks No Fall Risks    ?Follow up Falls prevention discussed Falls prevention discussed Falls prevention discussed    ? ? ? ? ?Functional Status Survey: ?Is the patient deaf or have difficulty hearing?: No ?Does the patient have difficulty seeing, even when wearing glasses/contacts?: No ?Does the patient have  difficulty concentrating, remembering, or making decisions?: No ?Does the patient have difficulty walking or climbing stairs?: No ?Does the patient have difficulty dressing or bathing?: No ?Does the patient h

## 2021-08-13 ENCOUNTER — Ambulatory Visit (INDEPENDENT_AMBULATORY_CARE_PROVIDER_SITE_OTHER): Payer: 59 | Admitting: Family Medicine

## 2021-08-13 ENCOUNTER — Encounter: Payer: Self-pay | Admitting: Family Medicine

## 2021-08-13 VITALS — BP 142/80 | HR 91 | Resp 16 | Ht 67.0 in | Wt 221.0 lb

## 2021-08-13 DIAGNOSIS — J302 Other seasonal allergic rhinitis: Secondary | ICD-10-CM

## 2021-08-13 DIAGNOSIS — K5909 Other constipation: Secondary | ICD-10-CM

## 2021-08-13 DIAGNOSIS — E538 Deficiency of other specified B group vitamins: Secondary | ICD-10-CM

## 2021-08-13 DIAGNOSIS — F325 Major depressive disorder, single episode, in full remission: Secondary | ICD-10-CM

## 2021-08-13 DIAGNOSIS — E559 Vitamin D deficiency, unspecified: Secondary | ICD-10-CM

## 2021-08-13 DIAGNOSIS — L2082 Flexural eczema: Secondary | ICD-10-CM

## 2021-08-13 DIAGNOSIS — J3089 Other allergic rhinitis: Secondary | ICD-10-CM

## 2021-08-13 DIAGNOSIS — I10 Essential (primary) hypertension: Secondary | ICD-10-CM

## 2021-08-13 DIAGNOSIS — E669 Obesity, unspecified: Secondary | ICD-10-CM | POA: Diagnosis not present

## 2021-08-13 DIAGNOSIS — E66811 Obesity, class 1: Secondary | ICD-10-CM

## 2021-08-13 DIAGNOSIS — E8881 Metabolic syndrome: Secondary | ICD-10-CM

## 2021-08-13 DIAGNOSIS — E88819 Insulin resistance, unspecified: Secondary | ICD-10-CM

## 2021-08-13 DIAGNOSIS — J301 Allergic rhinitis due to pollen: Secondary | ICD-10-CM

## 2021-08-13 DIAGNOSIS — Z23 Encounter for immunization: Secondary | ICD-10-CM

## 2021-08-13 DIAGNOSIS — R7303 Prediabetes: Secondary | ICD-10-CM

## 2021-08-13 DIAGNOSIS — J4520 Mild intermittent asthma, uncomplicated: Secondary | ICD-10-CM

## 2021-08-13 MED ORDER — POLYETHYLENE GLYCOL 3350 17 GM/SCOOP PO POWD: 17.0000 g | Freq: Two times a day (BID) | ORAL | 1 refills | Status: DC | PRN

## 2021-08-13 MED ORDER — LORATADINE 10 MG PO TABS: 10.0000 mg | ORAL_TABLET | Freq: Every day | ORAL | 1 refills | Status: DC

## 2021-08-13 MED ORDER — ALBUTEROL SULFATE HFA 108 (90 BASE) MCG/ACT IN AERS: 2.0000 | INHALATION_SPRAY | Freq: Four times a day (QID) | RESPIRATORY_TRACT | 0 refills | Status: DC | PRN

## 2021-08-13 MED ORDER — OZEMPIC (0.25 OR 0.5 MG/DOSE) 2 MG/1.5ML ~~LOC~~ SOPN: 0.5000 mg | PEN_INJECTOR | SUBCUTANEOUS | 1 refills | Status: DC

## 2021-08-13 MED ORDER — DESOXIMETASONE 0.25 % EX CREA: 1.0000 "application " | TOPICAL_CREAM | Freq: Two times a day (BID) | CUTANEOUS | 0 refills | Status: DC

## 2021-08-13 MED ORDER — MONTELUKAST SODIUM 10 MG PO TABS: ORAL_TABLET | ORAL | 1 refills | Status: DC

## 2021-08-13 NOTE — Assessment & Plan Note (Signed)
Doing well , currently just having therapy  ?

## 2021-08-13 NOTE — Assessment & Plan Note (Signed)
Advised PCV 20 today  ?

## 2021-08-13 NOTE — Assessment & Plan Note (Signed)
Doing well on Ozempic and life style modification  ?

## 2021-08-13 NOTE — Assessment & Plan Note (Signed)
stable °

## 2021-08-13 NOTE — Assessment & Plan Note (Signed)
She has been taking supplements since last visit  ?

## 2021-08-13 NOTE — Assessment & Plan Note (Signed)
Explained importance of taking medications daily at the same time ?

## 2021-08-13 NOTE — Assessment & Plan Note (Signed)
Advised to stay on miralax since more constipated  ?

## 2022-01-19 ENCOUNTER — Other Ambulatory Visit: Payer: Self-pay | Admitting: Family Medicine

## 2022-01-19 DIAGNOSIS — I10 Essential (primary) hypertension: Secondary | ICD-10-CM

## 2022-02-10 NOTE — Progress Notes (Deleted)
Name: Rachel Dunlap   MRN: [id number]    DOB: 04-21-1995   Date:02/10/2022       Progress Note  Subjective  Chief Complaint  Follow Up  HPI  Obesity: She has a long history of obesity ( since childhood) max weight was 265 lbs . started with live life style modification and in 11/2019 her weight was 248 lbs started on Ozempic 02/2020 for insulin resistance and obesity, her weight was 236 lbs at the time with a BMI of 37. 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    HTN: today bp is up but she did not take bp medications before she left the house  she sees  Dr. Abigail Butts /nephrologist once a year. She is taking vasotec because of microalbuminuria and also Atenolol.  She is aware of teratogenic effects of ACE    Pre-diabetes: she denies polyphagia, polydipsia or polyuria. Fasting insulin was elevated, she has been physically active and eating healthy, last A1C was norma at 5.2 % l, she is still losing weight and taking Ozempic as prescribed    AR: doing well at this time, taking medications, no rhinorrhea or congestion. Taking singulair and loratadine. She states symptoms flared over the weekend by having increase in rhinorrhea and sneezing due to spending the day outdoors.   Asthma: she is taking singulair daily, denies wheezing, cough or SOB. Doing well today. Discussed PCV 20   Genital herpes: she takes valtrex daily, she states last outbreak was years ago when she was first diagnosed.  Unchanged    Constipation: long history of constipation,  doing well lately with change in diet,  eating more fiber, only taking Miralax prn , she noticed that when she stopped Miralax frequency went down to twice a week.   Eczema: uses Topicort prn currently no flares   ASCUS : in 09/2019 , seen by gyn , had a colposcopy and last pap smear done 11/2020 was back to normal   Patient Active Problem List   Diagnosis Date Noted   B12 deficiency 08/13/2021   Obesity (BMI 30.0-34.9) 08/13/2021   Major depression in remission ([place]) 08/13/2021   Perennial allergic rhinitis with seasonal variation 08/13/2021   Eczema 08/31/2018   Insulin resistance 05/16/2018   Prediabetes 02/14/2018   Essential hypertension 02/14/2018   Back pain due to injury 09/18/2016   Acne vulgaris 09/16/2015   Hypertrichosis 09/16/2015   Chronic constipation 11/09/2014   Chronic dermatitis 18/29/9371   Metabolic syndrome 69/67/8938   Hypertension, benign 11/09/2014   Genital herpes in women 11/09/2014   Mild intermittent asthma, uncomplicated 01/20/5101   Allergic rhinitis, seasonal 11/09/2014   IBS (irritable bowel syndrome) 11/09/2014   Bilateral polycystic ovarian syndrome 11/09/2014   Abnormal presence of protein in urine 11/09/2014   Vitamin D deficiency 11/09/2014   Anxiety 11/09/2014    No past surgical history on file.  Family History  Problem Relation Age of Onset   Hypertension Mother    Obesity Mother    Cancer Father  Diabetes Father    Kidney disease Father    Obesity Maternal Grandmother     Social History   Tobacco Use   Smoking status: Never   Smokeless tobacco: Never  Substance Use Topics   Alcohol use: Yes    Alcohol/week: 0.0 standard drinks of alcohol    Comment: seldom     Current Outpatient Medications:    albuterol (VENTOLIN HFA) 108 (90 Base) MCG/ACT inhaler, Inhale 2 puffs into the lungs every 6 (six) hours as needed for wheezing or shortness of breath., Disp: 6.7 each, Rfl: 0   atenolol (TENORMIN) 25 MG tablet, TAKE 1 TABLET (25 MG TOTAL) BY MOUTH DAILY., Disp:  90 tablet, Rfl: 0   Blood Pressure Monitoring (OMRON 5 SERIES BP MONITOR) DEVI, , Disp: , Rfl:    desogestrel-ethinyl estradiol (APRI) 0.15-30 MG-MCG tablet, Take 1 tablet by mouth daily., Disp: 84 tablet, Rfl: 3   desoximetasone (TOPICORT) 0.25 % cream, Apply 1 application. topically 2 (two) times daily., Disp: 100 g, Rfl: 0   enalapril (VASOTEC) 5 MG tablet, Take 1 tablet by mouth daily., Disp: , Rfl:    fluticasone (FLONASE) 50 MCG/ACT nasal spray, Place 2 sprays into both nostrils at bedtime., Disp: 48 g, Rfl: 1   ketoconazole (NIZORAL) 2 % cream, APPLY 1 APPLICATION TOPICALLY AT BEDTIME., Disp: 60 g, Rfl: 0   loratadine (CLARITIN) 10 MG tablet, Take 1 tablet (10 mg total) by mouth daily., Disp: 90 tablet, Rfl: 1   montelukast (SINGULAIR) 10 MG tablet, TAKE 1 TABLET BY MOUTH EVERYDAY AT BEDTIME, Disp: 90 tablet, Rfl: 1   polyethylene glycol powder (GLYCOLAX/MIRALAX) 17 GM/SCOOP powder, Take 17 g by mouth 2 (two) times daily as needed., Disp: 3350 g, Rfl: 1   Semaglutide,0.25 or 0.5MG /DOS, (OZEMPIC, 0.25 OR 0.5 MG/DOSE,) 2 MG/1.5ML SOPN, Inject 0.5 mg into the skin once a week., Disp: 4.5 mL, Rfl: 1   triamcinolone cream (KENALOG) 0.1 %, Apply 1 application topically 2 (two) times daily., Disp: 45 g, Rfl: 0   valACYclovir (VALTREX) 500 MG tablet, TAKE 1 TABLET (500 MG TOTAL) BY MOUTH DAILY. AND TWICE DAILY ON OUTBREAKS, Disp: 115 tablet, Rfl: 1  No Known Allergies  I personally reviewed active problem list, medication list, allergies, family history, social history, health maintenance with the patient/caregiver today.   ROS  ***  Objective  There were no vitals filed for this visit.  There is no height or weight on file to calculate BMI.  Physical Exam ***  No results found for this or any previous visit (from the past 2160 hour(s)).   PHQ2/9:    08/13/2021    9:23 AM 04/23/2021    8:07 AM 11/20/2020   10:30 AM 09/03/2020   10:49 AM 05/22/2020    2:38 PM  Depression screen  PHQ 2/9  Decreased Interest 0 0 0 0 0  Down, Depressed, Hopeless 0 0 0 0 0  PHQ - 2 Score 0 0 0 0 0  Altered sleeping 0 0 0 0   Tired, decreased energy 0 0 0 0   Change in appetite 0 0 0 0   Feeling bad or failure about yourself  0 0 0 0   Trouble concentrating 0 0 0 0   Moving slowly or fidgety/restless 0 0 0 0   Suicidal thoughts 0 0 0 0   PHQ-9 Score 0 0 0 0     phq 9 is {gen pos [id number]}   Fall Risk:    08/13/2021  9:22 AM 04/23/2021    8:07 AM 11/20/2020   10:29 AM 09/03/2020   10:49 AM 05/22/2020    2:38 PM  Fall Risk   Falls in the past year? 0 0 0 0 0  Number falls in past yr: 0 0 0 0 0  Injury with Fall? 0 0 0 0 0  Risk for fall due to : No Fall Risks No Fall Risks No Fall Risks    Follow up Falls prevention discussed Falls prevention discussed Falls prevention discussed        Functional Status Survey:      Assessment & Plan  *** There are no diagnoses linked to this encounter.

## 2022-02-11 ENCOUNTER — Ambulatory Visit: Payer: Self-pay | Admitting: Family Medicine

## 2022-02-24 NOTE — Progress Notes (Deleted)
Name: Rachel Dunlap   MRN: [id number]    DOB: [birth year]   Date:02/24/2022       Progress Note  Subjective  Chief Complaint  Follow Up  HPI  Obesity: She has a long history of obesity ( since childhood) max weight was 265 lbs . started with live life style modification and in 11/2019 her weight was 248 lbs started on Ozempic 02/2020 for insulin resistance and obesity, her weight was 236 lbs at the time with a BMI of 37. 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    HTN: today bp is up but she did not take bp medications before she left the house  she sees  Dr. Ronn Melena /nephrologist once a year. She is taking vasotec because of microalbuminuria and also Atenolol.  She is aware of teratogenic effects of ACE    Pre-diabetes: she denies polyphagia, polydipsia or polyuria. Fasting insulin was elevated, she has been physically active and eating healthy, last A1C was norma at 5.2 % l, she is still losing weight and taking Ozempic as prescribed    AR: doing well at this time, taking medications, no rhinorrhea or congestion. Taking singulair and loratadine. She states symptoms flared over the weekend by having increase in rhinorrhea and sneezing due to spending the day outdoors.   Asthma: she is taking singulair daily, denies wheezing, cough or SOB. Doing well today. Discussed PCV 20   Genital herpes: she takes valtrex daily, she states last outbreak was years ago when she was first diagnosed.  Unchanged    Constipation: long history of constipation,  doing well lately with change in diet,  eating more fiber, only taking Miralax prn , she noticed that when she stopped Miralax frequency went down to twice a week.   Eczema: uses Topicort prn currently no flares   ASCUS : in 09/2019 , seen by gyn , had a colposcopy and last pap smear done 11/2020 was back to normal   Patient Active Problem List   Diagnosis Date Noted   B12 deficiency 08/13/2021   Obesity (BMI 30.0-34.9) 08/13/2021   Major depression in remission (HCC) 08/13/2021   Perennial allergic rhinitis with seasonal variation 08/13/2021   Eczema 08/31/2018   Insulin resistance 05/16/2018   Prediabetes 02/14/2018   Essential hypertension 02/14/2018   Back pain due to injury 09/18/2016   Acne vulgaris 09/16/2015   Hypertrichosis 09/16/2015   Chronic constipation 11/09/2014   Chronic dermatitis 11/09/2014   Metabolic syndrome 11/09/2014   Hypertension, benign 11/09/2014   Genital herpes in women 11/09/2014   Mild intermittent asthma, uncomplicated 11/09/2014   Allergic rhinitis, seasonal 11/09/2014   IBS (irritable bowel syndrome) 11/09/2014   Bilateral polycystic ovarian syndrome 11/09/2014   Abnormal presence of protein in urine 11/09/2014   Vitamin D deficiency 11/09/2014   Anxiety 11/09/2014    No past surgical history on file.  Family History  Problem Relation Age of Onset   Hypertension Mother    Obesity Mother    Cancer Father  Diabetes Father    Kidney disease Father    Obesity Maternal Grandmother     Social History   Tobacco Use   Smoking status: Never   Smokeless tobacco: Never  Substance Use Topics   Alcohol use: Yes    Alcohol/week: 0.0 standard drinks of alcohol    Comment: seldom     Current Outpatient Medications:    albuterol (VENTOLIN HFA) 108 (90 Base) MCG/ACT inhaler, Inhale 2 puffs into the lungs every 6 (six) hours as needed for wheezing or shortness of breath., Disp: 6.7 each, Rfl: 0   atenolol (TENORMIN) 25 MG tablet, TAKE 1 TABLET (25 MG TOTAL) BY MOUTH DAILY., Disp:  90 tablet, Rfl: 0   Blood Pressure Monitoring (OMRON 5 SERIES BP MONITOR) DEVI, , Disp: , Rfl:    desogestrel-ethinyl estradiol (APRI) 0.15-30 MG-MCG tablet, Take 1 tablet by mouth daily., Disp: 84 tablet, Rfl: 3   desoximetasone (TOPICORT) 0.25 % cream, Apply 1 application. topically 2 (two) times daily., Disp: 100 g, Rfl: 0   enalapril (VASOTEC) 5 MG tablet, Take 1 tablet by mouth daily., Disp: , Rfl:    fluticasone (FLONASE) 50 MCG/ACT nasal spray, Place 2 sprays into both nostrils at bedtime., Disp: 48 g, Rfl: 1   ketoconazole (NIZORAL) 2 % cream, APPLY 1 APPLICATION TOPICALLY AT BEDTIME., Disp: 60 g, Rfl: 0   loratadine (CLARITIN) 10 MG tablet, Take 1 tablet (10 mg total) by mouth daily., Disp: 90 tablet, Rfl: 1   montelukast (SINGULAIR) 10 MG tablet, TAKE 1 TABLET BY MOUTH EVERYDAY AT BEDTIME, Disp: 90 tablet, Rfl: 1   polyethylene glycol powder (GLYCOLAX/MIRALAX) 17 GM/SCOOP powder, Take 17 g by mouth 2 (two) times daily as needed., Disp: 3350 g, Rfl: 1   Semaglutide,0.25 or 0.5MG /DOS, (OZEMPIC, 0.25 OR 0.5 MG/DOSE,) 2 MG/1.5ML SOPN, Inject 0.5 mg into the skin once a week., Disp: 4.5 mL, Rfl: 1   triamcinolone cream (KENALOG) 0.1 %, Apply 1 application topically 2 (two) times daily., Disp: 45 g, Rfl: 0   valACYclovir (VALTREX) 500 MG tablet, TAKE 1 TABLET (500 MG TOTAL) BY MOUTH DAILY. AND TWICE DAILY ON OUTBREAKS, Disp: 115 tablet, Rfl: 1  No Known Allergies  I personally reviewed active problem list, medication list, allergies, family history, social history, health maintenance with the patient/caregiver today.   ROS  ***  Objective  There were no vitals filed for this visit.  There is no height or weight on file to calculate BMI.  Physical Exam ***  No results found for this or any previous visit (from the past 2160 hour(s)).   PHQ2/9:    08/13/2021    9:23 AM 04/23/2021    8:07 AM 11/20/2020   10:30 AM 09/03/2020   10:49 AM 05/22/2020    2:38 PM  Depression screen  PHQ 2/9  Decreased Interest 0 0 0 0 0  Down, Depressed, Hopeless 0 0 0 0 0  PHQ - 2 Score 0 0 0 0 0  Altered sleeping 0 0 0 0   Tired, decreased energy 0 0 0 0   Change in appetite 0 0 0 0   Feeling bad or failure about yourself  0 0 0 0   Trouble concentrating 0 0 0 0   Moving slowly or fidgety/restless 0 0 0 0   Suicidal thoughts 0 0 0 0   PHQ-9 Score 0 0 0 0     phq 9 is {gen pos [id number]}   Fall Risk:    08/13/2021  9:22 AM 04/23/2021    8:07 AM 11/20/2020   10:29 AM 09/03/2020   10:49 AM 05/22/2020    2:38 PM  Fall Risk   Falls in the past year? 0 0 0 0 0  Number falls in past yr: 0 0 0 0 0  Injury with Fall? 0 0 0 0 0  Risk for fall due to : No Fall Risks No Fall Risks No Fall Risks    Follow up Falls prevention discussed Falls prevention discussed Falls prevention discussed        Functional Status Survey:      Assessment & Plan  *** There are no diagnoses linked to this encounter.

## 2022-02-25 ENCOUNTER — Ambulatory Visit: Payer: Self-pay | Admitting: Family Medicine

## 2022-02-25 ENCOUNTER — Encounter: Payer: Self-pay | Admitting: Family Medicine

## 2022-02-25 ENCOUNTER — Other Ambulatory Visit: Payer: Self-pay

## 2022-02-25 ENCOUNTER — Other Ambulatory Visit: Payer: Self-pay | Admitting: Family Medicine

## 2022-03-03 NOTE — Progress Notes (Unsigned)
Name: Rachel Dunlap   MRN: [id number]    DOB: 05-05-1995   Date:03/04/2022       Progress Note  Subjective  Chief Complaint  Follow Up/ Depression Medication  HPI  Obesity: She has a long history of obesity ( since childhood) max weight was 265 lbs . started with l life style modification and in 11/2019 her weight was 248 lbs started on Ozempic 02/2020 for insulin resistance and obesity, her weight was 236 lbs at the time with a BMI of 37. Weight went down to 221 lbs but she has been out of Ozempic since Summer 2023 and weight is up to 248 lbs. She used to take for pre-diabetes but insurance declined. She states she changed insurance and will see if she can take [facility] - but since there is a Therapist, art - we will try contrave - discussed possible side effects and must monitor bp    Depression Major: she is back in therapy with [facility] since Summer 2021 , she stopped taking Effexor on her own Spring 2023 because she was feeling well, her father died in 2016/07/24 her half sister from father's side had a baby in October and she gets emotional when she sees her dad's side of the family, she feels like she is grieving again. She decided to resume Effexor on her own but needs a refill today. She noticed improvement of symptoms when she resumed medication    HTN: She sees  Dr. Ronn Melena /nephrologist once a year. She is taking vasotec because of microalbuminuria and also Atenolol.  She is aware of teratogenic effects of ACE . BO is well controlled today    Pre-diabetes: she denies polyphagia, polydipsia or polyuria. Fasting insulin was elevated, she has been physically active and eating healthy, last A1C was norma at 5.2 % , since last visit she has gained weight, off Ozempic    AR: doing well at this time, taking medications, no rhinorrhea or congestion. Taking singulair and loratadine.   Asthma: she is taking singulair daily, denies wheezing, cough or SOB. Doing well today.   Genital  herpes: she takes valtrex daily, she states last outbreak was years ago when she was first diagnosed.  Unchanged   Constipation: long history of constipation,  doing well lately with change in diet, eating more fiber, only taking Miralax prn.    Eczema: uses Topicort prn , she uses medication for flares intermittently    Patient Active Problem List   Diagnosis Date Noted   B12 deficiency 08/13/2021   Obesity (BMI 30.0-34.9) 08/13/2021   Major depression in remission (HCC) 08/13/2021   Perennial allergic rhinitis with seasonal variation 08/13/2021   Eczema 08/31/2018   Insulin resistance 05/16/2018   Prediabetes 02/14/2018   Essential hypertension 02/14/2018   Back pain due to injury 09/18/2016   Acne vulgaris 09/16/2015   Hypertrichosis 09/16/2015   Chronic constipation 11/09/2014   Chronic dermatitis 11/09/2014   Metabolic syndrome 11/09/2014   Hypertension, benign 11/09/2014   Genital herpes in women 11/09/2014   Mild intermittent asthma, uncomplicated 11/09/2014   Allergic rhinitis, seasonal 11/09/2014   IBS (irritable bowel syndrome) 11/09/2014   Bilateral polycystic ovarian syndrome 11/09/2014   Abnormal presence of protein in urine 11/09/2014   Vitamin D deficiency 11/09/2014   Anxiety 11/09/2014    History reviewed. No pertinent surgical history.  Family History  Problem Relation Age of Onset   Hypertension Mother    Obesity Mother    Cancer Father  Diabetes Father    Kidney disease Father    Obesity Maternal Grandmother     Social History   Tobacco Use   Smoking status: Never   Smokeless tobacco: Never  Substance Use Topics   Alcohol use: Yes    Alcohol/week: 0.0 standard drinks of alcohol    Comment: seldom     Current Outpatient Medications:    albuterol (VENTOLIN HFA) 108 (90 Base) MCG/ACT inhaler, Inhale 2 puffs into the lungs every 6 (six) hours as needed for wheezing or shortness of breath., Disp: 6.7 each, Rfl: 0   Blood Pressure Monitoring  (OMRON 5 SERIES BP MONITOR) DEVI, , Disp: , Rfl:    desogestrel-ethinyl estradiol (APRI) 0.15-30 MG-MCG tablet, Take 1 tablet by mouth daily., Disp: 84 tablet, Rfl: 3   desoximetasone (TOPICORT) 0.25 % cream, Apply 1 application. topically 2 (two) times daily., Disp: 100 g, Rfl: 0   enalapril (VASOTEC) 5 MG tablet, Take 1 tablet by mouth daily., Disp: , Rfl:    fluticasone (FLONASE) 50 MCG/ACT nasal spray, Place 2 sprays into both nostrils at bedtime., Disp: 48 g, Rfl: 1   ketoconazole (NIZORAL) 2 % cream, APPLY 1 APPLICATION TOPICALLY AT BEDTIME., Disp: 60 g, Rfl: 0   loratadine (CLARITIN) 10 MG tablet, Take 1 tablet (10 mg total) by mouth daily., Disp: 90 tablet, Rfl: 1   Naltrexone-buPROPion HCl ER (CONTRAVE) 8-90 MG TB12, Take 2 tablets by mouth in the morning and at bedtime., Disp: 120 tablet, Rfl: 2   polyethylene glycol powder (GLYCOLAX/MIRALAX) 17 GM/SCOOP powder, Take 17 g by mouth 2 (two) times daily as needed., Disp: 3350 g, Rfl: 1   triamcinolone cream (KENALOG) 0.1 %, Apply 1 application topically 2 (two) times daily., Disp: 45 g, Rfl: 0   valACYclovir (VALTREX) 500 MG tablet, TAKE 1 TABLET (500 MG TOTAL) BY MOUTH DAILY. AND TWICE DAILY ON OUTBREAKS, Disp: 115 tablet, Rfl: 1   atenolol (TENORMIN) 25 MG tablet, Take 1 tablet (25 mg total) by mouth daily., Disp: 90 tablet, Rfl: 1   montelukast (SINGULAIR) 10 MG tablet, TAKE 1 TABLET BY MOUTH EVERYDAY AT BEDTIME, Disp: 90 tablet, Rfl: 1  No Known Allergies  I personally reviewed active problem list, medication list, allergies, family history, social history, health maintenance with the patient/caregiver today.   ROS  Constitutional: Negative for fever , positive for weight change.  Respiratory: Negative for cough and shortness of breath.   Cardiovascular: Negative for chest pain or palpitations.  Gastrointestinal: Negative for abdominal pain, no bowel changes.  Musculoskeletal: Negative for gait problem or joint swelling.  Skin:  Negative for rash.  Neurological: Negative for dizziness or headache.  No other specific complaints in a complete review of systems (except as listed in HPI above).   Objective  Vitals:   03/04/22 1341  BP: 126/70  Pulse: 99  Resp: 16  Temp: 98.9 F (37.2 C)  TempSrc: Oral  SpO2: 98%  Weight: 248 lb 6.4 oz (112.7 kg)  Height: 5' 7.5" (1.715 m)    Body mass index is 38.33 kg/m.  Physical Exam  Constitutional: Patient appears well-developed and well-nourished. Obese  No distress.  HEENT: head atraumatic, normocephalic, pupils equal and reactive to light, neck supple, throat within normal limits Cardiovascular: Normal rate, regular rhythm and normal heart sounds.  No murmur heard. No BLE edema. Pulmonary/Chest: Effort normal and breath sounds normal. No respiratory distress. Abdominal: Soft.  There is no tenderness. Psychiatric: Patient has a normal mood and affect. behavior is normal. Judgment  and thought content normal.   PHQ2/9:    03/04/2022    1:47 PM 08/13/2021    9:23 AM 04/23/2021    8:07 AM 11/20/2020   10:30 AM 09/03/2020   10:49 AM  Depression screen PHQ 2/9  Decreased Interest 0 0 0 0 0  Down, Depressed, Hopeless 0 0 0 0 0  PHQ - 2 Score 0 0 0 0 0  Altered sleeping 0 0 0 0 0  Tired, decreased energy 1 0 0 0 0  Change in appetite 1 0 0 0 0  Feeling bad or failure about yourself  0 0 0 0 0  Trouble concentrating 0 0 0 0 0  Moving slowly or fidgety/restless 0 0 0 0 0  Suicidal thoughts 0 0 0 0 0  PHQ-9 Score 2 0 0 0 0  Difficult doing work/chores Not difficult at all        phq 9 is negative   Fall Risk:    03/04/2022    1:43 PM 08/13/2021    9:22 AM 04/23/2021    8:07 AM 11/20/2020   10:29 AM 09/03/2020   10:49 AM  Fall Risk   Falls in the past year? 0 0 0 0 0  Number falls in past yr:  0 0 0 0  Injury with Fall?  0 0 0 0  Risk for fall due to : No Fall Risks No Fall Risks No Fall Risks No Fall Risks   Follow up Falls prevention discussed;Education  provided;Falls evaluation completed Falls prevention discussed Falls prevention discussed Falls prevention discussed      Assessment & Plan  1. Need for immunization against influenza  - Flu Vaccine QUAD 6+ mos PF IM (Fluarix Quad PF)  2. Hypertension, benign  - atenolol (TENORMIN) 25 MG tablet; Take 1 tablet (25 mg total) by mouth daily.  Dispense: 90 tablet; Refill: 1  3. Mild intermittent asthma, uncomplicated  - montelukast (SINGULAIR) 10 MG tablet; TAKE 1 TABLET BY MOUTH EVERYDAY AT BEDTIME  Dispense: 90 tablet; Refill: 1  4. Prediabetes   5. B12 deficiency  Continue supplementation   6. Vitamin D deficiency  Continue supplementation   7. Metabolic syndrome  Off Ozempic, no longer covered by insurance  8. Chronic constipation  Doing better   9. Other eczema   10. Morbid obesity (HCC)  - Naltrexone-buPROPion HCl ER (CONTRAVE) 8-90 MG TB12; Take 2 tablets by mouth in the morning and at bedtime.  Dispense: 120 tablet; Refill: 2

## 2022-03-04 ENCOUNTER — Encounter: Payer: Self-pay | Admitting: Family Medicine

## 2022-03-04 ENCOUNTER — Ambulatory Visit: Payer: 59 | Admitting: Family Medicine

## 2022-03-04 VITALS — BP 126/70 | HR 99 | Temp 98.9°F | Resp 16 | Ht 67.5 in | Wt 248.4 lb

## 2022-03-04 DIAGNOSIS — I10 Essential (primary) hypertension: Secondary | ICD-10-CM | POA: Diagnosis not present

## 2022-03-04 DIAGNOSIS — Z23 Encounter for immunization: Secondary | ICD-10-CM

## 2022-03-04 DIAGNOSIS — R7303 Prediabetes: Secondary | ICD-10-CM

## 2022-03-04 DIAGNOSIS — K5909 Other constipation: Secondary | ICD-10-CM

## 2022-03-04 DIAGNOSIS — E559 Vitamin D deficiency, unspecified: Secondary | ICD-10-CM

## 2022-03-04 DIAGNOSIS — E538 Deficiency of other specified B group vitamins: Secondary | ICD-10-CM | POA: Diagnosis not present

## 2022-03-04 DIAGNOSIS — J4520 Mild intermittent asthma, uncomplicated: Secondary | ICD-10-CM | POA: Diagnosis not present

## 2022-03-04 DIAGNOSIS — L308 Other specified dermatitis: Secondary | ICD-10-CM

## 2022-03-04 DIAGNOSIS — E8881 Metabolic syndrome: Secondary | ICD-10-CM

## 2022-03-04 MED ORDER — MONTELUKAST SODIUM 10 MG PO TABS: ORAL_TABLET | ORAL | 1 refills | Status: DC

## 2022-03-04 MED ORDER — CONTRAVE 8-90 MG PO TB12: 2.0000 | ORAL_TABLET | Freq: Two times a day (BID) | ORAL | 2 refills | Status: DC

## 2022-03-04 MED ORDER — TRIAMCINOLONE ACETONIDE 0.1 % EX CREA: 1.0000 | TOPICAL_CREAM | Freq: Two times a day (BID) | CUTANEOUS | 0 refills | Status: DC

## 2022-03-04 MED ORDER — ATENOLOL 25 MG PO TABS: 25.0000 mg | ORAL_TABLET | Freq: Every day | ORAL | 1 refills | Status: DC

## 2022-03-04 NOTE — Patient Instructions (Signed)
Start 1 tablet every morning for 7 days, then 1 tablet twice daily for 7 days, then 2 tablets every morning and one every evening 

## 2022-03-10 ENCOUNTER — Other Ambulatory Visit: Payer: Self-pay | Admitting: Family Medicine

## 2022-03-10 ENCOUNTER — Encounter: Payer: Self-pay | Admitting: Family Medicine

## 2022-03-10 MED ORDER — VENLAFAXINE HCL ER 75 MG PO CP24: 75.0000 mg | ORAL_CAPSULE | Freq: Every day | ORAL | 0 refills | Status: DC

## 2022-04-03 ENCOUNTER — Ambulatory Visit: Payer: 59

## 2022-04-03 VITALS — BP 162/78

## 2022-04-03 DIAGNOSIS — Z0130 Encounter for examination of blood pressure without abnormal findings: Secondary | ICD-10-CM

## 2022-04-03 NOTE — Progress Notes (Signed)
Patient reported had not taken bp meds today. She stated she will return for a nurse visit on Tuesday after taking her medicines for another re-check.

## 2022-04-28 NOTE — Progress Notes (Deleted)
Name: Rachel Dunlap   MRN: [id number]    DOB: 1996-01-13   Date:04/28/2022       Progress Note  Subjective  Chief Complaint  Annual Exam  HPI  Patient presents for annual CPE.  Diet: *** Exercise: ***  Last Eye Exam: *** Last Dental Exam: ***  Flowsheet Row Office Visit from 03/04/2022 in [facility]  AUDIT-C Score 1      Depression: Phq 9 is  {Desc; negative/positive:13464}    03/04/2022    1:47 PM 08/13/2021    9:23 AM 04/23/2021    8:07 AM 11/20/2020   10:30 AM 09/03/2020   10:49 AM  Depression screen PHQ 2/9  Decreased Interest 0 0 0 0 0  Down, Depressed, Hopeless 0 0 0 0 0  PHQ - 2 Score 0 0 0 0 0  Altered sleeping 0 0 0 0 0  Tired, decreased energy 1 0 0 0 0  Change in appetite 1 0 0 0 0  Feeling bad or failure about yourself  0 0 0 0 0  Trouble concentrating 0 0 0 0 0  Moving slowly or fidgety/restless 0 0 0 0 0  Suicidal thoughts 0 0 0 0 0  PHQ-9 Score 2 0 0 0 0  Difficult doing work/chores Not difficult at all       Hypertension: BP Readings from Last 3 Encounters:  04/03/22 (!) 162/78  03/04/22 126/70  08/13/21 (!) 142/80   Obesity: Wt Readings from Last 3 Encounters:  03/04/22 248 lb 6.4 oz (112.7 kg)  08/13/21 221 lb (100.2 kg)  04/23/21 230 lb (104.3 kg)   BMI Readings from Last 3 Encounters:  03/04/22 38.33 kg/m  08/13/21 34.61 kg/m  04/23/21 34.97 kg/m     Vaccines:   HPV: up to date Tdap: up to date Shingrix: N/A Pneumonia: N/A Flu: up to date COVID-5: up to date   Hep C Screening: 09/22/18 STD testing and prevention (HIV/chl/gon/syphilis): 09/25/19 Intimate partner violence: negative screen  Sexual History : Menstrual History/LMP/Abnormal Bleeding:  Discussed importance of follow up if any post-menopausal bleeding: not applicable  Incontinence Symptoms: negative for symptoms   Breast cancer:  - Last Mammogram: N/A - BRCA gene screening: N/A   Osteoporosis Prevention : Discussed high calcium  and vitamin D supplementation, weight bearing exercises Bone density: N/A   Cervical cancer screening: 12/04/20  Skin cancer: Discussed monitoring for atypical lesions  Colorectal cancer: N/A   Lung cancer:  Low Dose CT Chest recommended if Age 54-80 years, 20 pack-year currently smoking OR have quit w/in 15years. Patient does not qualify for screen   ECG: 01/06/18  Advanced Care Planning: A voluntary discussion about advance care planning including the explanation and discussion of advance directives.  Discussed health care proxy and Living will, and the patient was able to identify a health care proxy as ***.  Patient does not have a living will and power of attorney of health care   Lipids: Lab Results  Component Value Date   CHOL 206 (H) 04/23/2021   CHOL 169 09/25/2019   CHOL 162 09/22/2018   Lab Results  Component Value Date   HDL 72 04/23/2021   HDL 71 09/25/2019   HDL 57 09/22/2018   Lab Results  Component Value Date   LDLCALC 116 (H) 04/23/2021   LDLCALC 84 09/25/2019   LDLCALC 84 09/22/2018   Lab Results  Component Value Date   TRIG 79 04/23/2021   TRIG 60 09/25/2019   TRIG  109 09/22/2018   Lab Results  Component Value Date   CHOLHDL 2.9 04/23/2021   CHOLHDL 2.4 09/25/2019   CHOLHDL 2.8 09/22/2018   No results found for: "LDLDIRECT"  Glucose: Glucose, Bld  Date Value Ref Range Status  04/23/2021 82 65 - 99 mg/dL Final    Comment:    .            Fasting reference interval .   09/25/2019 91 65 - 99 mg/dL Final    Comment:    .            Fasting reference interval .   09/22/2018 78 65 - 99 mg/dL Final    Comment:    .            Fasting reference interval .     Patient Active Problem List   Diagnosis Date Noted   B12 deficiency 08/13/2021   Obesity (BMI 30.0-34.9) 08/13/2021   Major depression in remission ([place]) 08/13/2021   Perennial allergic rhinitis with seasonal variation 08/13/2021   Eczema 08/31/2018   Insulin resistance  05/16/2018   Prediabetes 02/14/2018   Essential hypertension 02/14/2018   Back pain due to injury 09/18/2016   Acne vulgaris 09/16/2015   Hypertrichosis 09/16/2015   Chronic constipation 11/09/2014   Chronic dermatitis 123XX123   Metabolic syndrome 123XX123   Hypertension, benign 11/09/2014   Genital herpes in women 11/09/2014   Mild intermittent asthma, uncomplicated 123XX123   Allergic rhinitis, seasonal 11/09/2014   IBS (irritable bowel syndrome) 11/09/2014   Bilateral polycystic ovarian syndrome 11/09/2014   Abnormal presence of protein in urine 11/09/2014   Vitamin D deficiency 11/09/2014   Anxiety 11/09/2014    No past surgical history on file.  Family History  Problem Relation Age of Onset   Hypertension Mother    Obesity Mother    Cancer Father    Diabetes Father    Kidney disease Father    Obesity Maternal Grandmother     Social History   Socioeconomic History   Marital status: Single    Spouse name: Not on file   Number of children: 0   Years of education: Not on file   Highest education level: Some college, no degree  Occupational History   Occupation: gas station attendant.     Comment: [employer]  Tobacco Use   Smoking status: Never   Smokeless tobacco: Never  Vaping Use   Vaping Use: Never used  Substance and Sexual Activity   Alcohol use: Yes    Alcohol/week: 0.0 standard drinks of alcohol    Comment: seldom   Drug use: No   Sexual activity: Not Currently    Partners: Male    Birth control/protection: Pill, Abstinence  Other Topics Concern   Not on file  Social History Narrative   She wemt to school at [employer] - graduated Dec  2019 - hospitality and tourism management - she wants to be chef-goal is to work at a [employer].   She works part time at [employer]: [employer]  (04/23/2021)   Overall Financial Resource Strain (CARDIA)    Difficulty of Paying Living Expenses:  Not hard at all  Food Insecurity: No Food Insecurity (04/23/2021)   Hunger Vital Sign    Worried About Running Out of Food in the Last Year: Never true    Ran Out of Food in the Last Year: Never true  Transportation Needs: No  Transportation Needs (04/23/2021)   PRAPARE - Hydrologist (Medical): No    Lack of Transportation (Non-Medical): No  Physical Activity: Insufficiently Active (04/23/2021)   Exercise Vital Sign    Days of Exercise per Week: 3 days    Minutes of Exercise per Session: 40 min  Stress: No Stress Concern Present (04/23/2021)   [place]    Feeling of Stress : Not at all  Social Connections: Socially Isolated (04/23/2021)   Social Connection and Isolation Panel [NHANES]    Frequency of Communication with Friends and Family: Twice a week    Frequency of Social Gatherings with Friends and Family: Three times a week    Attends Religious Services: Never    Active Member of Clubs or Organizations: No    Attends Archivist Meetings: Never    Marital Status: Never married  Intimate Partner Violence: Not At Risk (04/23/2021)   Humiliation, Afraid, Rape, and Kick questionnaire    Fear of Current or Ex-Partner: No    Emotionally Abused: No    Physically Abused: No    Sexually Abused: No     Current Outpatient Medications:    venlafaxine XR (EFFEXOR XR) 75 MG 24 hr capsule, Take 1 capsule (75 mg total) by mouth daily with breakfast., Disp: 90 capsule, Rfl: 0   albuterol (VENTOLIN HFA) 108 (90 Base) MCG/ACT inhaler, Inhale 2 puffs into the lungs every 6 (six) hours as needed for wheezing or shortness of breath., Disp: 6.7 each, Rfl: 0   atenolol (TENORMIN) 25 MG tablet, Take 1 tablet (25 mg total) by mouth daily., Disp: 90 tablet, Rfl: 1   Blood Pressure Monitoring (OMRON 5 SERIES BP MONITOR) DEVI, , Disp: , Rfl:    desogestrel-ethinyl estradiol (APRI) 0.15-30 MG-MCG tablet,  Take 1 tablet by mouth daily., Disp: 84 tablet, Rfl: 3   desoximetasone (TOPICORT) 0.25 % cream, Apply 1 application. topically 2 (two) times daily., Disp: 100 g, Rfl: 0   enalapril (VASOTEC) 5 MG tablet, Take 1 tablet by mouth daily., Disp: , Rfl:    fluticasone (FLONASE) 50 MCG/ACT nasal spray, Place 2 sprays into both nostrils at bedtime., Disp: 48 g, Rfl: 1   ketoconazole (NIZORAL) 2 % cream, APPLY 1 APPLICATION TOPICALLY AT BEDTIME., Disp: 60 g, Rfl: 0   loratadine (CLARITIN) 10 MG tablet, Take 1 tablet (10 mg total) by mouth daily., Disp: 90 tablet, Rfl: 1   montelukast (SINGULAIR) 10 MG tablet, TAKE 1 TABLET BY MOUTH EVERYDAY AT BEDTIME, Disp: 90 tablet, Rfl: 1   Naltrexone-buPROPion HCl ER (CONTRAVE) 8-90 MG TB12, Take 2 tablets by mouth in the morning and at bedtime., Disp: 120 tablet, Rfl: 2   polyethylene glycol powder (GLYCOLAX/MIRALAX) 17 GM/SCOOP powder, Take 17 g by mouth 2 (two) times daily as needed., Disp: 3350 g, Rfl: 1   triamcinolone cream (KENALOG) 0.1 %, Apply 1 Application topically 2 (two) times daily., Disp: 453.6 g, Rfl: 0   valACYclovir (VALTREX) 500 MG tablet, TAKE 1 TABLET (500 MG TOTAL) BY MOUTH DAILY. AND TWICE DAILY ON OUTBREAKS, Disp: 115 tablet, Rfl: 1  No Known Allergies   ROS  ***  Objective  There were no vitals filed for this visit.  There is no height or weight on file to calculate BMI.  Physical Exam ***  No results found for this or any previous visit (from the past 2160 hour(s)).   Fall Risk:    03/04/2022  1:43 PM 08/13/2021    9:22 AM 04/23/2021    8:07 AM 11/20/2020   10:29 AM 09/03/2020   10:49 AM  Fall Risk   Falls in the past year? 0 0 0 0 0  Number falls in past yr:  0 0 0 0  Injury with Fall?  0 0 0 0  Risk for fall due to : No Fall Risks No Fall Risks No Fall Risks No Fall Risks   Follow up Falls prevention discussed;Education provided;Falls evaluation completed Falls prevention discussed Falls prevention discussed Falls  prevention discussed      Functional Status Survey:     Assessment & Plan  1. Well adult exam ***   -USPSTF grade A and B recommendations reviewed with patient; age-appropriate recommendations, preventive care, screening tests, etc discussed and encouraged; healthy living encouraged; see AVS for patient education given to patient -Discussed importance of 150 minutes of physical activity weekly, eat two servings of fish weekly, eat one serving of tree nuts ( cashews, pistachios, pecans, almonds.Marland Kitchen) every other day, eat 6 servings of fruit/vegetables daily and drink plenty of water and avoid sweet beverages.   -Reviewed Health Maintenance: Yes.

## 2022-04-28 NOTE — Patient Instructions (Incomplete)

## 2022-04-29 ENCOUNTER — Encounter: Payer: 59 | Admitting: Family Medicine

## 2022-05-23 ENCOUNTER — Other Ambulatory Visit: Payer: Self-pay | Admitting: Family Medicine

## 2022-05-25 MED ORDER — VENLAFAXINE HCL ER 75 MG PO CP24: 75.0000 mg | ORAL_CAPSULE | Freq: Every day | ORAL | 0 refills | Status: DC

## 2022-05-26 NOTE — Progress Notes (Unsigned)
Name: Rachel Dunlap   MRN: [id number]    DOB: Jun 01, 1995   Date:05/27/2022       Progress Note  Subjective  Chief Complaint  Follow Up  HPI  Obesity: She has a long history of obesity ( since childhood) max weight was 265 lbs . started with l life style modification and in 11/2019 her weight was 248 lbs started on Ozempic 02/2020 for insulin resistance and obesity, her weight was 236 lbs at the time with a BMI of 37. Weight went down to 221 lbs but she has been out of Ozempic since Summer 2023 and weight is up to 248 lbs. She used to take for pre-diabetes but insurance declined. She states she changed insurance and will see if she can take [facility] - but since there is a Consulting civil engineer - we sent Contrave but not covered by insurance. She has Medicaid now, she is going to the gym, cooking most meals at home and lost 5 lbs on her own since last visit. Continue life style modifications, explained weight loss medications not covered by insurance    Depression Major: she is back in therapy with Leonor Liv since Summer 2021 , she stopped taking Effexor on her own Spring 2023 because she was feeling well, her father died in Jun 22, 2016 her half sister from father's side had a baby in October and she gets emotional when she sees her dad's side of the family, she feels like she is grieving again. She decided to resume Effexor in Jun 22, 2021. 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   HTN: She sees  Dr. Abigail Butts /nephrologist once a year. She is taking vasotec because of microalbuminuria and also Atenolol.  She is aware of teratogenic effects of ACE . BP is at goal    Pre-diabetes: she denies polyphagia, polydipsia or polyuria. Fasting insulin was elevated, she has been physically active and eating healthy, last A1C was norma at 5.2 %  she lost 5 lbs since last visit    AR: doing well at  this time, taking medications, no rhinorrhea or congestion. Taking singulair and loratadine. She needs refills.   Asthma: she is taking singulair daily, denies wheezing, cough but ha noticed mild SOB with activity but likely due to deconditioning, recently went back to the gym   Genital herpes: she takes valtrex daily, she states last outbreak was years ago when she was first diagnosed.  Never had a recurrence but takes medication for prophylaxis    Constipation: long history of constipation,  she is on prn miralax and drinking more water    Eczema: uses Topicort prn , she uses medication for flares intermittently   Patient Active Problem List   Diagnosis Date Noted   B12 deficiency 08/13/2021   Obesity (BMI 30.0-34.9) 08/13/2021   Major depression in remission ([place]) 08/13/2021   Perennial allergic rhinitis with seasonal variation 08/13/2021   Eczema 08/31/2018   Insulin resistance 05/16/2018   Prediabetes 02/14/2018   Essential hypertension 02/14/2018   Back pain due to injury 09/18/2016   Acne vulgaris 09/16/2015   Hypertrichosis 09/16/2015   Chronic constipation 11/09/2014   Chronic dermatitis 123XX123   Metabolic syndrome 123XX123   Hypertension, benign 11/09/2014   Genital herpes in women 11/09/2014   Mild intermittent asthma, uncomplicated 123XX123   Allergic rhinitis, seasonal 11/09/2014   IBS (irritable bowel syndrome) 11/09/2014  Bilateral polycystic ovarian syndrome 11/09/2014   Abnormal presence of protein in urine 11/09/2014   Vitamin D deficiency 11/09/2014   Anxiety 11/09/2014    No past surgical history on file.  Family History  Problem Relation Age of Onset   Hypertension Mother    Obesity Mother    Cancer Father    Diabetes Father    Kidney disease Father    Obesity Maternal Grandmother     Social History   Tobacco Use   Smoking status: Never   Smokeless tobacco: Never  Substance Use Topics   Alcohol use: Yes    Alcohol/week: 0.0  standard drinks of alcohol    Comment: seldom     Current Outpatient Medications:    albuterol (VENTOLIN HFA) 108 (90 Base) MCG/ACT inhaler, Inhale 2 puffs into the lungs every 6 (six) hours as needed for wheezing or shortness of breath., Disp: 6.7 each, Rfl: 0   atenolol (TENORMIN) 25 MG tablet, Take 1 tablet (25 mg total) by mouth daily., Disp: 90 tablet, Rfl: 1   Blood Pressure Monitoring (OMRON 5 SERIES BP MONITOR) DEVI, , Disp: , Rfl:    desogestrel-ethinyl estradiol (APRI) 0.15-30 MG-MCG tablet, Take 1 tablet by mouth daily., Disp: 84 tablet, Rfl: 3   desoximetasone (TOPICORT) 0.25 % cream, Apply 1 application. topically 2 (two) times daily., Disp: 100 g, Rfl: 0   enalapril (VASOTEC) 5 MG tablet, Take 1 tablet by mouth daily., Disp: , Rfl:    fluticasone (FLONASE) 50 MCG/ACT nasal spray, Place 2 sprays into both nostrils at bedtime., Disp: 48 g, Rfl: 1   ketoconazole (NIZORAL) 2 % cream, APPLY 1 APPLICATION TOPICALLY AT BEDTIME., Disp: 60 g, Rfl: 0   loratadine (CLARITIN) 10 MG tablet, Take 1 tablet (10 mg total) by mouth daily., Disp: 90 tablet, Rfl: 1   montelukast (SINGULAIR) 10 MG tablet, TAKE 1 TABLET BY MOUTH EVERYDAY AT BEDTIME, Disp: 90 tablet, Rfl: 1   polyethylene glycol powder (GLYCOLAX/MIRALAX) 17 GM/SCOOP powder, Take 17 g by mouth 2 (two) times daily as needed., Disp: 3350 g, Rfl: 1   triamcinolone cream (KENALOG) 0.1 %, Apply 1 Application topically 2 (two) times daily., Disp: 453.6 g, Rfl: 0   valACYclovir (VALTREX) 500 MG tablet, TAKE 1 TABLET (500 MG TOTAL) BY MOUTH DAILY. AND TWICE DAILY ON OUTBREAKS, Disp: 115 tablet, Rfl: 1   venlafaxine XR (EFFEXOR XR) 75 MG 24 hr capsule, Take 1 capsule (75 mg total) by mouth daily with breakfast., Disp: 30 capsule, Rfl: 0  No Known Allergies  I personally reviewed active problem list, medication list, allergies, family history, social history, health maintenance with the patient/caregiver today.   ROS  Constitutional:  Negative for fever, positive for weight change.  Respiratory: Negative for cough and shortness of breath.   Cardiovascular: Negative for chest pain or palpitations.  Gastrointestinal: Negative for abdominal pain, no bowel changes.  Musculoskeletal: Negative for gait problem or joint swelling.  Skin: Negative for rash.  Neurological: Negative for dizziness or headache.  No other specific complaints in a complete review of systems (except as listed in HPI above).   Objective  Vitals:   05/27/22 1519  BP: 122/82  Pulse: 91  Resp: 16  SpO2: 100%  Weight: 243 lb (110.2 kg)  Height: 5' 7"$  (1.702 m)    Body mass index is 38.06 kg/m.  Physical Exam  Constitutional: Patient appears well-developed and well-nourished. Obese  No distress.  HEENT: head atraumatic, normocephalic, pupils equal and reactive to light, neck supple  Cardiovascular: Normal rate, regular rhythm and normal heart sounds.  No murmur heard. No BLE edema. Pulmonary/Chest: Effort normal and breath sounds normal. No respiratory distress. Abdominal: Soft.  There is no tenderness. Psychiatric: Patient has a normal mood and affect. behavior is normal. Judgment and thought content normal.   PHQ2/9:    05/27/2022    3:19 PM 03/04/2022    1:47 PM 08/13/2021    9:23 AM 04/23/2021    8:07 AM 11/20/2020   10:30 AM  Depression screen PHQ 2/9  Decreased Interest 0 0 0 0 0  Down, Depressed, Hopeless 0 0 0 0 0  PHQ - 2 Score 0 0 0 0 0  Altered sleeping 0 0 0 0 0  Tired, decreased energy 0 1 0 0 0  Change in appetite 0 1 0 0 0  Feeling bad or failure about yourself  0 0 0 0 0  Trouble concentrating 0 0 0 0 0  Moving slowly or fidgety/restless 0 0 0 0 0  Suicidal thoughts 0 0 0 0 0  PHQ-9 Score 0 2 0 0 0  Difficult doing work/chores  Not difficult at all       phq 9 is negative   Fall Risk:    05/27/2022    3:18 PM 03/04/2022    1:43 PM 08/13/2021    9:22 AM 04/23/2021    8:07 AM 11/20/2020   10:29 AM  Fall Risk    Falls in the past year? 0 0 0 0 0  Number falls in past yr: 0  0 0 0  Injury with Fall? 0  0 0 0  Risk for fall due to : No Fall Risks No Fall Risks No Fall Risks No Fall Risks No Fall Risks  Follow up Falls prevention discussed Falls prevention discussed;Education provided;Falls evaluation completed Falls prevention discussed Falls prevention discussed Falls prevention discussed      Functional Status Survey: Is the patient deaf or have difficulty hearing?: No Does the patient have difficulty seeing, even when wearing glasses/contacts?: No Does the patient have difficulty concentrating, remembering, or making decisions?: No Does the patient have difficulty walking or climbing stairs?: No Does the patient have difficulty dressing or bathing?: No Does the patient have difficulty doing errands alone such as visiting a doctor's office or shopping?: No    Assessment & Plan  1. Hypertension, benign  At goal today   2. Morbid obesity ([place])  Discussed with the patient the risk posed by an increased BMI. Discussed importance of portion control, calorie counting and at least 150 minutes of physical activity weekly. Avoid sweet beverages and drink more water. Eat at least 6 servings of fruit and vegetables daily    3. Major depression in remission [facility])  Doing well   4. B12 deficiency  - B12 and Folate Panel  5. Vitamin D deficiency  - VITAMIN D 25 Hydroxy (Vit-D Deficiency, Fractures)  6. Metabolic syndrome  - Hemoglobin A1c  7. Mild intermittent asthma, uncomplicated  Doing well   8. Perennial allergic rhinitis with seasonal variation   9. Long-term use of high-risk medication  - CBC with Differential/Platelet - COMPLETE METABOLIC PANEL WITH GFR  10. Lipid screening  - Lipid panel

## 2022-05-27 ENCOUNTER — Encounter: Payer: Self-pay | Admitting: Family Medicine

## 2022-05-27 ENCOUNTER — Ambulatory Visit (INDEPENDENT_AMBULATORY_CARE_PROVIDER_SITE_OTHER): Payer: Medicaid Other | Admitting: Family Medicine

## 2022-05-27 VITALS — BP 122/82 | HR 91 | Resp 16 | Ht 67.0 in | Wt 243.0 lb

## 2022-05-27 DIAGNOSIS — I10 Essential (primary) hypertension: Secondary | ICD-10-CM | POA: Diagnosis not present

## 2022-05-27 DIAGNOSIS — Z13220 Encounter for screening for lipoid disorders: Secondary | ICD-10-CM

## 2022-05-27 DIAGNOSIS — A6004 Herpesviral vulvovaginitis: Secondary | ICD-10-CM | POA: Diagnosis not present

## 2022-05-27 DIAGNOSIS — J4520 Mild intermittent asthma, uncomplicated: Secondary | ICD-10-CM

## 2022-05-27 DIAGNOSIS — K5909 Other constipation: Secondary | ICD-10-CM

## 2022-05-27 DIAGNOSIS — L309 Dermatitis, unspecified: Secondary | ICD-10-CM

## 2022-05-27 DIAGNOSIS — E559 Vitamin D deficiency, unspecified: Secondary | ICD-10-CM | POA: Diagnosis not present

## 2022-05-27 DIAGNOSIS — Z79899 Other long term (current) drug therapy: Secondary | ICD-10-CM

## 2022-05-27 DIAGNOSIS — E538 Deficiency of other specified B group vitamins: Secondary | ICD-10-CM | POA: Diagnosis not present

## 2022-05-27 DIAGNOSIS — F325 Major depressive disorder, single episode, in full remission: Secondary | ICD-10-CM

## 2022-05-27 DIAGNOSIS — L2082 Flexural eczema: Secondary | ICD-10-CM

## 2022-05-27 DIAGNOSIS — E8881 Metabolic syndrome: Secondary | ICD-10-CM | POA: Diagnosis not present

## 2022-05-27 DIAGNOSIS — J302 Other seasonal allergic rhinitis: Secondary | ICD-10-CM

## 2022-05-27 DIAGNOSIS — Z3041 Encounter for surveillance of contraceptive pills: Secondary | ICD-10-CM

## 2022-05-27 DIAGNOSIS — J3089 Other allergic rhinitis: Secondary | ICD-10-CM | POA: Diagnosis not present

## 2022-05-27 MED ORDER — DESOXIMETASONE 0.25 % EX CREA: 1.0000 | TOPICAL_CREAM | Freq: Two times a day (BID) | CUTANEOUS | 0 refills | Status: DC

## 2022-05-27 MED ORDER — ATENOLOL 25 MG PO TABS: 25.0000 mg | ORAL_TABLET | Freq: Every day | ORAL | 1 refills | Status: DC

## 2022-05-27 MED ORDER — POLYETHYLENE GLYCOL 3350 17 GM/SCOOP PO POWD: 17.0000 g | Freq: Two times a day (BID) | ORAL | 1 refills | Status: DC | PRN

## 2022-05-27 MED ORDER — MONTELUKAST SODIUM 10 MG PO TABS: ORAL_TABLET | ORAL | 1 refills | Status: DC

## 2022-05-27 MED ORDER — LORATADINE 10 MG PO TABS: 10.0000 mg | ORAL_TABLET | Freq: Every day | ORAL | 1 refills | Status: DC

## 2022-05-27 MED ORDER — FLUTICASONE PROPIONATE 50 MCG/ACT NA SUSP: 2.0000 | Freq: Every day | NASAL | 1 refills | Status: DC

## 2022-05-27 MED ORDER — ALBUTEROL SULFATE HFA 108 (90 BASE) MCG/ACT IN AERS: 2.0000 | INHALATION_SPRAY | Freq: Four times a day (QID) | RESPIRATORY_TRACT | 0 refills | Status: DC | PRN

## 2022-05-27 MED ORDER — VALACYCLOVIR HCL 500 MG PO TABS: 500.0000 mg | ORAL_TABLET | Freq: Every day | ORAL | 1 refills | Status: DC

## 2022-05-27 MED ORDER — VENLAFAXINE HCL ER 75 MG PO CP24: 75.0000 mg | ORAL_CAPSULE | Freq: Every day | ORAL | 1 refills | Status: DC

## 2022-05-27 MED ORDER — DESOGESTREL-ETHINYL ESTRADIOL 0.15-30 MG-MCG PO TABS: 1.0000 | ORAL_TABLET | Freq: Every day | ORAL | 3 refills | Status: DC

## 2022-05-28 LAB — COMPLETE METABOLIC PANEL WITH GFR
AG Ratio: 1.2 (calc) (ref 1.0–2.5)
ALT: 15 U/L (ref 6–29)
AST: 20 U/L (ref 10–30)
Albumin: 4.1 g/dL (ref 3.6–5.1)
Alkaline phosphatase (APISO): 45 U/L (ref 31–125)
BUN/Creatinine Ratio: 12 (calc) (ref 6–22)
BUN: 12 mg/dL (ref 7–25)
CO2: 21 mmol/L (ref 20–32)
Calcium: 9.6 mg/dL (ref 8.6–10.2)
Chloride: 108 mmol/L (ref 98–110)
Creat: 1.04 mg/dL — ABNORMAL HIGH (ref 0.50–0.96)
Globulin: 3.4 g/dL (calc) (ref 1.9–3.7)
Glucose, Bld: 76 mg/dL (ref 65–99)
Potassium: 4.3 mmol/L (ref 3.5–5.3)
Sodium: 139 mmol/L (ref 135–146)
Total Bilirubin: 0.5 mg/dL (ref 0.2–1.2)
Total Protein: 7.5 g/dL (ref 6.1–8.1)
eGFR: 75 mL/min/{1.73_m2} (ref >=60)

## 2022-05-28 LAB — VITAMIN D 25 HYDROXY (VIT D DEFICIENCY, FRACTURES): Vit D, 25-Hydroxy: 38 ng/mL (ref 30–100)

## 2022-05-28 LAB — B12 AND FOLATE PANEL
Folate: 11.4 ng/mL
Vitamin B-12: 731 pg/mL (ref 200–1100)

## 2022-05-28 LAB — CBC WITH DIFFERENTIAL/PLATELET
Absolute Monocytes: 624 cells/uL (ref 200–950)
Basophils Absolute: 47 cells/uL (ref 0–200)
Basophils Relative: 0.6 %
Eosinophils Absolute: 273 cells/uL (ref 15–500)
Eosinophils Relative: 3.5 %
HCT: 35.9 % (ref 35.0–45.0)
Hemoglobin: 12.2 g/dL (ref 11.7–15.5)
Lymphs Abs: 2215 cells/uL (ref 850–3900)
MCH: 28.6 pg (ref 27.0–33.0)
MCHC: 34 g/dL (ref 32.0–36.0)
MCV: 84.1 fL (ref 80.0–100.0)
MPV: 12.6 fL — ABNORMAL HIGH (ref 7.5–12.5)
Monocytes Relative: 8 %
Neutro Abs: 4641 cells/uL (ref 1500–7800)
Neutrophils Relative %: 59.5 %
Platelets: 205 10*3/uL (ref 140–400)
RBC: 4.27 10*6/uL (ref 3.80–5.10)
RDW: 13 % (ref 11.0–15.0)
Total Lymphocyte: 28.4 %
WBC: 7.8 10*3/uL (ref 3.8–10.8)

## 2022-05-28 LAB — LIPID PANEL
Cholesterol: 202 mg/dL — ABNORMAL HIGH (ref <200)
HDL: 71 mg/dL (ref >=50)
LDL Cholesterol (Calc): 106 mg/dL (calc) — ABNORMAL HIGH
Non-HDL Cholesterol (Calc): 131 mg/dL (calc) — ABNORMAL HIGH (ref <130)
Total CHOL/HDL Ratio: 2.8 (calc) (ref <5.0)
Triglycerides: 130 mg/dL (ref <150)

## 2022-05-28 LAB — HEMOGLOBIN A1C
Hgb A1c MFr Bld: 5.5 % of total Hgb (ref <5.7)
Mean Plasma Glucose: 111 mg/dL
eAG (mmol/L): 6.2 mmol/L

## 2022-06-24 NOTE — Patient Instructions (Signed)

## 2022-06-24 NOTE — Progress Notes (Unsigned)
Name: Rachel Dunlap   MRN: [id number]    DOB: May 15, 1995   Date:06/25/2022       Progress Note  Subjective  Chief Complaint  Annual Exam  HPI  Patient presents for annual CPE.  Diet: she has been eating more at home and eating smaller portions, she is doing intermittent fasting and has lost 8 lbs since last visit one month ago  Exercise: he is doing U-tube home videos for 30-40 minutes   Last Eye Exam: scheduled for next week  Last Dental Exam: next week   [place] Office Visit from 06/25/2022 in [facility]  AUDIT-C Score 2      Depression: Phq 9 is  negative    06/25/2022   10:38 AM 05/27/2022    3:19 PM 03/04/2022    1:47 PM 08/13/2021    9:23 AM 04/23/2021    8:07 AM  Depression screen PHQ 2/9  Decreased Interest 0 0 0 0 0  Down, Depressed, Hopeless 0 0 0 0 0  PHQ - 2 Score 0 0 0 0 0  Altered sleeping 0 0 0 0 0  Tired, decreased energy 0 0 1 0 0  Change in appetite 0 0 1 0 0  Feeling bad or failure about yourself  0 0 0 0 0  Trouble concentrating 0 0 0 0 0  Moving slowly or fidgety/restless 0 0 0 0 0  Suicidal thoughts 0 0 0 0 0  PHQ-9 Score 0 0 2 0 0  Difficult doing work/chores   Not difficult at all     Hypertension: BP Readings from Last 3 Encounters:  06/25/22 118/70  05/27/22 122/82  04/03/22 (!) 162/78   Obesity: Wt Readings from Last 3 Encounters:  06/25/22 235 lb (106.6 kg)  05/27/22 243 lb (110.2 kg)  03/04/22 248 lb 6.4 oz (112.7 kg)   BMI Readings from Last 3 Encounters:  06/25/22 36.81 kg/m  05/27/22 38.06 kg/m  03/04/22 38.33 kg/m     Vaccines:   HPV: up to date Tdap: up to date Shingrix: N/A Pneumonia: up to date  Flu: up to date COVID-19: up to date   Hep C Screening: up to date  STD testing and prevention (HIV/chl/gon/syphilis): today  Intimate partner violence: negative screen  Sexual History : one new sexual partner over the past month, no vaginal discharge or pain  Menstrual  History/LMP/Abnormal Bleeding: regular cycles, using ocp and condoms. LMP 06/08/2022  Discussed importance of follow up if any post-menopausal bleeding: not applicable  Incontinence Symptoms: negative for symptoms   Breast cancer:  - Last Mammogram: N/A - BRCA gene screening: N/A   Osteoporosis Prevention : Discussed high calcium and vitamin D supplementation, weight bearing exercises Bone density: N/A   Cervical cancer screening: 12/04/20 - today since new sexual partner   Skin cancer: Discussed monitoring for atypical lesions  Colorectal cancer: N/A   Lung cancer:  Low Dose CT Chest recommended if Age 29-80 years, 20 pack-year currently smoking OR have quit w/in 15years. Patient does not qualify for screen   ECG: 01/06/18  Advanced Care Planning: A voluntary discussion about advance care planning including the explanation and discussion of advance directives.  Discussed health care proxy and Living will, and the patient was able to identify a health care proxy as mother .  Patient does not have a living will and power of attorney of health care   Lipids: Lab Results  Component Value Date   CHOL 202 (H) 05/27/2022  CHOL 206 (H) 04/23/2021   CHOL 169 09/25/2019   Lab Results  Component Value Date   HDL 71 05/27/2022   HDL 72 04/23/2021   HDL 71 09/25/2019   Lab Results  Component Value Date   LDLCALC 106 (H) 05/27/2022   LDLCALC 116 (H) 04/23/2021   LDLCALC 84 09/25/2019   Lab Results  Component Value Date   TRIG 130 05/27/2022   TRIG 79 04/23/2021   TRIG 60 09/25/2019   Lab Results  Component Value Date   CHOLHDL 2.8 05/27/2022   CHOLHDL 2.9 04/23/2021   CHOLHDL 2.4 09/25/2019   No results found for: "LDLDIRECT"  Glucose: Glucose, Bld  Date Value Ref Range Status  05/27/2022 76 65 - 99 mg/dL Final    Comment:    .            Fasting reference interval .   04/23/2021 82 65 - 99 mg/dL Final    Comment:    .            Fasting reference interval .    09/25/2019 91 65 - 99 mg/dL Final    Comment:    .            Fasting reference interval .     Patient Active Problem List   Diagnosis Date Noted   B12 deficiency 08/13/2021   Obesity (BMI 30.0-34.9) 08/13/2021   Major depression in remission ([place]) 08/13/2021   Perennial allergic rhinitis with seasonal variation 08/13/2021   Eczema 08/31/2018   Insulin resistance 05/16/2018   Prediabetes 02/14/2018   Essential hypertension 02/14/2018   Back pain due to injury 09/18/2016   Acne vulgaris 09/16/2015   Hypertrichosis 09/16/2015   Chronic constipation 11/09/2014   Chronic dermatitis 123XX123   Metabolic syndrome 123XX123   Hypertension, benign 11/09/2014   Genital herpes in women 11/09/2014   Mild intermittent asthma, uncomplicated 123XX123   Allergic rhinitis, seasonal 11/09/2014   IBS (irritable bowel syndrome) 11/09/2014   Bilateral polycystic ovarian syndrome 11/09/2014   Abnormal presence of protein in urine 11/09/2014   Vitamin D deficiency 11/09/2014   Anxiety 11/09/2014    No past surgical history on file.  Family History  Problem Relation Age of Onset   Hypertension Mother    Obesity Mother    Cancer Father    Diabetes Father    Kidney disease Father    Obesity Maternal Grandmother     Social History   Socioeconomic History   Marital status: Single    Spouse name: Not on file   Number of children: 0   Years of education: Not on file   Highest education level: Some college, no degree  Occupational History   Occupation: gas station attendant.     Comment: [employer]  Tobacco Use   Smoking status: Never   Smokeless tobacco: Never  Vaping Use   Vaping Use: Never used  Substance and Sexual Activity   Alcohol use: Yes    Alcohol/week: 0.0 standard drinks of alcohol    Comment: seldom   Drug use: No   Sexual activity: Not Currently    Partners: Male    Birth control/protection: Pill, Abstinence  Other Topics Concern   Not on file  Social History  Narrative   She wemt to school at [employer] - graduated Dec  2019 - hospitality and tourism management - she wants to be chef-goal is to work at a [place].   She works part time at [employer]  Social Determinants of Health   Financial Resource Strain: Low Risk  (06/25/2022)   Overall Financial Resource Strain (CARDIA)    Difficulty of Paying Living Expenses: Not hard at all  Food Insecurity: No Food Insecurity (06/25/2022)   Hunger Vital Sign    Worried About Running Out of Food in the Last Year: Never true    Ran Out of Food in the Last Year: Never true  Transportation Needs: No Transportation Needs (06/25/2022)   PRAPARE - Hydrologist (Medical): No    Lack of Transportation (Non-Medical): No  Physical Activity: Sufficiently Active (06/25/2022)   Exercise Vital Sign    Days of Exercise per Week: 4 days    Minutes of Exercise per Session: 40 min  Stress: No Stress Concern Present (06/25/2022)   [place]    Feeling of Stress : Not at all  Social Connections: Moderately Integrated (06/25/2022)   Social Connection and Isolation Panel [NHANES]    Frequency of Communication with Friends and Family: More than three times a week    Frequency of Social Gatherings with Friends and Family: Three times a week    Attends Religious Services: More than 4 times per year    Active Member of Clubs or Organizations: Yes    Attends Archivist Meetings: 1 to 4 times per year    Marital Status: Never married  Intimate Partner Violence: Not At Risk (06/25/2022)   Humiliation, Afraid, Rape, and Kick questionnaire    Fear of Current or Ex-Partner: No    Emotionally Abused: No    Physically Abused: No    Sexually Abused: No     Current Outpatient Medications:    albuterol (VENTOLIN HFA) 108 (90 Base) MCG/ACT inhaler, Inhale 2 puffs into the lungs every 6 (six) hours as needed for wheezing  or shortness of breath., Disp: 6.7 each, Rfl: 0   atenolol (TENORMIN) 25 MG tablet, Take 1 tablet (25 mg total) by mouth daily., Disp: 90 tablet, Rfl: 1   Blood Pressure Monitoring (OMRON 5 SERIES BP MONITOR) DEVI, , Disp: , Rfl:    desogestrel-ethinyl estradiol (APRI) 0.15-30 MG-MCG tablet, Take 1 tablet by mouth daily., Disp: 84 tablet, Rfl: 3   desoximetasone (TOPICORT) 0.25 % cream, Apply 1 Application topically 2 (two) times daily., Disp: 100 g, Rfl: 0   enalapril (VASOTEC) 5 MG tablet, Take 1 tablet by mouth daily., Disp: , Rfl:    fluticasone (FLONASE) 50 MCG/ACT nasal spray, Place 2 sprays into both nostrils at bedtime., Disp: 48 g, Rfl: 1   ketoconazole (NIZORAL) 2 % cream, APPLY 1 APPLICATION TOPICALLY AT BEDTIME., Disp: 60 g, Rfl: 0   loratadine (CLARITIN) 10 MG tablet, Take 1 tablet (10 mg total) by mouth daily., Disp: 90 tablet, Rfl: 1   montelukast (SINGULAIR) 10 MG tablet, TAKE 1 TABLET BY MOUTH EVERYDAY AT BEDTIME, Disp: 90 tablet, Rfl: 1   polyethylene glycol powder (GLYCOLAX/MIRALAX) 17 GM/SCOOP powder, Take 17 g by mouth 2 (two) times daily as needed., Disp: 3350 g, Rfl: 1   triamcinolone cream (KENALOG) 0.1 %, Apply 1 Application topically 2 (two) times daily., Disp: 453.6 g, Rfl: 0   valACYclovir (VALTREX) 500 MG tablet, Take 1 tablet (500 mg total) by mouth daily. And twice daily on outbreaks, Disp: 115 tablet, Rfl: 1   venlafaxine XR (EFFEXOR XR) 75 MG 24 hr capsule, Take 1 capsule (75 mg total) by mouth daily with breakfast., Disp: 90  capsule, Rfl: 1  No Known Allergies   ROS  Constitutional: Negative for fever , positive for  weight change.  Respiratory: Negative for cough and shortness of breath.   Cardiovascular: Negative for chest pain or palpitations.  Gastrointestinal: Negative for abdominal pain, no bowel changes.  Musculoskeletal: Negative for gait problem or joint swelling.  Skin: Negative for rash.  Neurological: Negative for dizziness or headache.  No  other specific complaints in a complete review of systems (except as listed in HPI above).   Objective  Vitals:   06/25/22 1038  BP: 118/70  Pulse: 91  Resp: 16  SpO2: 99%  Weight: 235 lb (106.6 kg)  Height: 5\' 7"  (1.702 m)    Body mass index is 36.81 kg/m.  Physical Exam  Constitutional: Patient appears well-developed and well-nourished. Obesity  No distress.  HENT: Head: Normocephalic and atraumatic. Ears: B TMs ok, no erythema or effusion; Nose: Nose normal. Mouth/Throat: Oropharynx is clear and moist. No oropharyngeal exudate.  Eyes: Conjunctivae and EOM are normal. Pupils are equal, round, and reactive to light. No scleral icterus.  Neck: Normal range of motion. Neck supple. No JVD present. No thyromegaly present.  Cardiovascular: Normal rate, regular rhythm and normal heart sounds.  No murmur heard. No BLE edema. Pulmonary/Chest: Effort normal and breath sounds normal. No respiratory distress. Abdominal: Soft. Bowel sounds are normal, no distension. There is no tenderness. no masses Breast: no lumps or masses, no nipple discharge or rashes FEMALE GENITALIA:  External genitalia normal External urethra normal Vaginal vault normal without discharge or lesions Cervix normal without discharge or lesions Bimanual exam normal without masses RECTAL:not done  Musculoskeletal: Normal range of motion, no joint effusions. No gross deformities Neurological: he is alert and oriented to person, place, and time. No cranial nerve deficit. Coordination, balance, strength, speech and gait are normal.  Skin: Skin is warm and dry. No rash noted. No erythema.  Psychiatric: Patient has a normal mood and affect. behavior is normal. Judgment and thought content normal.   Recent Results (from the past 2160 hour(s))  Lipid panel     Status: Abnormal   Collection Time: 05/27/22  3:52 PM  Result Value Ref Range   Cholesterol 202 (H) <200 mg/dL   HDL 71 > OR = 50 mg/dL   Triglycerides 130 <150  mg/dL   LDL Cholesterol (Calc) 106 (H) mg/dL (calc)    Comment: Reference range: <100 . Desirable range <100 mg/dL for primary prevention;   <70 mg/dL for patients with CHD or diabetic patients  with > or = 2 CHD risk factors. Marland Kitchen LDL-C is now calculated using the Martin-Hopkins  calculation, which is a validated novel method providing  better accuracy than the Friedewald equation in the  estimation of LDL-C.  Cresenciano Genre et al. Annamaria Helling. [id number]): 2061-2068  (http://education.[facility].com/faq/FAQ164)    Total CHOL/HDL Ratio 2.8 <5.0 (calc)   Non-HDL Cholesterol (Calc) 131 (H) <130 mg/dL (calc)    Comment: For patients with diabetes plus 1 major ASCVD risk  factor, treating to a non-HDL-C goal of <100 mg/dL  (LDL-C of <70 mg/dL) is considered a therapeutic  option.   CBC with Differential/Platelet     Status: Abnormal   Collection Time: 05/27/22  3:52 PM  Result Value Ref Range   WBC 7.8 3.8 - 10.8 Thousand/uL   RBC 4.27 3.80 - 5.10 Million/uL   Hemoglobin 12.2 11.7 - 15.5 g/dL   HCT 35.9 35.0 - 45.0 %   MCV 84.1 80.0 - 100.0 fL  MCH 28.6 27.0 - 33.0 pg   MCHC 34.0 32.0 - 36.0 g/dL   RDW 13.0 11.0 - 15.0 %   Platelets 205 140 - 400 Thousand/uL   MPV 12.6 (H) 7.5 - 12.5 fL   Neutro Abs 4,641 1,500 - 7,800 cells/uL   Lymphs Abs 2,215 850 - 3,900 cells/uL   Absolute Monocytes 624 200 - 950 cells/uL   Eosinophils Absolute 273 15 - 500 cells/uL   Basophils Absolute 47 0 - 200 cells/uL   Neutrophils Relative % 59.5 %   Total Lymphocyte 28.4 %   Monocytes Relative 8.0 %   Eosinophils Relative 3.5 %   Basophils Relative 0.6 %  COMPLETE METABOLIC PANEL WITH GFR     Status: Abnormal   Collection Time: 05/27/22  3:52 PM  Result Value Ref Range   Glucose, Bld 76 65 - 99 mg/dL    Comment: .            Fasting reference interval .    BUN 12 7 - 25 mg/dL   Creat 1.04 (H) 0.50 - 0.96 mg/dL   eGFR 75 > OR = 60 mL/min/1.1m2   BUN/Creatinine Ratio 12 6 - 22 (calc)   Sodium  139 135 - 146 mmol/L   Potassium 4.3 3.5 - 5.3 mmol/L   Chloride 108 98 - 110 mmol/L   CO2 21 20 - 32 mmol/L   Calcium 9.6 8.6 - 10.2 mg/dL   Total Protein 7.5 6.1 - 8.1 g/dL   Albumin 4.1 3.6 - 5.1 g/dL   Globulin 3.4 1.9 - 3.7 g/dL (calc)   AG Ratio 1.2 1.0 - 2.5 (calc)   Total Bilirubin 0.5 0.2 - 1.2 mg/dL   Alkaline phosphatase (APISO) 45 31 - 125 U/L   AST 20 10 - 30 U/L   ALT 15 6 - 29 U/L  Hemoglobin A1c     Status: None   Collection Time: 05/27/22  3:52 PM  Result Value Ref Range   Hgb A1c MFr Bld 5.5 <5.7 % of total Hgb    Comment: For the purpose of screening for the presence of diabetes: . <5.7%       Consistent with the absence of diabetes 5.7-6.4%    Consistent with increased risk for diabetes             (prediabetes) > or =6.5%  Consistent with diabetes . This assay result is consistent with a decreased risk of diabetes. . Currently, no consensus exists regarding use of hemoglobin A1c for diagnosis of diabetes in children. . According to American Diabetes Association (ADA) guidelines, hemoglobin A1c <7.0% represents optimal control in non-pregnant diabetic patients. Different metrics may apply to specific patient populations.  Standards of Medical Care in Diabetes(ADA). .    Mean Plasma Glucose 111 mg/dL   eAG (mmol/L) 6.2 mmol/L    Comment: . HbA1c performed on Roche platform. Effective 01/12/22 a change in test platforms may have  shifted HbA1c results compared to historical results.   VITAMIN D 25 Hydroxy (Vit-D Deficiency, Fractures)     Status: None   Collection Time: 05/27/22  3:52 PM  Result Value Ref Range   Vit D, 25-Hydroxy 38 30 - 100 ng/mL    Comment: Vitamin D Status         25-OH Vitamin D: . Deficiency:                    <20 ng/mL Insufficiency:  20 - 29 ng/mL Optimal:                 > or = 30 ng/mL . For 25-OH Vitamin D testing on patients on  D2-supplementation and patients for whom quantitation  of D2 and D3  fractions is required, the [facility](TM) 25-OH VIT D, (D2,D3), LC/MS/MS is recommended: order  code [id number] (patients >2yrs). . See Note 1 . Note 1 . For additional information, please refer to  http://education.[facility].com/faq/FAQ199  (This link is being provided for informational/ educational purposes only.)   B12 and Folate Panel     Status: None   Collection Time: 05/27/22  3:52 PM  Result Value Ref Range   Vitamin B-12 731 200 - 1,100 pg/mL   Folate 11.4 ng/mL    Comment:                            Reference Range                            Low:           <3.4                            Borderline:    3.4-5.4                            Normal:        >5.4 .      Fall Risk:    06/25/2022   10:38 AM 05/27/2022    3:18 PM 03/04/2022    1:43 PM 08/13/2021    9:22 AM 04/23/2021    8:07 AM  Fall Risk   Falls in the past year? 0 0 0 0 0  Number falls in past yr: 0 0  0 0  Injury with Fall? 0 0  0 0  Risk for fall due to : No Fall Risks No Fall Risks No Fall Risks No Fall Risks No Fall Risks  Follow up Falls prevention discussed Falls prevention discussed Falls prevention discussed;Education provided;Falls evaluation completed Falls prevention discussed Falls prevention discussed     Functional Status Survey: Is the patient deaf or have difficulty hearing?: No Does the patient have difficulty seeing, even when wearing glasses/contacts?: No Does the patient have difficulty concentrating, remembering, or making decisions?: No Does the patient have difficulty walking or climbing stairs?: No Does the patient have difficulty dressing or bathing?: No Does the patient have difficulty doing errands alone such as visiting a doctor's office or shopping?: No   Assessment & Plan  1. Well adult exam  - RPR - HIV Antibody (routine testing w rflx)  2. Cervical cancer screening  - Cytology - PAP  3. Routine screening for STI (sexually transmitted infection)  -  RPR - HIV Antibody (routine testing w rflx)    -USPSTF grade A and B recommendations reviewed with patient; age-appropriate recommendations, preventive care, screening tests, etc discussed and encouraged; healthy living encouraged; see AVS for patient education given to patient -Discussed importance of 150 minutes of physical activity weekly, eat two servings of fish weekly, eat one serving of tree nuts ( cashews, pistachios, pecans, almonds.Marland Kitchen) every other day, eat 6 servings of fruit/vegetables daily and drink plenty of water and avoid sweet beverages.   -Reviewed Health Maintenance: Yes.

## 2022-06-25 ENCOUNTER — Other Ambulatory Visit (HOSPITAL_COMMUNITY)
Admission: RE | Admit: 2022-06-25 | Discharge: 2022-06-25 | Disposition: A | Discharge status: Home / Self Care | Payer: Medicaid Other | Source: Ambulatory Visit | Attending: Family Medicine | Admitting: Family Medicine

## 2022-06-25 ENCOUNTER — Encounter: Payer: Self-pay | Admitting: Family Medicine

## 2022-06-25 ENCOUNTER — Ambulatory Visit (INDEPENDENT_AMBULATORY_CARE_PROVIDER_SITE_OTHER): Payer: Medicaid Other | Admitting: Family Medicine

## 2022-06-25 ENCOUNTER — Telehealth: Payer: Self-pay | Admitting: Family Medicine

## 2022-06-25 VITALS — BP 118/70 | HR 91 | Resp 16 | Ht 67.0 in | Wt 235.0 lb

## 2022-06-25 DIAGNOSIS — Z113 Encounter for screening for infections with a predominantly sexual mode of transmission: Secondary | ICD-10-CM | POA: Diagnosis not present

## 2022-06-25 DIAGNOSIS — Z124 Encounter for screening for malignant neoplasm of cervix: Secondary | ICD-10-CM | POA: Insufficient documentation

## 2022-06-25 DIAGNOSIS — Z0000 Encounter for general adult medical examination without abnormal findings: Secondary | ICD-10-CM

## 2022-06-25 NOTE — Telephone Encounter (Signed)
Copied from [place] [id number]. Topic: General - Other >> Jun 25, 2022  3:00 PM Carrielelia G wrote: Reason for CRM: Genene from Cytology ...received a thinprep vial, patient name was not on the label, a name is needed. A name from the office is needed for this to be sent back and be relabeled

## 2022-06-26 NOTE — Telephone Encounter (Signed)
Returned call, awaiting return of sample for labeling.

## 2022-07-02 LAB — CYTOLOGY - PAP
Adequacy: ABSENT
Chlamydia: NEGATIVE
Comment: NEGATIVE
Comment: NORMAL
Diagnosis: NEGATIVE
Neisseria Gonorrhea: NEGATIVE

## 2022-09-03 ENCOUNTER — Encounter: Payer: Self-pay | Admitting: Family Medicine

## 2022-09-11 NOTE — Progress Notes (Unsigned)
Name: Rachel Dunlap   MRN: [id number]    DOB: 05-07-1995   Date:09/11/2022       Progress Note  Subjective  Chief Complaint  Completion [facility] Form  HPI  *** Patient Active Problem List   Diagnosis Date Noted   B12 deficiency 08/13/2021   Obesity (BMI 30.0-34.9) 08/13/2021   Major depression in remission ([facility]) 08/13/2021   Perennial allergic rhinitis with seasonal variation 08/13/2021   Eczema 08/31/2018   Insulin resistance 05/16/2018   Prediabetes 02/14/2018   Essential hypertension 02/14/2018   Back pain due to injury 09/18/2016   Acne vulgaris 09/16/2015   Hypertrichosis 09/16/2015   Chronic constipation 11/09/2014   Chronic dermatitis 11/09/2014   Metabolic syndrome 11/09/2014   Hypertension, benign 11/09/2014   Genital herpes in women 11/09/2014   Mild intermittent asthma, uncomplicated 11/09/2014   Allergic rhinitis, seasonal 11/09/2014   IBS (irritable bowel syndrome) 11/09/2014   Bilateral polycystic ovarian syndrome 11/09/2014   Abnormal presence of protein in urine 11/09/2014   Vitamin D deficiency 11/09/2014   Anxiety 11/09/2014    No past surgical history on file.  Family History  Problem Relation Age of Onset   Hypertension Mother    Obesity Mother    Cancer Father    Diabetes Father    Kidney disease Father    Obesity Maternal Grandmother     Social History   Tobacco Use   Smoking status: Never   Smokeless tobacco: Never  Substance Use Topics   Alcohol use: Yes    Alcohol/week: 0.0 standard drinks of alcohol    Comment: seldom     Current Outpatient Medications:    albuterol (VENTOLIN HFA) 108 (90 Base) MCG/ACT inhaler, Inhale 2 puffs into the lungs every 6 (six) hours as needed for wheezing or shortness of breath., Disp: 6.7 each, Rfl: 0   atenolol (TENORMIN) 25 MG tablet, Take 1 tablet (25 mg total) by mouth daily., Disp: 90 tablet, Rfl: 1   Blood Pressure Monitoring (OMRON 5 SERIES BP MONITOR) DEVI, , Disp: , Rfl:     desogestrel-ethinyl estradiol (APRI) 0.15-30 MG-MCG tablet, Take 1 tablet by mouth daily., Disp: 84 tablet, Rfl: 3   desoximetasone (TOPICORT) 0.25 % cream, Apply 1 Application topically 2 (two) times daily., Disp: 100 g, Rfl: 0   enalapril (VASOTEC) 5 MG tablet, Take 1 tablet by mouth daily., Disp: , Rfl:    fluticasone (FLONASE) 50 MCG/ACT nasal spray, Place 2 sprays into both nostrils at bedtime., Disp: 48 g, Rfl: 1   ketoconazole (NIZORAL) 2 % cream, APPLY 1 APPLICATION TOPICALLY AT BEDTIME., Disp: 60 g, Rfl: 0   loratadine (CLARITIN) 10 MG tablet, Take 1 tablet (10 mg total) by mouth daily., Disp: 90 tablet, Rfl: 1   montelukast (SINGULAIR) 10 MG tablet, TAKE 1 TABLET BY MOUTH EVERYDAY AT BEDTIME, Disp: 90 tablet, Rfl: 1   polyethylene glycol powder (GLYCOLAX/MIRALAX) 17 GM/SCOOP powder, Take 17 g by mouth 2 (two) times daily as needed., Disp: 3350 g, Rfl: 1   triamcinolone cream (KENALOG) 0.1 %, Apply 1 Application topically 2 (two) times daily., Disp: 453.6 g, Rfl: 0   valACYclovir (VALTREX) 500 MG tablet, Take 1 tablet (500 mg total) by mouth daily. And twice daily on outbreaks, Disp: 115 tablet, Rfl: 1   venlafaxine XR (EFFEXOR XR) 75 MG 24 hr capsule, Take 1 capsule (75 mg total) by mouth daily with breakfast., Disp: 90 capsule, Rfl: 1  No Known Allergies  I personally reviewed active problem list, medication list,  allergies, family history, social history, health maintenance with the patient/caregiver today.   ROS  ***  Objective  There were no vitals filed for this visit.  There is no height or weight on file to calculate BMI.  Physical Exam ***  Recent Results (from the past 2160 hour(s))  Cytology - PAP     Status: None   Collection Time: 06/25/22 10:52 AM  Result Value Ref Range   Neisseria Gonorrhea Negative    Chlamydia Negative    Adequacy      Satisfactory for evaluation; transformation zone component ABSENT.   Diagnosis      - Negative for intraepithelial  lesion or malignancy (NILM)   Comment Normal Reference Ranger Chlamydia - Negative    Comment      Normal Reference Range Neisseria Gonorrhea - Negative    PHQ2/9:    06/25/2022   10:38 AM 05/27/2022    3:19 PM 03/04/2022    1:47 PM 08/13/2021    9:23 AM 04/23/2021    8:07 AM  Depression screen PHQ 2/9  Decreased Interest 0 0 0 0 0  Down, Depressed, Hopeless 0 0 0 0 0  PHQ - 2 Score 0 0 0 0 0  Altered sleeping 0 0 0 0 0  Tired, decreased energy 0 0 1 0 0  Change in appetite 0 0 1 0 0  Feeling bad or failure about yourself  0 0 0 0 0  Trouble concentrating 0 0 0 0 0  Moving slowly or fidgety/restless 0 0 0 0 0  Suicidal thoughts 0 0 0 0 0  PHQ-9 Score 0 0 2 0 0  Difficult doing work/chores   Not difficult at all      phq 9 is {gen pos [id number]}   Fall Risk:    06/25/2022   10:38 AM 05/27/2022    3:18 PM 03/04/2022    1:43 PM 08/13/2021    9:22 AM 04/23/2021    8:07 AM  Fall Risk   Falls in the past year? 0 0 0 0 0  Number falls in past yr: 0 0  0 0  Injury with Fall? 0 0  0 0  Risk for fall due to : No Fall Risks No Fall Risks No Fall Risks No Fall Risks No Fall Risks  Follow up Falls prevention discussed Falls prevention discussed Falls prevention discussed;Education provided;Falls evaluation completed Falls prevention discussed Falls prevention discussed      Functional Status Survey:      Assessment & Plan  *** There are no diagnoses linked to this encounter.

## 2022-09-14 ENCOUNTER — Encounter: Payer: Self-pay | Admitting: Family Medicine

## 2022-09-14 ENCOUNTER — Ambulatory Visit (INDEPENDENT_AMBULATORY_CARE_PROVIDER_SITE_OTHER): Payer: Medicaid Other | Admitting: Family Medicine

## 2022-09-14 DIAGNOSIS — Z111 Encounter for screening for respiratory tuberculosis: Secondary | ICD-10-CM

## 2022-09-14 DIAGNOSIS — E88819 Insulin resistance, unspecified: Secondary | ICD-10-CM

## 2022-09-16 LAB — QUANTIFERON-TB GOLD PLUS
Mitogen-NIL: >10 IU/mL
NIL: 0.03 IU/mL
QuantiFERON-TB Gold Plus: NEGATIVE
TB1-NIL: 0.02 IU/mL
TB2-NIL: 0.06 IU/mL

## 2022-11-25 ENCOUNTER — Ambulatory Visit: Payer: Medicaid Other | Admitting: Family Medicine

## 2022-12-08 NOTE — Progress Notes (Signed)
Name: Rachel Dunlap   MRN: [id number]    DOB: [birth year]   Date:12/09/2022       Progress Note  Subjective  Chief Complaint  Follow Up  HPI  Morbid Obesity: BMI above 35 with co-morbidities such as HTN, and metabolic syndrome and depression .  She has a long history of obesity ( since childhood) max weight was 265 lbs . started with l life style modification and in 11/2019 her weight was 248 lbs started on Ozempic 02/2020 for insulin resistance and obesity, her weight was 236 lbs at the time with a BMI of 37. Weight went down to 221 lbs but she has been out of Ozempic since Summer 2023 and weight is up to 248 lbs. She used to take for pre-diabetes but insurance declined. She states she changed insurance and will see if she can take [facility] - but since there is a Therapist, art - we sent Contrave but not covered by insurance. She has Medicaid now, she is going to the gym, cooking most meals at home also doing intermittent fasting and weight is down 9 lbs in the past 3 months. Explained Reginal Lutes is now covered by Medicaid and she would like to resume that . Today her weight is down to 235.8 lbs and BMI is 36   Depression Major: she is back in therapy with [facility] since Summer 2021 , she stopped taking Effexor on her own Spring 2023 because she was feeling well, her father died in 01-04-17 her half sister from father's side had a baby in October and she gets emotional when she sees her dad's side of the family, she feels like she is grieving again. She decided to resume Effexor in 2022/01/04. 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   HTN: She sees  Dr. Ronn Melena /nephrologist once a year. She is taking vasotec because of microalbuminuria and also Atenolol.  She is aware of teratogenic effects of ACE . Doing well on BP    Pre-diabetes: she denies polyphagia, polydipsia or  polyuria. Fasting insulin was elevated, she has been physically active and eating healthy, last A1C was norma at 5.2 %  she lost 5 lbs since last visit    AR: doing well at this time, taking medications, no rhinorrhea or congestion. Taking singulair and loratadine. We will send refills.   Asthma: she is taking singulair daily, denies wheezing, cough or sob lately.   Genital herpes: she takes valtrex daily, she states last outbreak was just the initial outbreak, she is now ready to try stopping Valtrex, advised to pick up rx to take prn for outbreaks    Constipation: long history of constipation,  she is on prn miralax and drinking more water , when she takes miralax daily she is able to have bowel movements daily, currently a few times a week    Eczema: uses Topicort prn , she uses medication for flares intermittently . Stable   Patient Active Problem List   Diagnosis Date Noted   B12 deficiency 08/13/2021   Obesity (BMI 30.0-34.9) 08/13/2021   Major depression in remission (HCC) 08/13/2021   Perennial allergic rhinitis with seasonal variation 08/13/2021   Eczema 08/31/2018   Insulin resistance 05/16/2018   Prediabetes 02/14/2018   Essential hypertension 02/14/2018   Back pain due to injury 09/18/2016   Acne vulgaris 09/16/2015   Hypertrichosis  09/16/2015   Chronic constipation 11/09/2014   Chronic dermatitis 11/09/2014   Metabolic syndrome 11/09/2014   Hypertension, benign 11/09/2014   Genital herpes in women 11/09/2014   Mild intermittent asthma, uncomplicated 11/09/2014   Allergic rhinitis, seasonal 11/09/2014   IBS (irritable bowel syndrome) 11/09/2014   Bilateral polycystic ovarian syndrome 11/09/2014   Abnormal presence of protein in urine 11/09/2014   Vitamin D deficiency 11/09/2014   Anxiety 11/09/2014    History reviewed. No pertinent surgical history.  Family History  Problem Relation Age of Onset   Hypertension Mother    Obesity Mother    Cancer Father     Diabetes Father    Kidney disease Father    Obesity Maternal Grandmother     Social History   Tobacco Use   Smoking status: Never   Smokeless tobacco: Never  Substance Use Topics   Alcohol use: Yes    Alcohol/week: 0.0 standard drinks of alcohol    Comment: seldom     Current Outpatient Medications:    albuterol (VENTOLIN HFA) 108 (90 Base) MCG/ACT inhaler, Inhale 2 puffs into the lungs every 6 (six) hours as needed for wheezing or shortness of breath., Disp: 6.7 each, Rfl: 0   atenolol (TENORMIN) 25 MG tablet, Take 1 tablet (25 mg total) by mouth daily., Disp: 90 tablet, Rfl: 1   Blood Pressure Monitoring (OMRON 5 SERIES BP MONITOR) DEVI, , Disp: , Rfl:    desogestrel-ethinyl estradiol (APRI) 0.15-30 MG-MCG tablet, Take 1 tablet by mouth daily., Disp: 84 tablet, Rfl: 3   desoximetasone (TOPICORT) 0.25 % cream, Apply 1 Application topically 2 (two) times daily., Disp: 100 g, Rfl: 0   enalapril (VASOTEC) 5 MG tablet, Take 1 tablet by mouth daily., Disp: , Rfl:    fluticasone (FLONASE) 50 MCG/ACT nasal spray, Place 2 sprays into both nostrils at bedtime., Disp: 48 g, Rfl: 1   ketoconazole (NIZORAL) 2 % cream, APPLY 1 APPLICATION TOPICALLY AT BEDTIME., Disp: 60 g, Rfl: 0   loratadine (CLARITIN) 10 MG tablet, Take 1 tablet (10 mg total) by mouth daily., Disp: 90 tablet, Rfl: 1   montelukast (SINGULAIR) 10 MG tablet, TAKE 1 TABLET BY MOUTH EVERYDAY AT BEDTIME, Disp: 90 tablet, Rfl: 1   polyethylene glycol powder (GLYCOLAX/MIRALAX) 17 GM/SCOOP powder, Take 17 g by mouth 2 (two) times daily as needed., Disp: 3350 g, Rfl: 1   triamcinolone cream (KENALOG) 0.1 %, Apply 1 Application topically 2 (two) times daily., Disp: 453.6 g, Rfl: 0   valACYclovir (VALTREX) 500 MG tablet, Take 1 tablet (500 mg total) by mouth daily. And twice daily on outbreaks, Disp: 115 tablet, Rfl: 1   venlafaxine XR (EFFEXOR XR) 75 MG 24 hr capsule, Take 1 capsule (75 mg total) by mouth daily with breakfast., Disp: 90  capsule, Rfl: 1  No Known Allergies  I personally reviewed active problem list, medication list, allergies, family history, social history, health maintenance with the patient/caregiver today.   ROS  Ten systems reviewed and is negative except as mentioned in HPI    Objective  Vitals:   12/09/22 1456  BP: 126/74  Pulse: 79  Resp: 14  Temp: 98 F (36.7 C)  TempSrc: Oral  SpO2: 93%  Weight: 235 lb 12.8 oz (107 kg)  Height: 5' 7.5" (1.715 m)    Body mass index is 36.39 kg/m.  Physical Exam  Constitutional: Patient appears well-developed and well-nourished. Obese No distress.  HEENT: head atraumatic, normocephalic, pupils equal and reactive to light, , neck supple,  throat within normal limits Cardiovascular: Normal rate, regular rhythm and normal heart sounds.  No murmur heard. No BLE edema. Pulmonary/Chest: Effort normal and breath sounds normal. No respiratory distress. Abdominal: Soft.  There is no tenderness. Psychiatric: Patient has a normal mood and affect. behavior is normal. Judgment and thought content normal.    PHQ2/9:    12/09/2022    2:57 PM 09/14/2022    1:30 PM 06/25/2022   10:38 AM 05/27/2022    3:19 PM 03/04/2022    1:47 PM  Depression screen PHQ 2/9  Decreased Interest 0 0 0 0 0  Down, Depressed, Hopeless 0 0 0 0 0  PHQ - 2 Score 0 0 0 0 0  Altered sleeping 0 0 0 0 0  Tired, decreased energy 1 0 0 0 1  Change in appetite 0 0 0 0 1  Feeling bad or failure about yourself  0 0 0 0 0  Trouble concentrating 0 0 0 0 0  Moving slowly or fidgety/restless 0 0 0 0 0  Suicidal thoughts 0 0 0 0 0  PHQ-9 Score 1 0 0 0 2  Difficult doing work/chores Not difficult at all Not difficult at all   Not difficult at all    phq 9 is negative   Fall Risk:    12/09/2022    2:57 PM 09/14/2022    1:30 PM 06/25/2022   10:38 AM 05/27/2022    3:18 PM 03/04/2022    1:43 PM  Fall Risk   Falls in the past year? 0 0 0 0 0  Number falls in past yr:  0 0 0   Injury with  Fall?  0 0 0   Risk for fall due to : No Fall Risks  No Fall Risks No Fall Risks No Fall Risks  Follow up Falls prevention discussed  Falls prevention discussed Falls prevention discussed Falls prevention discussed;Education provided;Falls evaluation completed      Functional Status Survey: Is the patient deaf or have difficulty hearing?: No Does the patient have difficulty seeing, even when wearing glasses/contacts?: No Does the patient have difficulty concentrating, remembering, or making decisions?: No Does the patient have difficulty walking or climbing stairs?: No Does the patient have difficulty dressing or bathing?: No Does the patient have difficulty doing errands alone such as visiting a doctor's office or shopping?: No    Assessment & Plan  1. Major depression in remission (HCC)  - venlafaxine XR (EFFEXOR XR) 75 MG 24 hr capsule; Take 1 capsule (75 mg total) by mouth daily with breakfast.  Dispense: 90 capsule; Refill: 1  2. Morbid obesity (HCC)  - Semaglutide-Weight Management 0.25 MG/0.5ML SOAJ; Inject 0.25 mg into the skin once a week for 28 days.  Dispense: 2 mL; Refill: 0 - Semaglutide-Weight Management 0.5 MG/0.5ML SOAJ; Inject 0.5 mg into the skin once a week for 28 days.  Dispense: 2 mL; Refill: 0 - Semaglutide-Weight Management 1 MG/0.5ML SOAJ; Inject 1 mg into the skin once a week for 28 days.  Dispense: 2 mL; Refill: 0  3. Herpes simplex vulvovaginitis  - valACYclovir (VALTREX) 500 MG tablet; Take 1 tablet (500 mg total) by mouth daily. And twice daily on outbreaks  Dispense: 115 tablet; Refill: 1  4. Mild intermittent asthma, uncomplicated  - montelukast (SINGULAIR) 10 MG tablet; TAKE 1 TABLET BY MOUTH EVERYDAY AT BEDTIME  Dispense: 90 tablet; Refill: 1  5. Hypertension, benign  - atenolol (TENORMIN) 25 MG tablet; Take 1 tablet (25 mg total)  by mouth daily.  Dispense: 90 tablet; Refill: 1  6. Perennial allergic rhinitis with seasonal variation  -  loratadine (CLARITIN) 10 MG tablet; Take 1 tablet (10 mg total) by mouth daily.  Dispense: 90 tablet; Refill: 1  7. Chronic constipation  - polyethylene glycol powder (GLYCOLAX/MIRALAX) 17 GM/SCOOP powder; Take 17 g by mouth 2 (two) times daily as needed.  Dispense: 3350 g; Refill: 1  8. Other eczema  - triamcinolone cream (KENALOG) 0.1 %; Apply 1 Application topically 2 (two) times daily.  Dispense: 453.6 g; Refill: 0

## 2022-12-09 ENCOUNTER — Ambulatory Visit (INDEPENDENT_AMBULATORY_CARE_PROVIDER_SITE_OTHER): Payer: Medicaid Other | Admitting: Family Medicine

## 2022-12-09 DIAGNOSIS — L308 Other specified dermatitis: Secondary | ICD-10-CM | POA: Diagnosis not present

## 2022-12-09 DIAGNOSIS — I10 Essential (primary) hypertension: Secondary | ICD-10-CM

## 2022-12-09 DIAGNOSIS — J3089 Other allergic rhinitis: Secondary | ICD-10-CM

## 2022-12-09 DIAGNOSIS — J302 Other seasonal allergic rhinitis: Secondary | ICD-10-CM

## 2022-12-09 DIAGNOSIS — J4520 Mild intermittent asthma, uncomplicated: Secondary | ICD-10-CM

## 2022-12-09 DIAGNOSIS — A6004 Herpesviral vulvovaginitis: Secondary | ICD-10-CM | POA: Diagnosis not present

## 2022-12-09 DIAGNOSIS — F325 Major depressive disorder, single episode, in full remission: Secondary | ICD-10-CM

## 2022-12-09 DIAGNOSIS — K5909 Other constipation: Secondary | ICD-10-CM | POA: Diagnosis not present

## 2022-12-09 MED ORDER — ATENOLOL 25 MG PO TABS: 25.0000 mg | ORAL_TABLET | Freq: Every day | ORAL | 1 refills | Status: DC

## 2022-12-09 MED ORDER — SEMAGLUTIDE-WEIGHT MANAGEMENT 0.25 MG/0.5ML ~~LOC~~ SOAJ: 0.2500 mg | SUBCUTANEOUS | 0 refills | Status: AC

## 2022-12-09 MED ORDER — SEMAGLUTIDE-WEIGHT MANAGEMENT 0.5 MG/0.5ML ~~LOC~~ SOAJ: 0.5000 mg | SUBCUTANEOUS | 0 refills | Status: DC

## 2022-12-09 MED ORDER — LORATADINE 10 MG PO TABS: 10.0000 mg | ORAL_TABLET | Freq: Every day | ORAL | 1 refills | Status: DC

## 2022-12-09 MED ORDER — TRIAMCINOLONE ACETONIDE 0.1 % EX CREA: 1.0000 | TOPICAL_CREAM | Freq: Two times a day (BID) | CUTANEOUS | 0 refills | Status: DC

## 2022-12-09 MED ORDER — SEMAGLUTIDE-WEIGHT MANAGEMENT 1 MG/0.5ML ~~LOC~~ SOAJ
1.0000 mg | SUBCUTANEOUS | 0 refills | Status: AC
Start: 2023-02-05 — End: 2023-03-05

## 2022-12-09 MED ORDER — VENLAFAXINE HCL ER 75 MG PO CP24: 75.0000 mg | ORAL_CAPSULE | Freq: Every day | ORAL | 1 refills | Status: DC

## 2022-12-09 MED ORDER — POLYETHYLENE GLYCOL 3350 17 GM/SCOOP PO POWD: 17.0000 g | Freq: Two times a day (BID) | ORAL | 1 refills | Status: DC | PRN

## 2022-12-09 MED ORDER — VALACYCLOVIR HCL 500 MG PO TABS: 500.0000 mg | ORAL_TABLET | Freq: Every day | ORAL | 1 refills | Status: AC

## 2022-12-09 MED ORDER — MONTELUKAST SODIUM 10 MG PO TABS: ORAL_TABLET | ORAL | 1 refills | Status: DC

## 2022-12-31 DIAGNOSIS — I10 Essential (primary) hypertension: Secondary | ICD-10-CM | POA: Diagnosis not present

## 2022-12-31 DIAGNOSIS — R0683 Snoring: Secondary | ICD-10-CM | POA: Diagnosis not present

## 2022-12-31 DIAGNOSIS — R809 Proteinuria, unspecified: Secondary | ICD-10-CM | POA: Diagnosis not present

## 2023-01-05 DIAGNOSIS — R809 Proteinuria, unspecified: Secondary | ICD-10-CM | POA: Diagnosis not present

## 2023-01-05 DIAGNOSIS — I10 Essential (primary) hypertension: Secondary | ICD-10-CM | POA: Diagnosis not present

## 2023-01-11 ENCOUNTER — Ambulatory Visit (INDEPENDENT_AMBULATORY_CARE_PROVIDER_SITE_OTHER): Payer: Medicaid Other

## 2023-01-11 DIAGNOSIS — Z23 Encounter for immunization: Secondary | ICD-10-CM | POA: Diagnosis not present

## 2023-02-23 ENCOUNTER — Other Ambulatory Visit: Payer: Self-pay | Admitting: Family Medicine

## 2023-02-24 ENCOUNTER — Other Ambulatory Visit: Payer: Self-pay | Admitting: Family Medicine

## 2023-02-24 NOTE — Telephone Encounter (Signed)
Pt is interested to increase dose

## 2023-02-24 NOTE — Telephone Encounter (Signed)
Left vm

## 2023-03-09 NOTE — Progress Notes (Unsigned)
Name: Rachel Dunlap   MRN: [id number]    DOB: [birth year]   Date:03/09/2023       Progress Note  Subjective  Chief Complaint  No chief complaint on file.   HPI  *** Patient Active Problem List   Diagnosis Date Noted   B12 deficiency 08/13/2021   Obesity (BMI 30.0-34.9) 08/13/2021   Major depression in remission (HCC) 08/13/2021   Perennial allergic rhinitis with seasonal variation 08/13/2021   Eczema 08/31/2018   Insulin resistance 05/16/2018   Prediabetes 02/14/2018   Essential hypertension 02/14/2018   Back pain due to injury 09/18/2016   Acne vulgaris 09/16/2015   Hypertrichosis 09/16/2015   Chronic constipation 11/09/2014   Chronic dermatitis 11/09/2014   Metabolic syndrome 11/09/2014   Hypertension, benign 11/09/2014   Genital herpes in women 11/09/2014   Mild intermittent asthma, uncomplicated 11/09/2014   Allergic rhinitis, seasonal 11/09/2014   IBS (irritable bowel syndrome) 11/09/2014   Bilateral polycystic ovarian syndrome 11/09/2014   Abnormal presence of protein in urine 11/09/2014   Vitamin D deficiency 11/09/2014   Anxiety 11/09/2014    No past surgical history on file.  Family History  Problem Relation Age of Onset   Hypertension Mother    Obesity Mother    Cancer Father    Diabetes Father    Kidney disease Father    Obesity Maternal Grandmother     Social History   Tobacco Use   Smoking status: Never   Smokeless tobacco: Never  Substance Use Topics   Alcohol use: Yes    Alcohol/week: 0.0 standard drinks of alcohol    Comment: seldom     Current Outpatient Medications:    albuterol (VENTOLIN HFA) 108 (90 Base) MCG/ACT inhaler, Inhale 2 puffs into the lungs every 6 (six) hours as needed for wheezing or shortness of breath., Disp: 6.7 each, Rfl: 0   atenolol (TENORMIN) 25 MG tablet, Take 1 tablet (25 mg total) by mouth daily., Disp: 90 tablet, Rfl: 1   Blood Pressure Monitoring (OMRON 5 SERIES BP MONITOR) DEVI, , Disp: , Rfl:     desogestrel-ethinyl estradiol (APRI) 0.15-30 MG-MCG tablet, Take 1 tablet by mouth daily., Disp: 84 tablet, Rfl: 3   desoximetasone (TOPICORT) 0.25 % cream, Apply 1 Application topically 2 (two) times daily., Disp: 100 g, Rfl: 0   enalapril (VASOTEC) 5 MG tablet, Take 1 tablet by mouth daily., Disp: , Rfl:    fluticasone (FLONASE) 50 MCG/ACT nasal spray, Place 2 sprays into both nostrils at bedtime., Disp: 48 g, Rfl: 1   ketoconazole (NIZORAL) 2 % cream, APPLY 1 APPLICATION TOPICALLY AT BEDTIME., Disp: 60 g, Rfl: 0   loratadine (CLARITIN) 10 MG tablet, Take 1 tablet (10 mg total) by mouth daily., Disp: 90 tablet, Rfl: 1   montelukast (SINGULAIR) 10 MG tablet, TAKE 1 TABLET BY MOUTH EVERYDAY AT BEDTIME, Disp: 90 tablet, Rfl: 1   polyethylene glycol powder (GLYCOLAX/MIRALAX) 17 GM/SCOOP powder, Take 17 g by mouth 2 (two) times daily as needed., Disp: 3350 g, Rfl: 1   triamcinolone cream (KENALOG) 0.1 %, Apply 1 Application topically 2 (two) times daily., Disp: 453.6 g, Rfl: 0   valACYclovir (VALTREX) 500 MG tablet, Take 1 tablet (500 mg total) by mouth daily. And twice daily on outbreaks, Disp: 115 tablet, Rfl: 1   venlafaxine XR (EFFEXOR XR) 75 MG 24 hr capsule, Take 1 capsule (75 mg total) by mouth daily with breakfast., Disp: 90 capsule, Rfl: 1  No Known Allergies  I personally reviewed {Reviewed:14835} with the  patient/caregiver today.   ROS  ***  Objective  There were no vitals filed for this visit.  There is no height or weight on file to calculate BMI.  Physical Exam ***  No results found for this or any previous visit (from the past 2160 hour(s)).  Diabetic Foot Exam: Diabetic Foot Exam - Simple   No data filed    ***  PHQ2/9:    12/09/2022    2:57 PM 09/14/2022    1:30 PM 06/25/2022   10:38 AM 05/27/2022    3:19 PM 03/04/2022    1:47 PM  Depression screen PHQ 2/9  Decreased Interest 0 0 0 0 0  Down, Depressed, Hopeless 0 0 0 0 0  PHQ - 2 Score 0 0 0 0 0  Altered  sleeping 0 0 0 0 0  Tired, decreased energy 1 0 0 0 1  Change in appetite 0 0 0 0 1  Feeling bad or failure about yourself  0 0 0 0 0  Trouble concentrating 0 0 0 0 0  Moving slowly or fidgety/restless 0 0 0 0 0  Suicidal thoughts 0 0 0 0 0  PHQ-9 Score 1 0 0 0 2  Difficult doing work/chores Not difficult at all Not difficult at all   Not difficult at all    phq 9 is {gen pos [id number]} ***  Fall Risk:    12/09/2022    2:57 PM 09/14/2022    1:30 PM 06/25/2022   10:38 AM 05/27/2022    3:18 PM 03/04/2022    1:43 PM  Fall Risk   Falls in the past year? 0 0 0 0 0  Number falls in past yr:  0 0 0   Injury with Fall?  0 0 0   Risk for fall due to : No Fall Risks  No Fall Risks No Fall Risks No Fall Risks  Follow up Falls prevention discussed  Falls prevention discussed Falls prevention discussed Falls prevention discussed;Education provided;Falls evaluation completed   ***   Functional Status Survey:   ***   Assessment & Plan  *** There are no diagnoses linked to this encounter.

## 2023-03-17 ENCOUNTER — Ambulatory Visit: Payer: Medicaid Other | Admitting: Family Medicine

## 2023-04-13 ENCOUNTER — Ambulatory Visit: Payer: Medicaid Other | Admitting: Family Medicine

## 2023-04-13 ENCOUNTER — Encounter: Payer: Self-pay | Admitting: Family Medicine

## 2023-04-13 DIAGNOSIS — F325 Major depressive disorder, single episode, in full remission: Secondary | ICD-10-CM | POA: Diagnosis not present

## 2023-04-13 DIAGNOSIS — E8881 Metabolic syndrome: Secondary | ICD-10-CM

## 2023-04-13 DIAGNOSIS — I10 Essential (primary) hypertension: Secondary | ICD-10-CM | POA: Diagnosis not present

## 2023-04-13 DIAGNOSIS — J4520 Mild intermittent asthma, uncomplicated: Secondary | ICD-10-CM | POA: Diagnosis not present

## 2023-04-13 MED ORDER — SEMAGLUTIDE-WEIGHT MANAGEMENT 1.7 MG/0.75ML ~~LOC~~ SOAJ
1.7000 mg | SUBCUTANEOUS | 0 refills | Status: AC
Start: 2023-07-09 — End: 2023-08-06

## 2023-04-13 MED ORDER — SEMAGLUTIDE-WEIGHT MANAGEMENT 1 MG/0.5ML ~~LOC~~ SOAJ
1.0000 mg | SUBCUTANEOUS | 0 refills | Status: DC
Start: 2023-06-10 — End: 2023-06-29

## 2023-04-13 NOTE — Progress Notes (Signed)
 Name: Rachel Dunlap   MRN: [id number]    DOB: 19-Apr-1995   Date:04/13/2023       Progress Note  Subjective  Chief Complaint  Chief Complaint  Patient presents with   Medical Management of Chronic Issues    Pt started wegovy  a little late currently on 0.5 dose    HPI  Morbid Obesity: BMI above 35 with co-morbidities such as HTN, and metabolic syndrome and depression .  She has a long history of obesity ( since childhood) max weight was 265 lbs . started with l life style modification and in 11/2019 her weight was 248 lbs started on Ozempic  02/2020 for insulin  resistance and obesity, her weight was 236 lbs at the time with a BMI of 37. Weight went down to 221 lbs but she has been out of Ozempic  since Summer 2023 and weight is up to 248 lbs. She used to take for pre-diabetes but insurance declined. She states she changed insurance and will see if she can take Wegovy  - but since there is a therapist, art - we sent Contrave  but not covered by insurance. She has Medicaid now, she is going to the gym, cooking most meals at home also doing intermittent fasting and weight and lost 9 lbs on her own before her last visit, we sent Wegovy  to her pharmacy in September, but it was difficulty to start medications, she is now on second dose and weight is unchanged, explained she needs to lose 5% of her weight by next visit . She states not as compliant with her diet over the holidays but is doing better now. 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 Lavinia /nephrologist once a year. She is taking vasotec because of microalbuminuria and also Atenolol .  She is aware of teratogenic effects of ACE . BP is at goal     Pre-diabetes: she denies polyphagia, polydipsia or polyuria. Fasting insulin  was elevated, she has been physically active and eating healthy, lA1C has normalized, taking GLP-1 agonist and trying to lose weight    AR: doing well at this time, taking medications, no rhinorrhea or congestion. Taking singulair  and loratadine . Continue current regiment   Asthma: she is taking singulair  daily, denies wheezing, cough or sob lately.Unchanged   Genital herpes: not currently sexually active, and only had one outbreak in her life, she is now on prn medication only    Constipation: long history of constipation,  she is on prn miralax  and drinking more water , when she takes miralax  daily she is able to have bowel movements daily, she states drinking more water has helped   Eczema: uses Topicort  prn , she uses medication for flares intermittently . No rashes at this time       Patient Active Problem List   Diagnosis Date Noted   B12 deficiency 08/13/2021   Obesity (BMI 30.0-34.9) 08/13/2021   Major depression in remission (HCC) 08/13/2021   Perennial allergic rhinitis with seasonal variation 08/13/2021   Eczema 08/31/2018   Insulin  resistance 05/16/2018   Prediabetes 02/14/2018   Essential hypertension 02/14/2018   Back pain due to injury 09/18/2016   Acne vulgaris 09/16/2015  Hypertrichosis 09/16/2015   Chronic constipation 11/09/2014   Chronic dermatitis 11/09/2014   Metabolic syndrome 11/09/2014   Hypertension, benign 11/09/2014   Genital herpes in women 11/09/2014   Mild intermittent asthma, uncomplicated 11/09/2014   Allergic rhinitis, seasonal 11/09/2014   IBS (irritable bowel syndrome) 11/09/2014   Bilateral polycystic ovarian syndrome 11/09/2014   Abnormal presence of protein in urine 11/09/2014   Vitamin D  deficiency 11/09/2014   Anxiety 11/09/2014    History reviewed. No pertinent surgical history.  Family History  Problem Relation Age of Onset   Hypertension Mother     Obesity Mother    Cancer Father    Diabetes Father    Kidney disease Father    Obesity Maternal Grandmother     Social History   Tobacco Use   Smoking status: Never   Smokeless tobacco: Never  Substance Use Topics   Alcohol use: Yes    Alcohol/week: 0.0 standard drinks of alcohol    Comment: seldom     Current Outpatient Medications:    albuterol  (VENTOLIN  HFA) 108 (90 Base) MCG/ACT inhaler, Inhale 2 puffs into the lungs every 6 (six) hours as needed for wheezing or shortness of breath., Disp: 6.7 each, Rfl: 0   atenolol  (TENORMIN ) 25 MG tablet, Take 1 tablet (25 mg total) by mouth daily., Disp: 90 tablet, Rfl: 1   Blood Pressure Monitoring (OMRON 5 SERIES BP MONITOR) DEVI, , Disp: , Rfl:    desogestrel -ethinyl estradiol  (APRI ) 0.15-30 MG-MCG tablet, Take 1 tablet by mouth daily., Disp: 84 tablet, Rfl: 3   desoximetasone  (TOPICORT ) 0.25 % cream, Apply 1 Application topically 2 (two) times daily., Disp: 100 g, Rfl: 0   enalapril (VASOTEC) 5 MG tablet, Take 1 tablet by mouth daily., Disp: , Rfl:    fluticasone  (FLONASE ) 50 MCG/ACT nasal spray, Place 2 sprays into both nostrils at bedtime., Disp: 48 g, Rfl: 1   ketoconazole  (NIZORAL ) 2 % cream, APPLY 1 APPLICATION TOPICALLY AT BEDTIME., Disp: 60 g, Rfl: 0   loratadine  (CLARITIN ) 10 MG tablet, Take 1 tablet (10 mg total) by mouth daily., Disp: 90 tablet, Rfl: 1   montelukast  (SINGULAIR ) 10 MG tablet, TAKE 1 TABLET BY MOUTH EVERYDAY AT BEDTIME, Disp: 90 tablet, Rfl: 1   polyethylene glycol powder (GLYCOLAX /MIRALAX ) 17 GM/SCOOP powder, Take 17 g by mouth 2 (two) times daily as needed., Disp: 3350 g, Rfl: 1   triamcinolone  cream (KENALOG ) 0.1 %, Apply 1 Application topically 2 (two) times daily., Disp: 453.6 g, Rfl: 0   valACYclovir  (VALTREX ) 500 MG tablet, Take 1 tablet (500 mg total) by mouth daily. And twice daily on outbreaks, Disp: 115 tablet, Rfl: 1   venlafaxine  XR (EFFEXOR  XR) 75 MG 24 hr capsule, Take 1 capsule (75 mg total) by  mouth daily with breakfast., Disp: 90 capsule, Rfl: 1   WEGOVY  0.5 MG/0.5ML SOAJ, Inject 0.5 mg into the skin once a week., Disp: , Rfl:   No Known Allergies  I personally reviewed active problem list, medication list, allergies, family history with the patient/caregiver today.   ROS  Ten systems reviewed and is negative except as mentioned in HPI    Objective  Vitals:   04/13/23 1313  BP: 124/76  Pulse: 94  Resp: 16  Temp: 97.8 F (36.6 C)  TempSrc: Oral  SpO2: 95%  Weight: 235 lb 12.8 oz (107 kg)  Height: 5' 7.5 (1.715 m)    Body mass index is 36.39 kg/m.  Physical Exam  Constitutional: Patient appears well-developed and well-nourished. Obese  No distress.  HEENT: head atraumatic, normocephalic, pupils equal and reactive to light, neck supple Cardiovascular: Normal rate, regular rhythm and normal heart sounds.  No murmur heard. No BLE edema. Pulmonary/Chest: Effort normal and breath sounds normal. No respiratory distress. Abdominal: Soft.  There is no tenderness. Psychiatric: Patient has a normal mood and affect. behavior is normal. Judgment and thought content normal.   No results found for this or any previous visit (from the past 2160 hours).  Diabetic Foot Exam:     PHQ2/9:    04/13/2023    1:11 PM 12/09/2022    2:57 PM 09/14/2022    1:30 PM 06/25/2022   10:38 AM 05/27/2022    3:19 PM  Depression screen PHQ 2/9  Decreased Interest 0 0 0 0 0  Down, Depressed, Hopeless 0 0 0 0 0  PHQ - 2 Score 0 0 0 0 0  Altered sleeping 0 0 0 0 0  Tired, decreased energy 0 1 0 0 0  Change in appetite 0 0 0 0 0  Feeling bad or failure about yourself  0 0 0 0 0  Trouble concentrating 0 0 0 0 0  Moving slowly or fidgety/restless 0 0 0 0 0  Suicidal thoughts 0 0 0 0 0  PHQ-9 Score 0 1 0 0 0  Difficult doing work/chores Not difficult at all Not difficult at all Not difficult at all      phq 9 is negative   Fall Risk:    04/13/2023    1:07 PM 12/09/2022    2:57 PM  09/14/2022    1:30 PM 06/25/2022   10:38 AM 05/27/2022    3:18 PM  Fall Risk   Falls in the past year? 0 0 0 0 0  Number falls in past yr: 0  0 0 0  Injury with Fall? 0  0 0 0  Risk for fall due to : No Fall Risks No Fall Risks  No Fall Risks No Fall Risks  Follow up Falls prevention discussed;Education provided;Falls evaluation completed Falls prevention discussed  Falls prevention discussed Falls prevention discussed    Assessment & Plan     1. Morbid obesity (HCC) (Primary)  We will titrate dose up - Semaglutide -Weight Management 1 MG/0.5ML SOAJ; Inject 1 mg into the skin once a week for 28 days.  Dispense: 2 mL; Refill: 0 - Semaglutide -Weight Management 1.7 MG/0.75ML SOAJ; Inject 1.7 mg into the skin once a week for 28 days.  Dispense: 3 mL; Refill: 0  2. Major depression in remission (HCC)  Continue medications  3. Mild intermittent asthma, uncomplicated  Doing well at this time  4. Hypertension, benign  Controlled, continue medications  5. Metabolic syndrome  Likely will improve with GLP-1 agonist

## 2023-04-26 ENCOUNTER — Other Ambulatory Visit: Payer: Self-pay | Admitting: Family Medicine

## 2023-05-07 ENCOUNTER — Other Ambulatory Visit: Payer: Self-pay | Admitting: Family Medicine

## 2023-05-10 NOTE — Telephone Encounter (Signed)
Requested medications are due for refill today.  unsure  Requested medications are on the active medications list.  yes  Last refill. unsure  Future visit scheduled.   yes  Notes to clinic.  Please review for refill. Unclear if pt is on next higher dose.     Requested Prescriptions  Pending Prescriptions Disp Refills   WEGOVY 0.5 MG/0.5ML SOAJ [Pharmacy Med Name: WEGOVY 0.5 MG/0.5 ML PEN]      Sig: INJECT 0.5 MG INTO THE SKIN ONCE A WEEK FOR 28 DAYS.     Endocrinology:  Diabetes - GLP-1 Receptor Agonists - semaglutide Failed - 05/10/2023  8:40 AM      Failed - HBA1C in normal range and within 180 days    Hgb A1c MFr Bld  Date Value Ref Range Status  05/27/2022 5.5 <5.7 % of total Hgb Final    Comment:    For the purpose of screening for the presence of diabetes: . <5.7%       Consistent with the absence of diabetes 5.7-6.4%    Consistent with increased risk for diabetes             (prediabetes) > or =6.5%  Consistent with diabetes . This assay result is consistent with a decreased risk of diabetes. . Currently, no consensus exists regarding use of hemoglobin A1c for diagnosis of diabetes in children. . According to American Diabetes Association (ADA) guidelines, hemoglobin A1c <7.0% represents optimal control in non-pregnant diabetic patients. Different metrics may apply to specific patient populations.  Standards of Medical Care in Diabetes(ADA). .          Failed - Cr in normal range and within 360 days    Creat  Date Value Ref Range Status  05/27/2022 1.04 (H) 0.50 - 0.96 mg/dL Final   Creatinine, Urine  Date Value Ref Range Status  10/19/2017 216 20 - 275 mg/dL Final         Passed - Valid encounter within last 6 months    Recent Outpatient Visits           3 weeks ago Morbid obesity [facility])   [place] [facility] Alba Cory, MD   5 months ago Morbid obesity [facility])   [place] [facility] Alba Cory, MD   7  months ago Morbid obesity [facility])    [facility] Alba Cory, MD   10 months ago Well adult exam   [facility] Alba Cory, MD   11 months ago Hypertension, benign   [facility] Alba Cory, MD       Future Appointments             In 1 month Carlynn Purl, Danna Hefty, MD [facility]   In 2 months Alba Cory, MD [facility]

## 2023-06-10 ENCOUNTER — Other Ambulatory Visit: Payer: Self-pay | Admitting: Family Medicine

## 2023-06-10 DIAGNOSIS — Z3041 Encounter for surveillance of contraceptive pills: Secondary | ICD-10-CM

## 2023-06-11 NOTE — Telephone Encounter (Signed)
 Last f/u 04/2023

## 2023-06-29 ENCOUNTER — Encounter: Payer: Self-pay | Admitting: Family Medicine

## 2023-06-29 ENCOUNTER — Ambulatory Visit: Payer: Medicaid Other | Admitting: Family Medicine

## 2023-06-29 VITALS — BP 118/76 | HR 91 | Resp 16 | Ht 67.5 in | Wt 231.7 lb

## 2023-06-29 DIAGNOSIS — J302 Other seasonal allergic rhinitis: Secondary | ICD-10-CM

## 2023-06-29 DIAGNOSIS — J4520 Mild intermittent asthma, uncomplicated: Secondary | ICD-10-CM

## 2023-06-29 DIAGNOSIS — K5909 Other constipation: Secondary | ICD-10-CM | POA: Diagnosis not present

## 2023-06-29 DIAGNOSIS — F325 Major depressive disorder, single episode, in full remission: Secondary | ICD-10-CM

## 2023-06-29 DIAGNOSIS — I10 Essential (primary) hypertension: Secondary | ICD-10-CM | POA: Diagnosis not present

## 2023-06-29 DIAGNOSIS — L2082 Flexural eczema: Secondary | ICD-10-CM | POA: Diagnosis not present

## 2023-06-29 DIAGNOSIS — N181 Chronic kidney disease, stage 1: Secondary | ICD-10-CM | POA: Diagnosis not present

## 2023-06-29 DIAGNOSIS — Z3041 Encounter for surveillance of contraceptive pills: Secondary | ICD-10-CM

## 2023-06-29 DIAGNOSIS — L308 Other specified dermatitis: Secondary | ICD-10-CM | POA: Diagnosis not present

## 2023-06-29 DIAGNOSIS — J3089 Other allergic rhinitis: Secondary | ICD-10-CM | POA: Diagnosis not present

## 2023-06-29 DIAGNOSIS — N2581 Secondary hyperparathyroidism of renal origin: Secondary | ICD-10-CM | POA: Diagnosis not present

## 2023-06-29 MED ORDER — FLUTICASONE PROPIONATE 50 MCG/ACT NA SUSP: 2.0000 | Freq: Every day | NASAL | 1 refills | Status: AC

## 2023-06-29 MED ORDER — DESOXIMETASONE 0.25 % EX CREA: 1.0000 | TOPICAL_CREAM | Freq: Two times a day (BID) | CUTANEOUS | 0 refills | Status: DC

## 2023-06-29 MED ORDER — VENLAFAXINE HCL ER 75 MG PO CP24: 75.0000 mg | ORAL_CAPSULE | Freq: Every day | ORAL | 1 refills | Status: DC

## 2023-06-29 MED ORDER — LORATADINE 10 MG PO TABS: 10.0000 mg | ORAL_TABLET | Freq: Every day | ORAL | 1 refills | Status: DC

## 2023-06-29 MED ORDER — APRI 0.15-30 MG-MCG PO TABS: 1.0000 | ORAL_TABLET | Freq: Every day | ORAL | 3 refills | Status: AC

## 2023-06-29 MED ORDER — POLYETHYLENE GLYCOL 3350 17 GM/SCOOP PO POWD: 17.0000 g | Freq: Two times a day (BID) | ORAL | 1 refills | Status: AC | PRN

## 2023-06-29 MED ORDER — TRIAMCINOLONE ACETONIDE 0.1 % EX CREA: 1.0000 | TOPICAL_CREAM | Freq: Two times a day (BID) | CUTANEOUS | 0 refills | Status: DC

## 2023-06-29 MED ORDER — ALBUTEROL SULFATE HFA 108 (90 BASE) MCG/ACT IN AERS: 2.0000 | INHALATION_SPRAY | Freq: Four times a day (QID) | RESPIRATORY_TRACT | 0 refills | Status: AC | PRN

## 2023-06-29 MED ORDER — MONTELUKAST SODIUM 10 MG PO TABS: ORAL_TABLET | ORAL | 1 refills | Status: DC

## 2023-06-29 MED ORDER — ATENOLOL 25 MG PO TABS
25.0000 mg | ORAL_TABLET | Freq: Every day | ORAL | 1 refills | Status: DC
Start: 2023-06-29 — End: 2024-01-17

## 2023-06-29 NOTE — Progress Notes (Signed)
 Name: Rachel Dunlap   MRN: [id number]    DOB: 12-08-1995   Date:06/29/2023       Progress Note  Subjective  Chief Complaint  Chief Complaint  Patient presents with   Medical Management of Chronic Issues   HPI   Morbid Obesity: BMI above 35 with co-morbidities such as HTN, and metabolic syndrome and depression .  She has a long history of obesity ( since childhood) max weight was 265 lbs .She tried multiple diet and also medications. Currently on Wegovy , started January 2025 at weigh tof 236 lbs , she has been compliant, also eating more protein and fruit and vegetables. She has intermittent nausea the two days after the shot, she also burps and it tastes like boiled eggs. Discussed smaller portions, do not drink and eat at the same.   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 Ronn Melena /nephrologist once a year. She is taking vasotec because of microalbuminuria and also Atenolol.  She is aware of teratogenic effects of ACE . BP is well controlled    Pre-diabetes: she denies polyphagia, polydipsia or polyuria. Fasting insulin was elevated, she has been physically active and eating healthy, A1C has normalized, taking GLP-1 agonist to lose weight    AR: doing well at this time, taking medications, no rhinorrhea or congestion. Taking singulair and loratadine. She states symptoms are worse this time of the year   Asthma: she is taking singulair daily, denies wheezing, cough or sob lately.Uses albuterol prn only , usually before activity and needs a refill    Genital herpes: not currently sexually active, and only had one outbreak in her life, she is now on prn medication only and still has medication at home   Constipation: long history of constipation,  she is on prn  miralax and drinking more water , when she takes miralax daily she is able to have bowel movements daily, she states drinking more water has helped   Eczema: uses Topicort prn , she uses medication for flares intermittently .Currently doing well      Patient Active Problem List   Diagnosis Date Noted   B12 deficiency 08/13/2021   Obesity (BMI 30.0-34.9) 08/13/2021   Major depression in remission (HCC) 08/13/2021   Perennial allergic rhinitis with seasonal variation 08/13/2021   Eczema 08/31/2018   Insulin resistance 05/16/2018   Prediabetes 02/14/2018   Essential hypertension 02/14/2018   Back pain due to injury 09/18/2016   Acne vulgaris 09/16/2015   Hypertrichosis 09/16/2015   Chronic constipation 11/09/2014   Chronic dermatitis 11/09/2014   Metabolic syndrome 11/09/2014   Hypertension, benign 11/09/2014   Genital herpes in women 11/09/2014   Mild intermittent asthma, uncomplicated 11/09/2014   Allergic rhinitis, seasonal 11/09/2014   IBS (irritable bowel syndrome) 11/09/2014   Bilateral polycystic ovarian syndrome 11/09/2014   Abnormal presence of protein in urine 11/09/2014   Vitamin D deficiency 11/09/2014   Anxiety 11/09/2014    History reviewed. No pertinent surgical history.  Family History  Problem Relation Age of Onset   Hypertension Mother    Obesity Mother    Cancer Father    Diabetes Father    Kidney disease Father    Obesity Maternal Grandmother     Social History   Tobacco Use  Smoking status: Never   Smokeless tobacco: Never  Substance Use Topics   Alcohol use: Yes    Alcohol/week: 0.0 standard drinks of alcohol    Comment: seldom     Current Outpatient Medications:    albuterol (VENTOLIN HFA) 108 (90 Base) MCG/ACT inhaler, Inhale 2 puffs into the lungs every 6 (six) hours as needed for wheezing or shortness of breath., Disp: 6.7 each, Rfl: 0   atenolol (TENORMIN) 25 MG tablet, Take 1 tablet (25 mg total) by mouth daily., Disp: 90 tablet,  Rfl: 1   Blood Pressure Monitoring (OMRON 5 SERIES BP MONITOR) DEVI, , Disp: , Rfl:    desogestrel-ethinyl estradiol (APRI) 0.15-30 MG-MCG tablet, TAKE 1 TABLET BY MOUTH EVERY DAY, Disp: 84 tablet, Rfl: 0   desoximetasone (TOPICORT) 0.25 % cream, Apply 1 Application topically 2 (two) times daily., Disp: 100 g, Rfl: 0   enalapril (VASOTEC) 5 MG tablet, Take 1 tablet by mouth daily., Disp: , Rfl:    fluticasone (FLONASE) 50 MCG/ACT nasal spray, Place 2 sprays into both nostrils at bedtime., Disp: 48 g, Rfl: 1   ketoconazole (NIZORAL) 2 % cream, APPLY 1 APPLICATION TOPICALLY AT BEDTIME., Disp: 60 g, Rfl: 0   loratadine (CLARITIN) 10 MG tablet, Take 1 tablet (10 mg total) by mouth daily., Disp: 90 tablet, Rfl: 1   montelukast (SINGULAIR) 10 MG tablet, TAKE 1 TABLET BY MOUTH EVERYDAY AT BEDTIME, Disp: 90 tablet, Rfl: 1   polyethylene glycol powder (GLYCOLAX/MIRALAX) 17 GM/SCOOP powder, Take 17 g by mouth 2 (two) times daily as needed., Disp: 3350 g, Rfl: 1   Semaglutide-Weight Management (WEGOVY) 0.5 MG/0.5ML SOAJ, INJECT 0.5 MG INTO THE SKIN ONCE A WEEK FOR 28 DAYS., Disp: 2 mL, Rfl: 0   Semaglutide-Weight Management 1 MG/0.5ML SOAJ, Inject 1 mg into the skin once a week for 28 days., Disp: 2 mL, Rfl: 0   triamcinolone cream (KENALOG) 0.1 %, Apply 1 Application topically 2 (two) times daily., Disp: 453.6 g, Rfl: 0   valACYclovir (VALTREX) 500 MG tablet, Take 1 tablet (500 mg total) by mouth daily. And twice daily on outbreaks, Disp: 115 tablet, Rfl: 1   venlafaxine XR (EFFEXOR XR) 75 MG 24 hr capsule, Take 1 capsule (75 mg total) by mouth daily with breakfast., Disp: 90 capsule, Rfl: 1   [START ON 07/09/2023] Semaglutide-Weight Management 1.7 MG/0.75ML SOAJ, Inject 1.7 mg into the skin once a week for 28 days. (Patient not taking: Reported on 06/29/2023), Disp: 3 mL, Rfl: 0  No Known Allergies  I personally reviewed active problem list, medication list, allergies, family history with the  patient/caregiver today.   ROS  Ten systems reviewed and is negative except as mentioned in HPI   Objective  Vitals:   06/29/23 1329  BP: 118/76  Pulse: 91  Resp: 16  SpO2: 96%  Weight: 231 lb 11.2 oz (105.1 kg)  Height: 5' 7.5" (1.715 m)    Body mass index is 35.75 kg/m.  Physical Exam  Constitutional: Patient appears well-developed and well-nourished. Obese  No distress.  HEENT: head atraumatic, normocephalic, pupils equal and reactive to light, neck supple Cardiovascular: Normal rate, regular rhythm and normal heart sounds.  No murmur heard. No BLE edema. Pulmonary/Chest: Effort normal and breath sounds normal. No respiratory distress. Abdominal: Soft.  There is no tenderness. Psychiatric: Patient has a normal mood and affect. behavior is normal. Judgment and thought content normal.   Diabetic Foot Exam:     PHQ2/9:    06/29/2023  1:29 PM 04/13/2023    1:11 PM 12/09/2022    2:57 PM 09/14/2022    1:30 PM 06/25/2022   10:38 AM  Depression screen PHQ 2/9  Decreased Interest 0 0 0 0 0  Down, Depressed, Hopeless 0 0 0 0 0  PHQ - 2 Score 0 0 0 0 0  Altered sleeping 0 0 0 0 0  Tired, decreased energy 0 0 1 0 0  Change in appetite 0 0 0 0 0  Feeling bad or failure about yourself  0 0 0 0 0  Trouble concentrating 0 0 0 0 0  Moving slowly or fidgety/restless 0 0 0 0 0  Suicidal thoughts 0 0 0 0 0  PHQ-9 Score 0 0 1 0 0  Difficult doing work/chores Not difficult at all Not difficult at all Not difficult at all Not difficult at all     phq 9 is negative  Fall Risk:    04/13/2023    1:07 PM 12/09/2022    2:57 PM 09/14/2022    1:30 PM 06/25/2022   10:38 AM 05/27/2022    3:18 PM  Fall Risk   Falls in the past year? 0 0 0 0 0  Number falls in past yr: 0  0 0 0  Injury with Fall? 0  0 0 0  Risk for fall due to : No Fall Risks No Fall Risks  No Fall Risks No Fall Risks  Follow up Falls prevention discussed;Education provided;Falls evaluation completed Falls prevention  discussed  Falls prevention discussed Falls prevention discussed     Assessment & Plan  1. Hypertension, benign  - atenolol (TENORMIN) 25 MG tablet; Take 1 tablet (25 mg total) by mouth daily.  Dispense: 90 tablet; Refill: 1  2. Major depression in remission (HCC)  - venlafaxine XR (EFFEXOR XR) 75 MG 24 hr capsule; Take 1 capsule (75 mg total) by mouth daily with breakfast.  Dispense: 90 capsule; Refill: 1  3. Secondary hyperparathyroidism of renal origin (HCC)  Monitored by Dr. Owens Shark  4. Perennial allergic rhinitis with seasonal variation  - loratadine (CLARITIN) 10 MG tablet; Take 1 tablet (10 mg total) by mouth daily.  Dispense: 90 tablet; Refill: 1 - fluticasone (FLONASE) 50 MCG/ACT nasal spray; Place 2 sprays into both nostrils at bedtime.  Dispense: 48 g; Refill: 1  5. Chronic constipation  - polyethylene glycol powder (GLYCOLAX/MIRALAX) 17 GM/SCOOP powder; Take 17 g by mouth 2 (two) times daily as needed.  Dispense: 3350 g; Refill: 1  6. Encounter for surveillance of contraceptive pills  - desogestrel-ethinyl estradiol (APRI) 0.15-30 MG-MCG tablet; Take 1 tablet by mouth daily.  Dispense: 84 tablet; Refill: 3  7. Flexural eczema  - desoximetasone (TOPICORT) 0.25 % cream; Apply 1 Application topically 2 (two) times daily.  Dispense: 100 g; Refill: 0  8. Mild intermittent asthma, uncomplicated  - montelukast (SINGULAIR) 10 MG tablet; TAKE 1 TABLET BY MOUTH EVERYDAY AT BEDTIME  Dispense: 90 tablet; Refill: 1 - albuterol (VENTOLIN HFA) 108 (90 Base) MCG/ACT inhaler; Inhale 2 puffs into the lungs every 6 (six) hours as needed for wheezing or shortness of breath.  Dispense: 6.7 each; Refill: 0  9. Other eczema  - triamcinolone cream (KENALOG) 0.1 %; Apply 1 Application topically 2 (two) times daily.  Dispense: 453.6 g; Refill: 0  10. Chronic kidney disease, stage 1 (Primary)

## 2023-07-12 ENCOUNTER — Other Ambulatory Visit: Payer: Self-pay | Admitting: Family Medicine

## 2023-07-16 ENCOUNTER — Telehealth: Payer: Self-pay

## 2023-07-16 NOTE — Telephone Encounter (Signed)
 Prior auth on

## 2023-08-03 ENCOUNTER — Ambulatory Visit (INDEPENDENT_AMBULATORY_CARE_PROVIDER_SITE_OTHER): Payer: Self-pay | Admitting: Family Medicine

## 2023-08-03 ENCOUNTER — Other Ambulatory Visit (HOSPITAL_COMMUNITY)
Admission: RE | Admit: 2023-08-03 | Discharge: 2023-08-03 | Disposition: A | Discharge status: Home / Self Care | Source: Ambulatory Visit | Attending: Family Medicine | Admitting: Family Medicine

## 2023-08-03 ENCOUNTER — Encounter: Payer: Self-pay | Admitting: Family Medicine

## 2023-08-03 VITALS — BP 112/74 | HR 73 | Resp 16 | Ht 67.5 in | Wt 225.0 lb

## 2023-08-03 DIAGNOSIS — Z13220 Encounter for screening for lipoid disorders: Secondary | ICD-10-CM

## 2023-08-03 DIAGNOSIS — Z0000 Encounter for general adult medical examination without abnormal findings: Secondary | ICD-10-CM | POA: Insufficient documentation

## 2023-08-03 DIAGNOSIS — Z79899 Other long term (current) drug therapy: Secondary | ICD-10-CM | POA: Diagnosis not present

## 2023-08-03 DIAGNOSIS — Z113 Encounter for screening for infections with a predominantly sexual mode of transmission: Secondary | ICD-10-CM | POA: Diagnosis not present

## 2023-08-03 DIAGNOSIS — E66811 Obesity, class 1: Secondary | ICD-10-CM | POA: Diagnosis not present

## 2023-08-03 DIAGNOSIS — E8881 Metabolic syndrome: Secondary | ICD-10-CM | POA: Diagnosis not present

## 2023-08-03 NOTE — Progress Notes (Signed)
 Name: Rachel Dunlap   MRN: [id number]    DOB: 01-15-1996   Date:08/03/2023       Progress Note  Subjective  Chief Complaint  Chief Complaint  Patient presents with   Annual Exam    HPI  Patient presents for annual CPE.  Obesity: she was given Wegovy  in Dec but only able to fill it January 2025 weight was 235 lbs she was taking it weekly and go up to a 1.7 mg dose but unable to fill last rx. She states mediations helps curb her appetite she is also following a healthy diet and has been physically active, going to the gym twice a week, weight is down today to 225 lbs. We will try getting another PA to get rx filled - due to not losing 5 % of her weight. Based on weight of 235 lbs she is almost at the 12 lbs mark of 5 % of weight loss. She will return to get a weight check in 1-2 weeks and if she gets to a weight of 224 Lbs. We will need to resume at 1 mg dose and titrate up as tolerated   Diet: balanced diet  Exercise: continue regular physical activity   Last Eye Exam: completed Last Dental Exam:  she is calling to schedule   Flowsheet Row Office Visit from 04/13/2023 in [facility]  AUDIT-C Score 2       Depression: Phq 9 is  negative    06/29/2023    1:29 PM 04/13/2023    1:11 PM 12/09/2022    2:57 PM 09/14/2022    1:30 PM 06/25/2022   10:38 AM  Depression screen PHQ 2/9  Decreased Interest 0 0 0 0 0  Down, Depressed, Hopeless 0 0 0 0 0  PHQ - 2 Score 0 0 0 0 0  Altered sleeping 0 0 0 0 0  Tired, decreased energy 0 0 1 0 0  Change in appetite 0 0 0 0 0  Feeling bad or failure about yourself  0 0 0 0 0  Trouble concentrating 0 0 0 0 0  Moving slowly or fidgety/restless 0 0 0 0 0  Suicidal thoughts 0 0 0 0 0  PHQ-9 Score 0 0 1 0 0  Difficult doing work/chores Not difficult at all Not difficult at all Not difficult at all Not difficult at all    Hypertension: BP Readings from Last 3 Encounters:  08/03/23 112/74  06/29/23 118/76  04/13/23 124/76    Obesity: Wt Readings from Last 3 Encounters:  08/03/23 225 lb (102.1 kg)  06/29/23 231 lb 11.2 oz (105.1 kg)  04/13/23 235 lb 12.8 oz (107 kg)   BMI Readings from Last 3 Encounters:  08/03/23 34.72 kg/m  06/29/23 35.75 kg/m  04/13/23 36.39 kg/m     Vaccines: reviewed with the patient.   Hep C Screening: completed STD testing and prevention (HIV/chl/gon/syphilis):  Intimate partner violence: negative screen  Sexual History : not currently  Menstrual History/LMP/Abnormal Bleeding: regular cycle, started LMP, yesterday, mild cramps, light cycles Discussed importance of follow up if any post-menopausal bleeding: not applicable  Incontinence Symptoms: negative for symptoms   Breast cancer:  - Last Mammogram: N/A - BRCA gene screening: N/A  Osteoporosis Prevention : Discussed high calcium and vitamin D  supplementation, weight bearing exercises Bone density :not applicable   Cervical cancer screening: up-to-date  Skin cancer: Discussed monitoring for atypical lesions  Colorectal cancer: N/A   Lung cancer:  Low Dose CT  Chest recommended if Age 32-80 years, 20 pack-year currently smoking OR have quit w/in 15years. Patient does not qualify for screen   ECG:2019   Advanced Care Planning: A voluntary discussion about advance care planning including the explanation and discussion of advance directives.  Discussed health care proxy and Living will, and the patient was able to identify a health care proxy as mother .  Patient does not have a living will and power of attorney of health care   Patient Active Problem List   Diagnosis Date Noted   Secondary hyperparathyroidism of renal origin (HCC) 06/29/2023   B12 deficiency 08/13/2021   Obesity (BMI 30.0-34.9) 08/13/2021   Major depression in remission (HCC) 08/13/2021   Perennial allergic rhinitis with seasonal variation 08/13/2021   Eczema 08/31/2018   Insulin  resistance 05/16/2018   Prediabetes 02/14/2018   Essential  hypertension 02/14/2018   Back pain due to injury 09/18/2016   Acne vulgaris 09/16/2015   Hypertrichosis 09/16/2015   Chronic constipation 11/09/2014   Chronic dermatitis 11/09/2014   Metabolic syndrome 11/09/2014   Hypertension, benign 11/09/2014   Genital herpes in women 11/09/2014   Mild intermittent asthma, uncomplicated 11/09/2014   Allergic rhinitis, seasonal 11/09/2014   IBS (irritable bowel syndrome) 11/09/2014   Bilateral polycystic ovarian syndrome 11/09/2014   Abnormal presence of protein in urine 11/09/2014   Vitamin D  deficiency 11/09/2014   Anxiety 11/09/2014    No past surgical history on file.  Family History  Problem Relation Age of Onset   Hypertension Mother    Obesity Mother    Anxiety disorder Mother    Arthritis Mother    Depression Mother    Cancer Father    Diabetes Father    Kidney disease Father    Obesity Maternal Grandmother    Arthritis Maternal Grandmother    Asthma Maternal Grandfather    COPD Maternal Grandfather     Social History   Socioeconomic History   Marital status: Single    Spouse name: Not on file   Number of children: 0   Years of education: Not on file   Highest education level: Bachelor's degree (e.g., BA, AB, BS)  Occupational History   Occupation: gas station attendant.     Comment: [employer]  Tobacco Use   Smoking status: Never   Smokeless tobacco: Never  Vaping Use   Vaping status: Never Used  Substance and Sexual Activity   Alcohol use: Yes    Alcohol/week: 9.0 standard drinks of alcohol    Types: 1 Glasses of wine, 8 Standard drinks or equivalent per week    Comment: Water and soda sometimes   Drug use: No   Sexual activity: Not Currently    Partners: Male    Birth control/protection: Pill  Other Topics Concern   Not on file  Social History Narrative   She wemt to school at [employer] - graduated Dec  2019 - hospitality and Diplomatic Services operational officer - she wants to be chef-goal is to work at a cruise  ship.   She works part time at [employer]: Low Risk  (04/12/2023)   Overall Financial Resource Strain (CARDIA)    Difficulty of Paying Living Expenses: Not hard at all  Food Insecurity: No Food Insecurity (04/12/2023)   Hunger Vital Sign    Worried About Running Out of Food in the Last Year: Never true    Ran Out of Food in the Last Year: Never true  Transportation Needs: No Transportation Needs (04/12/2023)   PRAPARE - Administrator, Civil Service (Medical): No    Lack of Transportation (Non-Medical): No  Physical Activity: Insufficiently Active (04/12/2023)   Exercise Vital Sign    Days of Exercise per Week: 4 days    Minutes of Exercise per Session: 30 min  Stress: No Stress Concern Present (04/12/2023)   [employer] of Occupational Health - Occupational Stress Questionnaire    Feeling of Stress : Not at all  Social Connections: Moderately Integrated (04/12/2023)   Social Connection and Isolation Panel [NHANES]    Frequency of Communication with Friends and Family: Twice a week    Frequency of Social Gatherings with Friends and Family: Once a week    Attends Religious Services: 1 to 4 times per year    Active Member of [employer] or Organizations: Yes    Attends Banker Meetings: 1 to 4 times per year    Marital Status: Never married  Intimate Partner Violence: Not At Risk (08/03/2023)   Humiliation, Afraid, Rape, and Kick questionnaire    Fear of Current or Ex-Partner: No    Emotionally Abused: No    Physically Abused: No    Sexually Abused: No     Current Outpatient Medications:    albuterol  (VENTOLIN  HFA) 108 (90 Base) MCG/ACT inhaler, Inhale 2 puffs into the lungs every 6 (six) hours as needed for wheezing or shortness of breath., Disp: 6.7 each, Rfl: 0   atenolol  (TENORMIN ) 25 MG tablet, Take 1 tablet (25 mg total) by mouth daily., Disp: 90 tablet, Rfl: 1   Blood Pressure Monitoring (OMRON 5 SERIES BP  MONITOR) DEVI, , Disp: , Rfl:    desogestrel -ethinyl estradiol  (APRI ) 0.15-30 MG-MCG tablet, Take 1 tablet by mouth daily., Disp: 84 tablet, Rfl: 3   desoximetasone  (TOPICORT ) 0.25 % cream, Apply 1 Application topically 2 (two) times daily., Disp: 100 g, Rfl: 0   enalapril (VASOTEC) 5 MG tablet, Take 1 tablet by mouth daily., Disp: , Rfl:    fluticasone  (FLONASE ) 50 MCG/ACT nasal spray, Place 2 sprays into both nostrils at bedtime., Disp: 48 g, Rfl: 1   ketoconazole  (NIZORAL ) 2 % cream, APPLY 1 APPLICATION TOPICALLY AT BEDTIME., Disp: 60 g, Rfl: 0   loratadine  (CLARITIN ) 10 MG tablet, Take 1 tablet (10 mg total) by mouth daily., Disp: 90 tablet, Rfl: 1   montelukast  (SINGULAIR ) 10 MG tablet, TAKE 1 TABLET BY MOUTH EVERYDAY AT BEDTIME, Disp: 90 tablet, Rfl: 1   polyethylene glycol powder (GLYCOLAX /MIRALAX ) 17 GM/SCOOP powder, Take 17 g by mouth 2 (two) times daily as needed., Disp: 3350 g, Rfl: 1   triamcinolone  cream (KENALOG ) 0.1 %, Apply 1 Application topically 2 (two) times daily., Disp: 453.6 g, Rfl: 0   valACYclovir  (VALTREX ) 500 MG tablet, Take 1 tablet (500 mg total) by mouth daily. And twice daily on outbreaks, Disp: 115 tablet, Rfl: 1   venlafaxine  XR (EFFEXOR  XR) 75 MG 24 hr capsule, Take 1 capsule (75 mg total) by mouth daily with breakfast., Disp: 90 capsule, Rfl: 1   Semaglutide -Weight Management 1.7 MG/0.75ML SOAJ, Inject 1.7 mg into the skin once a week for 28 days. (Patient not taking: Reported on 08/03/2023), Disp: 3 mL, Rfl: 0  No Known Allergies   ROS  Constitutional: Negative for fever , positive for  weight change.  Respiratory: Negative for cough and shortness of breath.   Cardiovascular: Negative for chest pain or palpitations.  Gastrointestinal: Negative for abdominal pain, no bowel  changes.  Musculoskeletal: Negative for gait problem or joint swelling.  Skin: Negative for rash.  Neurological: Negative for dizziness or headache.  No other specific complaints in a  complete review of systems (except as listed in HPI above).   Objective  Vitals:   08/03/23 1003  BP: 112/74  Pulse: 73  Resp: 16  SpO2: 100%  Weight: 225 lb (102.1 kg)  Height: 5' 7.5" (1.715 m)    Body mass index is 34.72 kg/m.  Physical Exam  Constitutional: Patient appears well-developed and well-nourished. No distress.  HENT: Head: Normocephalic and atraumatic. Ears: B TMs ok, no erythema or effusion; Nose: Nose normal. Mouth/Throat: Oropharynx is clear and moist. No oropharyngeal exudate.  Eyes: Conjunctivae and EOM are normal. Pupils are equal, round, and reactive to light. No scleral icterus.  Neck: Normal range of motion. Neck supple. No JVD present. No thyromegaly present.  Cardiovascular: Normal rate, regular rhythm and normal heart sounds.  No murmur heard. No BLE edema. Pulmonary/Chest: Effort normal and breath sounds normal. No respiratory distress. Abdominal: Soft. Bowel sounds are normal, no distension. There is no tenderness. no masses Breast: no lumps or masses, no nipple discharge or rashes FEMALE GENITALIA:  Not done  RECTAL: not done  Musculoskeletal: Normal range of motion, no joint effusions. No gross deformities Neurological: he is alert and oriented to person, place, and time. No cranial nerve deficit. Coordination, balance, strength, speech and gait are normal.  Skin: Skin is warm and dry. No rash noted. No erythema.  Psychiatric: Patient has a normal mood and affect. behavior is normal. Judgment and thought content normal.     Assessment & Plan  1. Well adult exam (Primary)  - Cervicovaginal ancillary only - Lipid panel - CBC with Differential/Platelet - Comprehensive metabolic panel with GFR - Hemoglobin A1c - RPR - HIV Antibody (routine testing w rflx)  2. Obesity (BMI 30.0-34.9)  Discussed with the patient the risk posed by an increased BMI. Discussed importance of portion control, calorie counting and at least 150 minutes of physical  activity weekly. Avoid sweet beverages and drink more water. Eat at least 6 servings of fruit and vegetables daily    3. Routine screening for STI (sexually transmitted infection)  - RPR - HIV Antibody (routine testing w rflx)  4. Metabolic syndrome  - Hemoglobin A1c  5. Long-term use of high-risk medication  - CBC with Differential/Platelet - Comprehensive metabolic panel with GFR  6. Lipid screening  - Lipid panel   -USPSTF grade A and B recommendations reviewed with patient; age-appropriate recommendations, preventive care, screening tests, etc discussed and encouraged; healthy living encouraged; see AVS for patient education given to patient -Discussed importance of 150 minutes of physical activity weekly, eat two servings of fish weekly, eat one serving of tree nuts ( cashews, pistachios, pecans, almonds.Aaron Aas) every other day, eat 6 servings of fruit/vegetables daily and drink plenty of water and avoid sweet beverages.   -Reviewed Health Maintenance: Yes.

## 2023-08-04 LAB — COMPREHENSIVE METABOLIC PANEL WITH GFR
AG Ratio: 1.4 (calc) (ref 1.0–2.5)
ALT: 9 U/L (ref 6–29)
AST: 14 U/L (ref 10–30)
Albumin: 4 g/dL (ref 3.6–5.1)
Alkaline phosphatase (APISO): 45 U/L (ref 31–125)
BUN: 8 mg/dL (ref 7–25)
CO2: 26 mmol/L (ref 20–32)
Calcium: 9.2 mg/dL (ref 8.6–10.2)
Chloride: 108 mmol/L (ref 98–110)
Creat: 0.91 mg/dL (ref 0.50–0.96)
Globulin: 2.9 g/dL (ref 1.9–3.7)
Glucose, Bld: 80 mg/dL (ref 65–99)
Potassium: 4.3 mmol/L (ref 3.5–5.3)
Sodium: 139 mmol/L (ref 135–146)
Total Bilirubin: 0.4 mg/dL (ref 0.2–1.2)
Total Protein: 6.9 g/dL (ref 6.1–8.1)
eGFR: 88 mL/min/{1.73_m2} (ref >=60)

## 2023-08-04 LAB — CERVICOVAGINAL ANCILLARY ONLY
Chlamydia: NEGATIVE
Comment: NEGATIVE
Comment: NEGATIVE
Comment: NORMAL
Neisseria Gonorrhea: NEGATIVE
Trichomonas: NEGATIVE

## 2023-08-04 LAB — HEMOGLOBIN A1C
Hgb A1c MFr Bld: 5.4 % (ref <5.7)
Mean Plasma Glucose: 108 mg/dL
eAG (mmol/L): 6 mmol/L

## 2023-08-04 LAB — CBC WITH DIFFERENTIAL/PLATELET
Absolute Lymphocytes: 1794 {cells}/uL (ref 850–3900)
Absolute Monocytes: 628 {cells}/uL (ref 200–950)
Basophils Absolute: 41 {cells}/uL (ref 0–200)
Basophils Relative: 0.6 %
Eosinophils Absolute: 407 {cells}/uL (ref 15–500)
Eosinophils Relative: 5.9 %
HCT: 37.7 % (ref 35.0–45.0)
Hemoglobin: 12.4 g/dL (ref 11.7–15.5)
MCH: 27.9 pg (ref 27.0–33.0)
MCHC: 32.9 g/dL (ref 32.0–36.0)
MCV: 84.7 fL (ref 80.0–100.0)
MPV: 11.6 fL (ref 7.5–12.5)
Monocytes Relative: 9.1 %
Neutro Abs: 4030 {cells}/uL (ref 1500–7800)
Neutrophils Relative %: 58.4 %
Platelets: 210 10*3/uL (ref 140–400)
RBC: 4.45 10*6/uL (ref 3.80–5.10)
RDW: 12.6 % (ref 11.0–15.0)
Total Lymphocyte: 26 %
WBC: 6.9 10*3/uL (ref 3.8–10.8)

## 2023-08-04 LAB — LIPID PANEL
Cholesterol: 202 mg/dL — ABNORMAL HIGH (ref <200)
HDL: 60 mg/dL (ref >=50)
LDL Cholesterol (Calc): 124 mg/dL — ABNORMAL HIGH
Non-HDL Cholesterol (Calc): 142 mg/dL — ABNORMAL HIGH (ref <130)
Total CHOL/HDL Ratio: 3.4 (calc) (ref <5.0)
Triglycerides: 85 mg/dL (ref <150)

## 2023-08-04 LAB — RPR: RPR Ser Ql: NONREACTIVE

## 2023-08-04 LAB — HIV ANTIBODY (ROUTINE TESTING W REFLEX): HIV 1&2 Ab, 4th Generation: NONREACTIVE

## 2023-08-06 ENCOUNTER — Encounter: Payer: Self-pay | Admitting: Family Medicine

## 2023-08-27 ENCOUNTER — Ambulatory Visit: Admitting: Family Medicine

## 2023-08-27 ENCOUNTER — Encounter: Payer: Self-pay | Admitting: Family Medicine

## 2023-08-27 VITALS — BP 138/84 | HR 97 | Resp 16 | Ht 67.5 in | Wt 219.4 lb

## 2023-08-27 DIAGNOSIS — J36 Peritonsillar abscess: Secondary | ICD-10-CM

## 2023-08-27 DIAGNOSIS — J029 Acute pharyngitis, unspecified: Secondary | ICD-10-CM

## 2023-08-27 LAB — POCT RAPID STREP A (OFFICE): Rapid Strep A Screen: POSITIVE — AB

## 2023-08-27 MED ORDER — AMOXICILLIN-POT CLAVULANATE 875-125 MG PO TABS: 1.0000 | ORAL_TABLET | Freq: Two times a day (BID) | ORAL | 0 refills | Status: DC

## 2023-08-27 NOTE — Progress Notes (Signed)
 Name: Rachel Dunlap   MRN: [id number]    DOB: 1996/02/26   Date:08/27/2023       Progress Note  Subjective  Chief Complaint  Chief Complaint  Patient presents with   Sore Throat    Since wednesday   Ear Pain    L ear since wednesday    Discussed the use of AI scribe software for clinical note transcription with the patient, who gave verbal consent to proceed.  History of Present Illness Rachel Dunlap is a 28 year old female who presents with left ear pain and sore throat.  Her symptoms began two days ago with left ear pain, initially considered minor. After returning home from school, she experienced a headache and took a nap. Upon waking, the ear pain had worsened significantly.  She has been using home remedies including peroxide, ear drops, and vapor rub on a cotton ball in her ear. Additionally, she has been taking Nyquil to manage her symptoms.  She mentions experiencing a slight sore throat, but it is not severe. No fever, but she reports mild chills, lack of appetite, and slight discomfort when swallowing.    Patient Active Problem List   Diagnosis Date Noted   Secondary hyperparathyroidism of renal origin (HCC) 06/29/2023   B12 deficiency 08/13/2021   Obesity (BMI 30.0-34.9) 08/13/2021   Major depression in remission (HCC) 08/13/2021   Perennial allergic rhinitis with seasonal variation 08/13/2021   Eczema 08/31/2018   Insulin  resistance 05/16/2018   Prediabetes 02/14/2018   Essential hypertension 02/14/2018   Back pain due to injury 09/18/2016   Acne vulgaris 09/16/2015   Hypertrichosis 09/16/2015   Chronic constipation 11/09/2014   Chronic dermatitis 11/09/2014   Metabolic syndrome 11/09/2014   Hypertension, benign 11/09/2014   Genital herpes in women 11/09/2014   Mild intermittent asthma, uncomplicated 11/09/2014   Allergic rhinitis, seasonal 11/09/2014   IBS (irritable bowel syndrome) 11/09/2014   Bilateral polycystic ovarian syndrome 11/09/2014   Abnormal  presence of protein in urine 11/09/2014   Vitamin D  deficiency 11/09/2014   Anxiety 11/09/2014    Social History   Tobacco Use   Smoking status: Never   Smokeless tobacco: Never  Substance Use Topics   Alcohol use: Yes    Alcohol/week: 9.0 standard drinks of alcohol    Types: 1 Glasses of wine, 8 Standard drinks or equivalent per week    Comment: Water and soda sometimes     Current Outpatient Medications:    albuterol  (VENTOLIN  HFA) 108 (90 Base) MCG/ACT inhaler, Inhale 2 puffs into the lungs every 6 (six) hours as needed for wheezing or shortness of breath., Disp: 6.7 each, Rfl: 0   atenolol  (TENORMIN ) 25 MG tablet, Take 1 tablet (25 mg total) by mouth daily., Disp: 90 tablet, Rfl: 1   Blood Pressure Monitoring (OMRON 5 SERIES BP MONITOR) DEVI, , Disp: , Rfl:    desogestrel -ethinyl estradiol  (APRI ) 0.15-30 MG-MCG tablet, Take 1 tablet by mouth daily., Disp: 84 tablet, Rfl: 3   desoximetasone  (TOPICORT ) 0.25 % cream, Apply 1 Application topically 2 (two) times daily., Disp: 100 g, Rfl: 0   enalapril (VASOTEC) 5 MG tablet, Take 1 tablet by mouth daily., Disp: , Rfl:    fluticasone  (FLONASE ) 50 MCG/ACT nasal spray, Place 2 sprays into both nostrils at bedtime., Disp: 48 g, Rfl: 1   ketoconazole  (NIZORAL ) 2 % cream, APPLY 1 APPLICATION TOPICALLY AT BEDTIME., Disp: 60 g, Rfl: 0   loratadine  (CLARITIN ) 10 MG tablet, Take 1 tablet (10 mg total) by mouth  daily., Disp: 90 tablet, Rfl: 1   montelukast  (SINGULAIR ) 10 MG tablet, TAKE 1 TABLET BY MOUTH EVERYDAY AT BEDTIME, Disp: 90 tablet, Rfl: 1   polyethylene glycol powder (GLYCOLAX /MIRALAX ) 17 GM/SCOOP powder, Take 17 g by mouth 2 (two) times daily as needed., Disp: 3350 g, Rfl: 1   triamcinolone  cream (KENALOG ) 0.1 %, Apply 1 Application topically 2 (two) times daily., Disp: 453.6 g, Rfl: 0   valACYclovir  (VALTREX ) 500 MG tablet, Take 1 tablet (500 mg total) by mouth daily. And twice daily on outbreaks, Disp: 115 tablet, Rfl: 1   venlafaxine   XR (EFFEXOR  XR) 75 MG 24 hr capsule, Take 1 capsule (75 mg total) by mouth daily with breakfast., Disp: 90 capsule, Rfl: 1   Vitamin D , Ergocalciferol , (DRISDOL ) 1.25 MG (50000 UNIT) CAPS capsule, Take 50,000 Units by mouth every 30 (thirty) days., Disp: , Rfl:   No Known Allergies  ROS  Ten systems reviewed and is negative except as mentioned in HPI    Objective  Vitals:   08/27/23 1426  BP: 138/84  Pulse: 97  Resp: 16  SpO2: 100%  Weight: 219 lb 6.4 oz (99.5 kg)  Height: 5' 7.5" (1.715 m)    Body mass index is 33.86 kg/m.   Physical Exam CONSTITUTIONAL: Patient appears well-developed and well-nourished. No acute distress. HEENT: Head atraumatic, normocephalic. Throat red with left tonsil deviated medially, no exudate. Left ear pain, no discharge, normal TM. Neck supple, non-tender. Mild tenderness during palpation of submandibularly area on left  CARDIOVASCULAR: Normal rate, regular rhythm and normal heart sounds. No murmur heard. No BLE edema. PULMONARY: Effort normal and breath sounds normal. No respiratory distress. PSYCHIATRIC: Patient has a normal mood and affect. Behavior is normal. Judgment and thought content normal.  Recent Results (from the past 2160 hours)  Cervicovaginal ancillary only     Status: None   Collection Time: 08/03/23 10:41 AM  Result Value Ref Range   Neisseria Gonorrhea Negative    Chlamydia Negative    Trichomonas Negative    Comment Normal Reference Range Trichomonas - Negative    Comment Normal Reference Ranger Chlamydia - Negative    Comment      Normal Reference Range Neisseria Gonorrhea - Negative  Lipid panel     Status: Abnormal   Collection Time: 08/03/23 10:51 AM  Result Value Ref Range   Cholesterol 202 (H) <200 mg/dL   HDL 60 > OR = 50 mg/dL   Triglycerides 85 <161 mg/dL   LDL Cholesterol (Calc) 124 (H) mg/dL (calc)    Comment: Reference range: <100 . Desirable range <100 mg/dL for primary prevention;   <70 mg/dL for  patients with CHD or diabetic patients  with > or = 2 CHD risk factors. Aaron Aas LDL-C is now calculated using the Martin-Hopkins  calculation, which is a validated novel method providing  better accuracy than the Friedewald equation in the  estimation of LDL-C.  Melinda Sprawls et al. Erroll Heard. 0960;454(09): 2061-2068  (http://education.[facility].com/faq/FAQ164)    Total CHOL/HDL Ratio 3.4 <5.0 (calc)   Non-HDL Cholesterol (Calc) 142 (H) <130 mg/dL (calc)    Comment: For patients with diabetes plus 1 major ASCVD risk  factor, treating to a non-HDL-C goal of <100 mg/dL  (LDL-C of <81 mg/dL) is considered a therapeutic  option.   CBC with Differential/Platelet     Status: None   Collection Time: 08/03/23 10:51 AM  Result Value Ref Range   WBC 6.9 3.8 - 10.8 Thousand/uL   RBC 4.45 3.80 - 5.10  Million/uL   Hemoglobin 12.4 11.7 - 15.5 g/dL   HCT 54.0 98.1 - 19.1 %   MCV 84.7 80.0 - 100.0 fL   MCH 27.9 27.0 - 33.0 pg   MCHC 32.9 32.0 - 36.0 g/dL    Comment: For adults, a slight decrease in the calculated MCHC value (in the range of 30 to 32 g/dL) is most likely not clinically significant; however, it should be interpreted with caution in correlation with other red cell parameters and the patient's clinical condition.    RDW 12.6 11.0 - 15.0 %   Platelets 210 140 - 400 Thousand/uL   MPV 11.6 7.5 - 12.5 fL   Neutro Abs 4,030 1,500 - 7,800 cells/uL   Absolute Lymphocytes 1,794 850 - 3,900 cells/uL   Absolute Monocytes 628 200 - 950 cells/uL   Eosinophils Absolute 407 15 - 500 cells/uL   Basophils Absolute 41 0 - 200 cells/uL   Neutrophils Relative % 58.4 %   Total Lymphocyte 26.0 %   Monocytes Relative 9.1 %   Eosinophils Relative 5.9 %   Basophils Relative 0.6 %  Comprehensive metabolic panel with GFR     Status: None   Collection Time: 08/03/23 10:51 AM  Result Value Ref Range   Glucose, Bld 80 65 - 99 mg/dL    Comment: .            Fasting reference interval .    BUN 8 7 - 25  mg/dL   Creat 4.78 2.95 - 6.21 mg/dL   eGFR 88 > OR = 60 HY/QMV/7.84O9   BUN/Creatinine Ratio SEE NOTE: 6 - 22 (calc)    Comment:    Not Reported: BUN and Creatinine are within    reference range. .    Sodium 139 135 - 146 mmol/L   Potassium 4.3 3.5 - 5.3 mmol/L   Chloride 108 98 - 110 mmol/L   CO2 26 20 - 32 mmol/L   Calcium 9.2 8.6 - 10.2 mg/dL   Total Protein 6.9 6.1 - 8.1 g/dL   Albumin 4.0 3.6 - 5.1 g/dL   Globulin 2.9 1.9 - 3.7 g/dL (calc)   AG Ratio 1.4 1.0 - 2.5 (calc)   Total Bilirubin 0.4 0.2 - 1.2 mg/dL   Alkaline phosphatase (APISO) 45 31 - 125 U/L   AST 14 10 - 30 U/L   ALT 9 6 - 29 U/L  Hemoglobin A1c     Status: None   Collection Time: 08/03/23 10:51 AM  Result Value Ref Range   Hgb A1c MFr Bld 5.4 <5.7 %    Comment: For the purpose of screening for the presence of diabetes: . <5.7%       Consistent with the absence of diabetes 5.7-6.4%    Consistent with increased risk for diabetes             (prediabetes) > or =6.5%  Consistent with diabetes . This assay result is consistent with a decreased risk of diabetes. . Currently, no consensus exists regarding use of hemoglobin A1c for diagnosis of diabetes in children. . According to American Diabetes Association (ADA) guidelines, hemoglobin A1c <7.0% represents optimal control in non-pregnant diabetic patients. Different metrics may apply to specific patient populations.  Standards of Medical Care in Diabetes(ADA). .    Mean Plasma Glucose 108 mg/dL   eAG (mmol/L) 6.0 mmol/L  RPR     Status: None   Collection Time: 08/03/23 10:51 AM  Result Value Ref Range   RPR Ser Ql NON-REACTIVE  NON-REACTIVE    Comment: . No laboratory evidence of syphilis. If recent exposure is suspected, submit a new sample in 2-4 weeks. Aaron Aas   HIV Antibody (routine testing w rflx)     Status: None   Collection Time: 08/03/23 10:51 AM  Result Value Ref Range   HIV 1&2 Ab, 4th Generation NON-REACTIVE NON-REACTIVE    Comment:  HIV-1 antigen and HIV-1/HIV-2 antibodies were not detected. There is no laboratory evidence of HIV infection. Aaron Aas PLEASE NOTE: This information has been disclosed to you from records whose confidentiality may be protected by state law.  If your state requires such protection, then the state law prohibits you from making any further disclosure of the information without the specific written consent of the person to whom it pertains, or as otherwise permitted by law. A general authorization for the release of medical or other information is NOT sufficient for this purpose. . For additional information please refer to http://education.[facility].com/faq/FAQ106 (This link is being provided for informational/ educational purposes only.) . Aaron Aas The performance of this assay has not been clinically validated in patients less than 5 years old. .      Assessment & Plan Peritonsillar abscess Suspected left-sided peritonsillar abscess due to left tonsil deviation and left ear pain. Differential includes tonsillitis. Emphasized hydration and soft food intake. - Prescribe Augmentin 875 mg BID for 10 days - Advise use of acetaminophen or ibuprofen  for pain management. - Instruct to consume soft foods and maintain hydration. - Advise emergency room visit if pain worsens or swallowing becomes difficult.

## 2023-09-02 ENCOUNTER — Ambulatory Visit

## 2023-09-02 ENCOUNTER — Telehealth: Payer: Self-pay

## 2023-09-02 VITALS — Ht 67.0 in | Wt 222.3 lb

## 2023-09-02 DIAGNOSIS — E66811 Obesity, class 1: Secondary | ICD-10-CM

## 2023-09-02 NOTE — Progress Notes (Signed)
 Patient is in office today for a nurse visit for a weight check for her Wegovy  PA. Weight and height documented.

## 2023-09-02 NOTE — Telephone Encounter (Signed)
 Note sent to RX auth team

## 2023-09-03 ENCOUNTER — Other Ambulatory Visit (HOSPITAL_COMMUNITY): Payer: Self-pay

## 2023-09-03 ENCOUNTER — Other Ambulatory Visit: Payer: Self-pay | Admitting: Family Medicine

## 2023-09-03 ENCOUNTER — Telehealth: Payer: Self-pay

## 2023-09-03 DIAGNOSIS — E66811 Obesity, class 1: Secondary | ICD-10-CM

## 2023-09-03 MED ORDER — SEMAGLUTIDE-WEIGHT MANAGEMENT 0.25 MG/0.5ML ~~LOC~~ SOAJ: 0.2500 mg | SUBCUTANEOUS | 0 refills | Status: AC

## 2023-09-03 MED ORDER — SEMAGLUTIDE-WEIGHT MANAGEMENT 0.5 MG/0.5ML ~~LOC~~ SOAJ
0.5000 mg | SUBCUTANEOUS | 0 refills | Status: DC
Start: 2023-10-02 — End: 2023-10-15

## 2023-09-03 NOTE — Telephone Encounter (Signed)
 Pharmacy Patient Advocate Encounter   Received notification from CoverMyMeds that prior authorization for Wegovy  0.5MG /0.5ML auto-injectors  is required/requested.   Insurance verification completed.   The patient is insured through [facility] [employer] .   Per test claim: PA required; PA submitted to above mentioned insurance via CoverMyMeds Key/confirmation #/EOC [id number] Status is pending

## 2023-09-03 NOTE — Telephone Encounter (Signed)
 Good morning, what strength Wegovy  and do you have a KEY# for CMM?

## 2023-09-06 ENCOUNTER — Other Ambulatory Visit (HOSPITAL_COMMUNITY): Payer: Self-pay

## 2023-09-07 ENCOUNTER — Other Ambulatory Visit (HOSPITAL_COMMUNITY): Payer: Self-pay

## 2023-09-08 ENCOUNTER — Other Ambulatory Visit (HOSPITAL_COMMUNITY): Payer: Self-pay

## 2023-09-08 NOTE — Telephone Encounter (Signed)
 Good morning - this has been approved!

## 2023-09-08 NOTE — Telephone Encounter (Signed)
 Pharmacy Patient Advocate Encounter  Received notification from [facility] that Prior Authorization for Wegovy  0.5MG /0.5ML auto-injectors has been APPROVED from 09/07/23 to 09/06/24. Unable to obtain price due to refill too soon rejection, last fill date 09/07/23 next available fill date06/26/25   PA #/Case ID/Reference #: [id number]

## 2023-10-15 ENCOUNTER — Encounter: Payer: Self-pay | Admitting: Family Medicine

## 2023-10-15 ENCOUNTER — Ambulatory Visit: Admitting: Family Medicine

## 2023-10-15 VITALS — BP 126/76 | HR 98 | Resp 16 | Ht 67.0 in | Wt 221.9 lb

## 2023-10-15 DIAGNOSIS — E8881 Metabolic syndrome: Secondary | ICD-10-CM

## 2023-10-15 DIAGNOSIS — L2082 Flexural eczema: Secondary | ICD-10-CM | POA: Diagnosis not present

## 2023-10-15 DIAGNOSIS — F325 Major depressive disorder, single episode, in full remission: Secondary | ICD-10-CM

## 2023-10-15 DIAGNOSIS — K5909 Other constipation: Secondary | ICD-10-CM

## 2023-10-15 DIAGNOSIS — J4520 Mild intermittent asthma, uncomplicated: Secondary | ICD-10-CM

## 2023-10-15 DIAGNOSIS — I10 Essential (primary) hypertension: Secondary | ICD-10-CM | POA: Diagnosis not present

## 2023-10-15 DIAGNOSIS — Z8639 Personal history of other endocrine, nutritional and metabolic disease: Secondary | ICD-10-CM

## 2023-10-15 DIAGNOSIS — E66811 Obesity, class 1: Secondary | ICD-10-CM | POA: Diagnosis not present

## 2023-10-15 DIAGNOSIS — N181 Chronic kidney disease, stage 1: Secondary | ICD-10-CM

## 2023-10-15 MED ORDER — WEGOVY 1 MG/0.5ML ~~LOC~~ SOAJ: 1.0000 mg | SUBCUTANEOUS | 0 refills | Status: DC

## 2023-10-15 NOTE — Progress Notes (Signed)
 Name: Rachel Dunlap   MRN: [id number]    DOB: July 26, 1995   Date:10/15/2023       Progress Note  Subjective  Chief Complaint  Chief Complaint  Patient presents with   Medical Management of Chronic Issues   Discussed the use of AI scribe software for clinical note transcription with the patient, who gave verbal consent to proceed.  History of Present Illness Rachel Dunlap is a 28 year old female with morbid obesity who presents for follow-up on weight management and medication adjustment.  She has a history of morbid obesity since childhood, which worsened after her teenage years. Her current weight is 221.9 pounds, down from a maximum of 265 pounds. She has been on Wegovy  for weight management but experienced a gap in medication due to pharmacy delays. She restarted at a dose of 0.25 mg and plans to increase to 0.5 mg soon. Her BMI has decreased from 35.75 in March to 34.75 currently. She has tried multiple diets and medications in the past. Currently cutting down on portions, eating lean protein and avoiding sweet drinks   She has obesity associated with other medical problems such as pre diabetes, chronic kidney disease stage 1  a and hypertension, for which she takes enalapril and atenolol . Her last kidney function test in April showed a normal GFR of 88, and her lipid panel showed a slightly elevated LDL at 124 mg/dL, but a good HDL at 60 mg/dL. She benefits from weight loss medication due to co-morbidities   She has a history of depression and has been seeing a therapist since summer 2021. She is currently taking Effexor  (venlafaxine ) 75 mg. 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  She experiences mild intermittent asthma, which is currently well-controlled, and eczema, for which she uses Topicort  cream. She reports a recent eczema flare-up due to heat. She  also has a history of chronic constipation, managed with Miralax  as needed, and takes over-the-counter vitamin D  and B12 supplements.  She has not had a herpes outbreak in a long time and takes Valtrex  as needed. She also takes birth control.    Patient Active Problem List   Diagnosis Date Noted   Secondary hyperparathyroidism of renal origin (HCC) 06/29/2023   B12 deficiency 08/13/2021   Obesity (BMI 30.0-34.9) 08/13/2021   Major depression in remission (HCC) 08/13/2021   Perennial allergic rhinitis with seasonal variation 08/13/2021   Eczema 08/31/2018   Insulin  resistance 05/16/2018   Prediabetes 02/14/2018   Essential hypertension 02/14/2018   Back pain due to injury 09/18/2016   Acne vulgaris 09/16/2015   Hypertrichosis 09/16/2015   Chronic constipation 11/09/2014   Chronic dermatitis 11/09/2014   Metabolic syndrome 11/09/2014   Hypertension, benign 11/09/2014   Genital herpes in women 11/09/2014   Mild intermittent asthma, uncomplicated 11/09/2014   Allergic rhinitis, seasonal 11/09/2014   IBS (irritable bowel syndrome) 11/09/2014   Bilateral polycystic ovarian syndrome 11/09/2014   Abnormal presence of protein in urine 11/09/2014   Vitamin D  deficiency 11/09/2014   Anxiety 11/09/2014    History reviewed. No pertinent surgical history.  Family History  Problem Relation Age of Onset   Hypertension Mother    Obesity Mother    Anxiety disorder Mother    Arthritis Mother    Depression Mother    Cancer Father    Diabetes Father    Kidney disease Father  Obesity Maternal Grandmother    Arthritis Maternal Grandmother    Asthma Maternal Grandfather    COPD Maternal Grandfather     Social History   Tobacco Use   Smoking status: Never   Smokeless tobacco: Never  Substance Use Topics   Alcohol use: Yes    Alcohol/week: 9.0 standard drinks of alcohol    Types: 1 Glasses of wine, 8 Standard drinks or equivalent per week    Comment: Water and soda sometimes      Current Outpatient Medications:    albuterol  (VENTOLIN  HFA) 108 (90 Base) MCG/ACT inhaler, Inhale 2 puffs into the lungs every 6 (six) hours as needed for wheezing or shortness of breath., Disp: 6.7 each, Rfl: 0   atenolol  (TENORMIN ) 25 MG tablet, Take 1 tablet (25 mg total) by mouth daily., Disp: 90 tablet, Rfl: 1   Blood Pressure Monitoring (OMRON 5 SERIES BP MONITOR) DEVI, , Disp: , Rfl:    desogestrel -ethinyl estradiol  (APRI ) 0.15-30 MG-MCG tablet, Take 1 tablet by mouth daily., Disp: 84 tablet, Rfl: 3   desoximetasone  (TOPICORT ) 0.25 % cream, Apply 1 Application topically 2 (two) times daily., Disp: 100 g, Rfl: 0   enalapril (VASOTEC) 5 MG tablet, Take 1 tablet by mouth daily., Disp: , Rfl:    fluticasone  (FLONASE ) 50 MCG/ACT nasal spray, Place 2 sprays into both nostrils at bedtime., Disp: 48 g, Rfl: 1   ketoconazole  (NIZORAL ) 2 % cream, APPLY 1 APPLICATION TOPICALLY AT BEDTIME., Disp: 60 g, Rfl: 0   loratadine  (CLARITIN ) 10 MG tablet, Take 1 tablet (10 mg total) by mouth daily., Disp: 90 tablet, Rfl: 1   montelukast  (SINGULAIR ) 10 MG tablet, TAKE 1 TABLET BY MOUTH EVERYDAY AT BEDTIME, Disp: 90 tablet, Rfl: 1   polyethylene glycol powder (GLYCOLAX /MIRALAX ) 17 GM/SCOOP powder, Take 17 g by mouth 2 (two) times daily as needed., Disp: 3350 g, Rfl: 1   triamcinolone  cream (KENALOG ) 0.1 %, Apply 1 Application topically 2 (two) times daily., Disp: 453.6 g, Rfl: 0   valACYclovir  (VALTREX ) 500 MG tablet, Take 1 tablet (500 mg total) by mouth daily. And twice daily on outbreaks, Disp: 115 tablet, Rfl: 1   venlafaxine  XR (EFFEXOR  XR) 75 MG 24 hr capsule, Take 1 capsule (75 mg total) by mouth daily with breakfast., Disp: 90 capsule, Rfl: 1   Vitamin D , Ergocalciferol , (DRISDOL ) 1.25 MG (50000 UNIT) CAPS capsule, Take 50,000 Units by mouth every 30 (thirty) days., Disp: , Rfl:    amoxicillin -clavulanate (AUGMENTIN ) 875-125 MG tablet, Take 1 tablet by mouth 2 (two) times daily. (Patient not taking:  Reported on 10/15/2023), Disp: 20 tablet, Rfl: 0   Semaglutide -Weight Management 0.5 MG/0.5ML SOAJ, Inject 0.5 mg into the skin once a week for 28 days. (Patient not taking: Reported on 10/15/2023), Disp: 2 mL, Rfl: 0  No Known Allergies  I personally reviewed active problem list, medication list, allergies, family history with the patient/caregiver today.   ROS  Ten systems reviewed and is negative except as mentioned in HPI    Objective Physical Exam VITALS: BP- 126/76 MEASUREMENTS: Weight- 221.9, BMI- 34.75. CONSTITUTIONAL: Patient appears well-developed and well-nourished.  No distress. HEENT: Head atraumatic, normocephalic, neck supple. CARDIOVASCULAR: Normal rate, regular rhythm and normal heart sounds.  No murmur heard. No BLE edema. PULMONARY: Effort normal and breath sounds normal. No respiratory distress. ABDOMINAL: There is no tenderness or distention. MUSCULOSKELETAL: Normal gait. Without gross motor or sensory deficit. PSYCHIATRIC: Patient has a normal mood and affect. behavior is normal. Judgment and thought content normal.  Vitals:   10/15/23 1429  BP: 126/76  Pulse: 98  Resp: 16  SpO2: 100%  Weight: 221 lb 14.4 oz (100.7 kg)  Height: 5' 7 (1.702 m)    Body mass index is 34.75 kg/m.  Recent Results (from the past 2160 hours)  Cervicovaginal ancillary only     Status: None   Collection Time: 08/03/23 10:41 AM  Result Value Ref Range   Neisseria Gonorrhea Negative    Chlamydia Negative    Trichomonas Negative    Comment Normal Reference Range Trichomonas - Negative    Comment Normal Reference Ranger Chlamydia - Negative    Comment      Normal Reference Range Neisseria Gonorrhea - Negative  Lipid panel     Status: Abnormal   Collection Time: 08/03/23 10:51 AM  Result Value Ref Range   Cholesterol 202 (H) <200 mg/dL   HDL 60 > OR = 50 mg/dL   Triglycerides 85 <849 mg/dL   LDL Cholesterol (Calc) 124 (H) mg/dL (calc)    Comment: Reference range:  <100 . Desirable range <100 mg/dL for primary prevention;   <70 mg/dL for patients with CHD or diabetic patients  with > or = 2 CHD risk factors. SABRA LDL-C is now calculated using the Martin-Hopkins  calculation, which is a validated novel method providing  better accuracy than the Friedewald equation in the  estimation of LDL-C.  Gladis APPLETHWAITE et al. SANDREA. 7986;689(80): 2061-2068  (http://education.[facility].com/faq/FAQ164)    Total CHOL/HDL Ratio 3.4 <5.0 (calc)   Non-HDL Cholesterol (Calc) 142 (H) <130 mg/dL (calc)    Comment: For patients with diabetes plus 1 major ASCVD risk  factor, treating to a non-HDL-C goal of <100 mg/dL  (LDL-C of <29 mg/dL) is considered a therapeutic  option.   CBC with Differential/Platelet     Status: None   Collection Time: 08/03/23 10:51 AM  Result Value Ref Range   WBC 6.9 3.8 - 10.8 Thousand/uL   RBC 4.45 3.80 - 5.10 Million/uL   Hemoglobin 12.4 11.7 - 15.5 g/dL   HCT 62.2 64.9 - 54.9 %   MCV 84.7 80.0 - 100.0 fL   MCH 27.9 27.0 - 33.0 pg   MCHC 32.9 32.0 - 36.0 g/dL    Comment: For adults, a slight decrease in the calculated MCHC value (in the range of 30 to 32 g/dL) is most likely not clinically significant; however, it should be interpreted with caution in correlation with other red cell parameters and the patient's clinical condition.    RDW 12.6 11.0 - 15.0 %   Platelets 210 140 - 400 Thousand/uL   MPV 11.6 7.5 - 12.5 fL   Neutro Abs 4,030 1,500 - 7,800 cells/uL   Absolute Lymphocytes 1,794 850 - 3,900 cells/uL   Absolute Monocytes 628 200 - 950 cells/uL   Eosinophils Absolute 407 15 - 500 cells/uL   Basophils Absolute 41 0 - 200 cells/uL   Neutrophils Relative % 58.4 %   Total Lymphocyte 26.0 %   Monocytes Relative 9.1 %   Eosinophils Relative 5.9 %   Basophils Relative 0.6 %  Comprehensive metabolic panel with GFR     Status: None   Collection Time: 08/03/23 10:51 AM  Result Value Ref Range   Glucose, Bld 80 65 - 99  mg/dL    Comment: .            Fasting reference interval .    BUN 8 7 - 25 mg/dL   Creat 9.08 9.49 - 9.03 mg/dL  eGFR 88 > OR = 60 mL/min/1.44m2   BUN/Creatinine Ratio SEE NOTE: 6 - 22 (calc)    Comment:    Not Reported: BUN and Creatinine are within    reference range. .    Sodium 139 135 - 146 mmol/L   Potassium 4.3 3.5 - 5.3 mmol/L   Chloride 108 98 - 110 mmol/L   CO2 26 20 - 32 mmol/L   Calcium 9.2 8.6 - 10.2 mg/dL   Total Protein 6.9 6.1 - 8.1 g/dL   Albumin 4.0 3.6 - 5.1 g/dL   Globulin 2.9 1.9 - 3.7 g/dL (calc)   AG Ratio 1.4 1.0 - 2.5 (calc)   Total Bilirubin 0.4 0.2 - 1.2 mg/dL   Alkaline phosphatase (APISO) 45 31 - 125 U/L   AST 14 10 - 30 U/L   ALT 9 6 - 29 U/L  Hemoglobin A1c     Status: None   Collection Time: 08/03/23 10:51 AM  Result Value Ref Range   Hgb A1c MFr Bld 5.4 <5.7 %    Comment: For the purpose of screening for the presence of diabetes: . <5.7%       Consistent with the absence of diabetes 5.7-6.4%    Consistent with increased risk for diabetes             (prediabetes) > or =6.5%  Consistent with diabetes . This assay result is consistent with a decreased risk of diabetes. . Currently, no consensus exists regarding use of hemoglobin A1c for diagnosis of diabetes in children. . According to American Diabetes Association (ADA) guidelines, hemoglobin A1c <7.0% represents optimal control in non-pregnant diabetic patients. Different metrics may apply to specific patient populations.  Standards of Medical Care in Diabetes(ADA). .    Mean Plasma Glucose 108 mg/dL   eAG (mmol/L) 6.0 mmol/L  RPR     Status: None   Collection Time: 08/03/23 10:51 AM  Result Value Ref Range   RPR Ser Ql NON-REACTIVE NON-REACTIVE    Comment: . No laboratory evidence of syphilis. If recent exposure is suspected, submit a new sample in 2-4 weeks. SABRA   HIV Antibody (routine testing w rflx)     Status: None   Collection Time: 08/03/23 10:51 AM  Result Value  Ref Range   HIV 1&2 Ab, 4th Generation NON-REACTIVE NON-REACTIVE    Comment: HIV-1 antigen and HIV-1/HIV-2 antibodies were not detected. There is no laboratory evidence of HIV infection. SABRA PLEASE NOTE: This information has been disclosed to you from records whose confidentiality may be protected by state law.  If your state requires such protection, then the state law prohibits you from making any further disclosure of the information without the specific written consent of the person to whom it pertains, or as otherwise permitted by law. A general authorization for the release of medical or other information is NOT sufficient for this purpose. . For additional information please refer to http://education.[facility].com/faq/FAQ106 (This link is being provided for informational/ educational purposes only.) . SABRA The performance of this assay has not been clinically validated in patients less than 25 years old. SABRA   POCT rapid strep A     Status: Abnormal   Collection Time: 08/27/23  2:33 PM  Result Value Ref Range   Rapid Strep A Screen Positive (A) Negative     PHQ2/9:    10/15/2023    2:29 PM 08/27/2023    2:29 PM 06/29/2023    1:29 PM 04/13/2023    1:11 PM 12/09/2022  2:57 PM  Depression screen PHQ 2/9  Decreased Interest 0 0 0 0 0  Down, Depressed, Hopeless 0 0 0 0 0  PHQ - 2 Score 0 0 0 0 0  Altered sleeping 0 0 0 0 0  Tired, decreased energy 0 0 0 0 1  Change in appetite 0 0 0 0 0  Feeling bad or failure about yourself  0 0 0 0 0  Trouble concentrating 0 0 0 0 0  Moving slowly or fidgety/restless 0 0 0 0 0  Suicidal thoughts 0 0 0 0 0  PHQ-9 Score 0 0 0 0 1  Difficult doing work/chores Not difficult at all Not difficult at all Not difficult at all Not difficult at all Not difficult at all    phq 9 is negative  Fall Risk:    10/15/2023    2:23 PM 08/27/2023    2:20 PM 04/13/2023    1:07 PM 12/09/2022    2:57 PM 09/14/2022    1:30 PM  Fall Risk   Falls in  the past year? 0 0 0 0 0  Number falls in past yr: 0 0 0  0  Injury with Fall? 0 0 0  0  Risk for fall due to : No Fall Risks No Fall Risks No Fall Risks No Fall Risks   Follow up Falls evaluation completed Falls prevention discussed;Education provided;Falls evaluation completed Falls prevention discussed;Education provided;Falls evaluation completed Falls prevention discussed      Assessment & Plan Obesity Morbid obesity with BMI reduction from 35.75 to 34.75. Lost 34 pounds from 265 pounds. On Wegovy , starting at 0.25 mg, increasing to 0.5 mg. Goal: 5% weight loss to improve prediabetes and PCOS. - Continue Wegovy , increase to 0.5 mg. - Aim to lose 5% of current weight. - Encourage dietary changes: more protein, vegetables, avoid high-carb foods. - Increase physical activity.  Prediabetes Prediabetes linked to obesity. Weight loss and GLP-1 receptor agonists improve insulin  resistance. - Continue weight loss efforts with Wegovy  and lifestyle modifications.   Metabolic syndrome Metabolic syndrome with obesity and prediabetes. GLP-1 receptor agonists manage insulin  resistance. - Continue Wegovy  and lifestyle modifications.  Chronic kidney disease stage 1 with microalbuminuria  GFR normal at 88. No kidney insufficiency symptoms. Managed by nephrologist. On enalapril for hypertension and kidney protection. - Continue monitoring kidney function. - Continue enalapril.  Hypertension Hypertension controlled with enalapril and atenolol . BP 126/76. Aware of medication changes if planning pregnancy. - Continue enalapril and atenolol .  Secondary hyperparathyroidism Monitored by nephrologist. High parathyroid levels last checked in September. Normal protein creatinine ratio. - Continue annual follow-up with nephrologist.  Asthma Mild intermittent asthma controlled with montelukast . - Continue montelukast  as needed.  Eczema Eczema flares managed with topical corticosteroids. Flares in  hot weather. - Continue using Topicort  as needed.  Depression Managed with venlafaxine  75 mg. Attending therapy since 2021. Positive about job promotion. - Continue venlafaxine  75 mg. - Continue therapy sessions.  Herpes simplex virus infection Managed with Valtrex . No recent outbreaks. - Continue Valtrex  as needed.  Chronic constipation Managed with Miralax . - Continue Miralax  as needed.

## 2024-01-17 ENCOUNTER — Encounter: Payer: Self-pay | Admitting: Family Medicine

## 2024-01-17 ENCOUNTER — Other Ambulatory Visit: Payer: Self-pay | Admitting: Family Medicine

## 2024-01-17 ENCOUNTER — Ambulatory Visit: Admitting: Family Medicine

## 2024-01-17 VITALS — BP 122/78 | HR 87 | Resp 16 | Ht 67.0 in | Wt 202.8 lb

## 2024-01-17 DIAGNOSIS — J4520 Mild intermittent asthma, uncomplicated: Secondary | ICD-10-CM

## 2024-01-17 DIAGNOSIS — Z8419 Family history of other disorders of kidney and ureter: Secondary | ICD-10-CM

## 2024-01-17 DIAGNOSIS — F325 Major depressive disorder, single episode, in full remission: Secondary | ICD-10-CM | POA: Diagnosis not present

## 2024-01-17 DIAGNOSIS — N181 Chronic kidney disease, stage 1: Secondary | ICD-10-CM | POA: Diagnosis not present

## 2024-01-17 DIAGNOSIS — E66811 Obesity, class 1: Secondary | ICD-10-CM

## 2024-01-17 DIAGNOSIS — N2581 Secondary hyperparathyroidism of renal origin: Secondary | ICD-10-CM | POA: Diagnosis not present

## 2024-01-17 DIAGNOSIS — Z23 Encounter for immunization: Secondary | ICD-10-CM

## 2024-01-17 DIAGNOSIS — Z8679 Personal history of other diseases of the circulatory system: Secondary | ICD-10-CM | POA: Diagnosis not present

## 2024-01-17 DIAGNOSIS — J3089 Other allergic rhinitis: Secondary | ICD-10-CM

## 2024-01-17 DIAGNOSIS — Z803 Family history of malignant neoplasm of breast: Secondary | ICD-10-CM | POA: Diagnosis not present

## 2024-01-17 MED ORDER — MONTELUKAST SODIUM 10 MG PO TABS: ORAL_TABLET | ORAL | 1 refills | Status: AC

## 2024-01-17 MED ORDER — LORATADINE 10 MG PO TABS: 10.0000 mg | ORAL_TABLET | Freq: Every day | ORAL | 1 refills | Status: AC

## 2024-01-17 MED ORDER — VENLAFAXINE HCL ER 75 MG PO CP24: 75.0000 mg | ORAL_CAPSULE | Freq: Every day | ORAL | 1 refills | Status: AC

## 2024-01-17 MED ORDER — WEGOVY 1.7 MG/0.75ML ~~LOC~~ SOAJ: 1.7000 mg | SUBCUTANEOUS | 0 refills | Status: DC

## 2024-01-17 NOTE — Progress Notes (Signed)
 Name: Rachel Dunlap   MRN: [id number]    DOB: March 27, 1996   Date:01/17/2024       Progress Note  Subjective  Chief Complaint  Chief Complaint  Patient presents with   Medical Management of Chronic Issues   Discussed the use of AI scribe software for clinical note transcription with the patient, who gave verbal consent to proceed.  History of Present Illness Rachel Dunlap is a 28 year old female with obesity and hypertension who presents for follow-up on weight management and blood pressure control.  She has a long-standing history of obesity, which has progressively worsened over the past 15 years. Initially weighing 265 pounds, she began treatment with Wegovy . A lapse in insurance coverage led to a temporary discontinuation of the medication, but it was resumed at 0.25 mg in July. Since resuming, she has experienced significant weight loss, currently weighing 202.8 pounds, a reduction of 20 pounds since her last visit. 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  Her history of hypertension was previously managed with atenolol . Following her weight loss, she has discontinued the medication. She has a family history of chronic kidney disease and is planning to undergo genetic testing due to her father's history of renal disease and cancer.  She has a history of asthma, which is currently well-controlled. She experiences no recent wheezing or coughing and can perform physical activities without shortness of breath. Her asthma management includes daily Montelukast  and as-needed use of Flonase  and Ventolin . She also experiences year-round allergies, managed with loratadine .  She has a history of major depression, currently in remission. She continues to take Effexor  and notes improvement in her depression score.  Her family history is significant for cancer on her father's side, including  breast, lung, throat, and head and neck cancers. Her paternal grandmother had breast cancer.    Patient Active Problem List   Diagnosis Date Noted   Secondary hyperparathyroidism of renal origin 06/29/2023   B12 deficiency 08/13/2021   Obesity (BMI 30.0-34.9) 08/13/2021   Major depression in remission 08/13/2021   Perennial allergic rhinitis with seasonal variation 08/13/2021   Eczema 08/31/2018   Insulin  resistance 05/16/2018   Prediabetes 02/14/2018   Essential hypertension 02/14/2018   Back pain due to injury 09/18/2016   Acne vulgaris 09/16/2015   Hypertrichosis 09/16/2015   Chronic constipation 11/09/2014   Chronic dermatitis 11/09/2014   Metabolic syndrome 11/09/2014   Hypertension, benign 11/09/2014   Genital herpes in women 11/09/2014   Mild intermittent asthma, uncomplicated 11/09/2014   Allergic rhinitis, seasonal 11/09/2014   IBS (irritable bowel syndrome) 11/09/2014   Bilateral polycystic ovarian syndrome 11/09/2014   Abnormal presence of protein in urine 11/09/2014   Vitamin D  deficiency 11/09/2014   Anxiety 11/09/2014    History reviewed. No pertinent surgical history.  Family History  Problem Relation Age of Onset   Hypertension Mother    Obesity Mother    Anxiety disorder Mother    Arthritis Mother    Depression Mother    Cancer Father    Diabetes Father    Kidney disease Father    Obesity Maternal Grandmother    Arthritis Maternal Grandmother    Asthma Maternal Grandfather    COPD Maternal Grandfather     Social History   Tobacco Use   Smoking status: Never   Smokeless tobacco: Never  Substance Use Topics   Alcohol use: Yes    Alcohol/week:  9.0 standard drinks of alcohol    Types: 1 Glasses of wine, 8 Standard drinks or equivalent per week    Comment: Water and soda sometimes     Current Outpatient Medications:    albuterol  (VENTOLIN  HFA) 108 (90 Base) MCG/ACT inhaler, Inhale 2 puffs into the lungs every 6 (six) hours as needed for  wheezing or shortness of breath., Disp: 6.7 each, Rfl: 0   atenolol  (TENORMIN ) 25 MG tablet, Take 1 tablet (25 mg total) by mouth daily., Disp: 90 tablet, Rfl: 1   Blood Pressure Monitoring (OMRON 5 SERIES BP MONITOR) DEVI, , Disp: , Rfl:    Cholecalciferol (VITAMIN D ) 50 MCG (2000 UT) CAPS, Take 1 capsule by mouth daily after breakfast., Disp: , Rfl:    cyanocobalamin  (VITAMIN B12) 500 MCG tablet, Take 500 mcg by mouth daily., Disp: , Rfl:    desogestrel -ethinyl estradiol  (APRI ) 0.15-30 MG-MCG tablet, Take 1 tablet by mouth daily., Disp: 84 tablet, Rfl: 3   desoximetasone  (TOPICORT ) 0.25 % cream, Apply 1 Application topically 2 (two) times daily., Disp: 100 g, Rfl: 0   enalapril (VASOTEC) 5 MG tablet, Take 1 tablet by mouth daily., Disp: , Rfl:    fluticasone  (FLONASE ) 50 MCG/ACT nasal spray, Place 2 sprays into both nostrils at bedtime., Disp: 48 g, Rfl: 1   ketoconazole  (NIZORAL ) 2 % cream, APPLY 1 APPLICATION TOPICALLY AT BEDTIME., Disp: 60 g, Rfl: 0   loratadine  (CLARITIN ) 10 MG tablet, Take 1 tablet (10 mg total) by mouth daily., Disp: 90 tablet, Rfl: 1   montelukast  (SINGULAIR ) 10 MG tablet, TAKE 1 TABLET BY MOUTH EVERYDAY AT BEDTIME, Disp: 90 tablet, Rfl: 1   polyethylene glycol powder (GLYCOLAX /MIRALAX ) 17 GM/SCOOP powder, Take 17 g by mouth 2 (two) times daily as needed., Disp: 3350 g, Rfl: 1   triamcinolone  cream (KENALOG ) 0.1 %, Apply 1 Application topically 2 (two) times daily., Disp: 453.6 g, Rfl: 0   valACYclovir  (VALTREX ) 500 MG tablet, Take 1 tablet (500 mg total) by mouth daily. And twice daily on outbreaks, Disp: 115 tablet, Rfl: 1   venlafaxine  XR (EFFEXOR  XR) 75 MG 24 hr capsule, Take 1 capsule (75 mg total) by mouth daily with breakfast., Disp: 90 capsule, Rfl: 1   WEGOVY  1.7 MG/0.75ML SOAJ SQ injection, Inject 1.7 mg into the skin once a week., Disp: , Rfl:    Semaglutide -Weight Management (WEGOVY ) 1 MG/0.5ML SOAJ, Inject 1 mg into the skin once a week. (Patient not taking:  Reported on 01/17/2024), Disp: 2 mL, Rfl: 0  No Known Allergies  I personally reviewed active problem list, medication list, allergies with the patient/caregiver today.   ROS  Ten systems reviewed and is negative except as mentioned in HPI    Objective Physical Exam  CONSTITUTIONAL: Patient appears well-developed and well-nourished.  No distress. HEENT: Head atraumatic, normocephalic, neck supple. CARDIOVASCULAR: Normal rate, regular rhythm and normal heart sounds.  No murmur heard. No BLE edema. PULMONARY: Effort normal and breath sounds normal. No respiratory distress. ABDOMINAL: There is no tenderness or distention. MUSCULOSKELETAL: Normal gait. Without gross motor or sensory deficit. PSYCHIATRIC: Patient has a normal mood and affect. behavior is normal. Judgment and thought content normal.  Vitals:   01/17/24 1429  BP: 122/78  Pulse: 87  Resp: 16  SpO2: 99%  Weight: 202 lb 12.8 oz (92 kg)  Height: 5' 7 (1.702 m)    Body mass index is 31.76 kg/m.   PHQ2/9:    01/17/2024    2:27 PM 10/15/2023  2:29 PM 08/27/2023    2:29 PM 06/29/2023    1:29 PM 04/13/2023    1:11 PM  Depression screen PHQ 2/9  Decreased Interest 0 0 0 0 0  Down, Depressed, Hopeless 0 0 0 0 0  PHQ - 2 Score 0 0 0 0 0  Altered sleeping 0 0 0 0 0  Tired, decreased energy 0 0 0 0 0  Change in appetite 0 0 0 0 0  Feeling bad or failure about yourself  0 0 0 0 0  Trouble concentrating 0 0 0 0 0  Moving slowly or fidgety/restless 0 0 0 0 0  Suicidal thoughts 0 0 0 0 0  PHQ-9 Score 0 0 0 0 0  Difficult doing work/chores Not difficult at all Not difficult at all Not difficult at all Not difficult at all Not difficult at all    phq 9 is negative  Fall Risk:    01/17/2024    2:27 PM 10/15/2023    2:23 PM 08/27/2023    2:20 PM 04/13/2023    1:07 PM 12/09/2022    2:57 PM  Fall Risk   Falls in the past year? 0 0 0 0 0  Number falls in past yr: 0 0 0 0   Injury with Fall? 0 0 0 0   Risk for fall due  to : No Fall Risks No Fall Risks No Fall Risks No Fall Risks No Fall Risks  Follow up Falls evaluation completed Falls evaluation completed Falls prevention discussed;Education provided;Falls evaluation completed Falls prevention discussed;Education provided;Falls evaluation completed Falls prevention discussed     Assessment & Plan Obesity, status post significant weight loss on Wegovy  Obesity since childhood, improved with Wegovy  and increased activity. Weight reduced from 265 to 202.8 pounds. - Continue Wegovy  1.7 mg for three months. - Encourage continued physical activity and dietary modifications.  Chronic kidney disease stage 1 with proteinuria Kidney function normalized over time. - Proceed with genetic testing for renal disease.  Hypertension, currently normotensive off medication Previously on atenolol , now normotensive likely due to weight loss. - Monitor blood pressure at home. - Follow up with Doctor Richardo in 5-6 weeks.  Major depressive disorder in remission In remission on Effexor , improved depression scores, potential for seasonal affective disorder. - Continue Effexor . - Monitor for symptoms of depression, especially during fall and holiday seasons.  Asthma Well-controlled, no symptoms of wheezing or shortness of breath. - Continue Montelukast  daily. - Ensure adequate supply of Ventolin  and Flonase .  Allergic rhinitis Year-round symptoms, exacerbated in spring and fall. - Continue loratadine . - Ensure adequate supply of Flonase .  Secondary hyperparathyroidism History of secondary hyperparathyroidism, last checked in September. - Monitor parathyroid hormone levels at next visit.  Family history of cancer Family history of cancer on father's side, discussed genetic testing importance. - Consider referral to a geneticist for cancer screening based on family history.

## 2024-01-18 ENCOUNTER — Telehealth: Payer: Self-pay | Admitting: Pharmacy Technician

## 2024-01-18 ENCOUNTER — Other Ambulatory Visit (HOSPITAL_COMMUNITY): Payer: Self-pay

## 2024-01-18 NOTE — Telephone Encounter (Signed)
Please proceed with PA

## 2024-01-18 NOTE — Telephone Encounter (Signed)

## 2024-01-21 DIAGNOSIS — R809 Proteinuria, unspecified: Secondary | ICD-10-CM | POA: Diagnosis not present

## 2024-01-21 DIAGNOSIS — N182 Chronic kidney disease, stage 2 (mild): Secondary | ICD-10-CM | POA: Diagnosis not present

## 2024-01-25 ENCOUNTER — Other Ambulatory Visit: Payer: Self-pay | Admitting: Family Medicine

## 2024-01-25 DIAGNOSIS — E66811 Obesity, class 1: Secondary | ICD-10-CM

## 2024-02-28 ENCOUNTER — Encounter: Payer: Self-pay | Admitting: Family Medicine

## 2024-04-18 ENCOUNTER — Telehealth: Payer: Self-pay | Admitting: Pharmacy Technician

## 2024-04-18 ENCOUNTER — Other Ambulatory Visit (HOSPITAL_COMMUNITY): Payer: Self-pay

## 2024-04-18 ENCOUNTER — Encounter: Payer: Self-pay | Admitting: Family Medicine

## 2024-04-18 ENCOUNTER — Ambulatory Visit: Admitting: Family Medicine

## 2024-04-18 VITALS — BP 120/76 | HR 66 | Resp 16 | Ht 67.0 in | Wt 205.1 lb

## 2024-04-18 DIAGNOSIS — J302 Other seasonal allergic rhinitis: Secondary | ICD-10-CM

## 2024-04-18 DIAGNOSIS — N2581 Secondary hyperparathyroidism of renal origin: Secondary | ICD-10-CM | POA: Diagnosis not present

## 2024-04-18 DIAGNOSIS — Z8679 Personal history of other diseases of the circulatory system: Secondary | ICD-10-CM

## 2024-04-18 DIAGNOSIS — E8881 Metabolic syndrome: Secondary | ICD-10-CM

## 2024-04-18 DIAGNOSIS — J4520 Mild intermittent asthma, uncomplicated: Secondary | ICD-10-CM | POA: Diagnosis not present

## 2024-04-18 DIAGNOSIS — L308 Other specified dermatitis: Secondary | ICD-10-CM | POA: Diagnosis not present

## 2024-04-18 DIAGNOSIS — E66811 Obesity, class 1: Secondary | ICD-10-CM | POA: Diagnosis not present

## 2024-04-18 DIAGNOSIS — F325 Major depressive disorder, single episode, in full remission: Secondary | ICD-10-CM | POA: Diagnosis not present

## 2024-04-18 DIAGNOSIS — L2082 Flexural eczema: Secondary | ICD-10-CM

## 2024-04-18 DIAGNOSIS — J3089 Other allergic rhinitis: Secondary | ICD-10-CM | POA: Diagnosis not present

## 2024-04-18 MED ORDER — PIMECROLIMUS 1 % EX CREA: TOPICAL_CREAM | Freq: Two times a day (BID) | CUTANEOUS | 2 refills | Status: AC

## 2024-04-18 MED ORDER — DESOXIMETASONE 0.25 % EX CREA: 1.0000 | TOPICAL_CREAM | Freq: Two times a day (BID) | CUTANEOUS | 0 refills | Status: AC

## 2024-04-18 MED ORDER — TRIAMCINOLONE ACETONIDE 0.1 % EX CREA: 1.0000 | TOPICAL_CREAM | Freq: Two times a day (BID) | CUTANEOUS | 0 refills | Status: AC

## 2024-04-18 MED ORDER — WEGOVY 0.25 MG/0.5ML ~~LOC~~ SOAJ: 0.2500 mg | SUBCUTANEOUS | 0 refills | Status: AC

## 2024-04-18 NOTE — Telephone Encounter (Signed)
 Pharmacy Patient Advocate Encounter   Received notification from CoverMyMeds that prior authorization for Desoximetasone  0.25% cream is required/requested.   Insurance verification completed.   The patient is insured through [employer].   Per test claim: PA required; PA started via CoverMyMeds. KEY [id number] . Waiting for clinical questions to populate.

## 2024-04-18 NOTE — Progress Notes (Signed)
 Name: Rachel Dunlap   MRN: [id number]    DOB: 06-05-1995   Date:04/18/2024       Progress Note  Subjective  Chief Complaint  Chief Complaint  Patient presents with   Medical Management of Chronic Issues   Discussed the use of AI scribe software for clinical note transcription with the patient, who gave verbal consent to proceed.  History of Present Illness Rachel Dunlap is a 29 year old female who presents for a routine follow-up.  She has a long history of obesity, with her weight peaking at 265 pounds in her mid twenties . Despite various interventions, including Ozempic , Weight Watchers, and intermittent fasting, her weight remained stable in the 250s. Since last year, she has been on Wegovy , which has effectively reduced her weight to 205 pounds from 256 pounds. She experienced a brief gap in medication coverage but has maintained her weight loss through healthy eating and staying active at work.  She has a history of hypertension, which was previously high but has improved significantly with her weight loss.  She has mild intermittent asthma and continues to use Singulair  (montelukast ) and Ventolin  as needed. No current wheezing or cough.  She has a history of eczema, which has worsened recently, particularly on her tummy, arm creases, and neck. She has used various treatments in the past, including Elidel  and hydrocortisone , and is cautious about using strong medications on sensitive areas.  She has a history of anxiety and depression, which is currently in remission. 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  She has a history of secondary hyperparathyroidism of renal origin.    Patient Active Problem List   Diagnosis Date Noted   Secondary hyperparathyroidism of renal origin 06/29/2023   B12 deficiency 08/13/2021   Obesity (BMI 30.0-34.9) 08/13/2021   Major depression in remission 08/13/2021    Perennial allergic rhinitis with seasonal variation 08/13/2021   Eczema 08/31/2018   Insulin  resistance 05/16/2018   Prediabetes 02/14/2018   Essential hypertension 02/14/2018   Back pain due to injury 09/18/2016   Acne vulgaris 09/16/2015   Hypertrichosis 09/16/2015   Chronic constipation 11/09/2014   Chronic dermatitis 11/09/2014   Metabolic syndrome 11/09/2014   Hypertension, benign 11/09/2014   Genital herpes in women 11/09/2014   Mild intermittent asthma, uncomplicated 11/09/2014   Allergic rhinitis, seasonal 11/09/2014   IBS (irritable bowel syndrome) 11/09/2014   Bilateral polycystic ovarian syndrome 11/09/2014   Abnormal presence of protein in urine 11/09/2014   Vitamin D  deficiency 11/09/2014   Anxiety 11/09/2014    History reviewed. No pertinent surgical history.  Family History  Problem Relation Age of Onset   Hypertension Mother    Obesity Mother    Anxiety disorder Mother    Arthritis Mother    Depression Mother    Cancer Father    Diabetes Father    Kidney disease Father    Obesity Maternal Grandmother    Arthritis Maternal Grandmother    Asthma Maternal Grandfather    COPD Maternal Grandfather     Social History   Tobacco Use   Smoking status: Never   Smokeless tobacco: Never  Substance Use Topics   Alcohol use: Yes    Alcohol/week: 9.0 standard drinks of alcohol    Types: 1 Glasses of wine, 8 Standard drinks or equivalent per week    Comment: Water and soda sometimes    Current Medications[1]  Allergies[2]  I personally reviewed active problem  list, medication list, allergies, family history with the patient/caregiver today.   ROS  Ten systems reviewed and is negative except as mentioned in HPI    Objective Physical Exam  CONSTITUTIONAL: Patient appears well-developed and well-nourished.  No distress. HEENT: Head atraumatic, normocephalic, neck supple. CARDIOVASCULAR: Normal rate, regular rhythm and normal heart sounds.  No murmur  heard. No BLE edema. PULMONARY: Effort normal and breath sounds normal. No respiratory distress. ABDOMINAL: There is no tenderness or distention. MUSCULOSKELETAL: Normal gait. Without gross motor or sensory deficit. PSYCHIATRIC: Patient has a normal mood and affect. behavior is normal. Judgment and thought content normal.  Vitals:   04/18/24 1542  BP: 120/76  Pulse: 66  Resp: 16  SpO2: 97%  Weight: 205 lb 1.6 oz (93 kg)  Height: 5' 7 (1.702 m)    Body mass index is 32.12 kg/m.    PHQ2/9:    04/18/2024    3:41 PM 01/17/2024    2:27 PM 10/15/2023    2:29 PM 08/27/2023    2:29 PM 06/29/2023    1:29 PM  Depression screen PHQ 2/9  Decreased Interest 0 0 0 0 0  Down, Depressed, Hopeless 0 0 0 0 0  PHQ - 2 Score 0 0 0 0 0  Altered sleeping 0 0 0 0 0  Tired, decreased energy 0 0 0 0 0  Change in appetite 0 0 0 0 0  Feeling bad or failure about yourself  0 0 0 0 0  Trouble concentrating 0 0 0 0 0  Moving slowly or fidgety/restless 0 0 0 0 0  Suicidal thoughts 0 0 0 0 0  PHQ-9 Score 0 0  0  0  0   Difficult doing work/chores Not difficult at all Not difficult at all Not difficult at all Not difficult at all Not difficult at all     Data saved with a previous flowsheet row definition    phq 9 is negative  Fall Risk:    04/18/2024    3:41 PM 01/17/2024    2:27 PM 10/15/2023    2:23 PM 08/27/2023    2:20 PM 04/13/2023    1:07 PM  Fall Risk   Falls in the past year? 0 0 0 0 0  Number falls in past yr: 0 0 0 0 0  Injury with Fall? 0 0  0  0  0   Risk for fall due to : No Fall Risks No Fall Risks No Fall Risks No Fall Risks No Fall Risks  Follow up Falls evaluation completed Falls evaluation completed Falls evaluation completed Falls prevention discussed;Education provided;Falls evaluation completed Falls prevention discussed;Education provided;Falls evaluation completed     Data saved with a previous flowsheet row definition      Assessment & Plan Obesity Significant  weight loss from 256 lbs to 205 lbs over the past year. Good response to Wegovy  despite medication gap. Active lifestyle and healthy diet maintained. Insurance coverage for Wegovy  uncertain. - Sent prescription for Wegovy  and checked insurance approval. - Continue healthy eating and physical activity. - Monitor weight and adjust medication dosage as needed.  Mild intermittent asthma Well-controlled with montelukast  and Ventolin  as needed. - Continue montelukast  and Ventolin  as needed.  Allergic rhinitis Managed with Flonase  and Claritin . - Continue Flonase  and Claritin  as needed.  Major depressive disorder in remission In remission. Effexor  continued for anxiety management, aiding appetite control and stress management. - Continue Effexor  for anxiety management.  Metabolic syndrome Previous hypertension and hyperglycemia  improved with weight loss. - Continue monitoring blood pressure and glucose levels.  Flexural and other eczema Eczema flare-ups on tummy, arms, neck, and other areas. Previous treatments include Elidel  and hydrocortisone . Topicort  considered for non-sensitive areas. - Prescribed Elidel  for sensitive areas. - Prescribed hydrocortisone  for severe flare-ups. - Prescribed Topicort  for non-sensitive areas if approved.  General Health Maintenance Flu shot received in October. - Ensure flu shot is up to date annually.        [1]  Current Outpatient Medications:    albuterol  (VENTOLIN  HFA) 108 (90 Base) MCG/ACT inhaler, Inhale 2 puffs into the lungs every 6 (six) hours as needed for wheezing or shortness of breath., Disp: 6.7 each, Rfl: 0   Blood Pressure Monitoring (OMRON 5 SERIES BP MONITOR) DEVI, , Disp: , Rfl:    Cholecalciferol (VITAMIN D ) 50 MCG (2000 UT) CAPS, Take 1 capsule by mouth daily after breakfast., Disp: , Rfl:    cyanocobalamin  (VITAMIN B12) 500 MCG tablet, Take 500 mcg by mouth daily., Disp: , Rfl:    desogestrel -ethinyl estradiol  (APRI ) 0.15-30  MG-MCG tablet, Take 1 tablet by mouth daily., Disp: 84 tablet, Rfl: 3   desoximetasone  (TOPICORT ) 0.25 % cream, Apply 1 Application topically 2 (two) times daily., Disp: 100 g, Rfl: 0   fluticasone  (FLONASE ) 50 MCG/ACT nasal spray, Place 2 sprays into both nostrils at bedtime., Disp: 48 g, Rfl: 1   ketoconazole  (NIZORAL ) 2 % cream, APPLY 1 APPLICATION TOPICALLY AT BEDTIME., Disp: 60 g, Rfl: 0   loratadine  (CLARITIN ) 10 MG tablet, Take 1 tablet (10 mg total) by mouth daily., Disp: 90 tablet, Rfl: 1   montelukast  (SINGULAIR ) 10 MG tablet, TAKE 1 TABLET BY MOUTH EVERYDAY AT BEDTIME, Disp: 90 tablet, Rfl: 1   polyethylene glycol powder (GLYCOLAX /MIRALAX ) 17 GM/SCOOP powder, Take 17 g by mouth 2 (two) times daily as needed., Disp: 3350 g, Rfl: 1   triamcinolone  cream (KENALOG ) 0.1 %, Apply 1 Application topically 2 (two) times daily., Disp: 453.6 g, Rfl: 0   valACYclovir  (VALTREX ) 500 MG tablet, Take 1 tablet (500 mg total) by mouth daily. And twice daily on outbreaks, Disp: 115 tablet, Rfl: 1   venlafaxine  XR (EFFEXOR  XR) 75 MG 24 hr capsule, Take 1 capsule (75 mg total) by mouth daily with breakfast., Disp: 90 capsule, Rfl: 1   WEGOVY  1.7 MG/0.75ML SOAJ SQ injection, Inject 1.7 mg into the skin once a week., Disp: 9 mL, Rfl: 0 [2] No Known Allergies

## 2024-04-18 NOTE — Telephone Encounter (Signed)
 Pharmacy Patient Advocate Encounter   Received notification from CoverMyMeds that prior authorization for Wegovy  0.25MG /0.5ML auto-injectors is required/requested.   Insurance verification completed.   The patient is insured through [employer].   Per test claim: PA required; PA started via CoverMyMeds. KEY [id number] . Waiting for clinical questions to populate.

## 2024-04-19 ENCOUNTER — Other Ambulatory Visit (HOSPITAL_COMMUNITY): Payer: Self-pay

## 2024-04-19 NOTE — Telephone Encounter (Signed)
 Pharmacy Patient Advocate Encounter  Received notification from [employer] that Prior Authorization for Wegovy  0.25MG /0.5ML auto-injectors has been APPROVED from 04/19/24 to 04/19/25. Ran test claim, Copay is $4.00. This test claim was processed through [facility] copay amounts may vary at other pharmacies due to pharmacy/plan contracts, or as the patient moves through the different stages of their insurance plan.   PA #/Case ID/Reference #: [id number]

## 2024-04-19 NOTE — Telephone Encounter (Signed)
 Pharmacy Patient Advocate Encounter  Received notification from [employer] that Prior Authorization for Desoximetasone  0.25% cream has been APPROVED from 04/18/24 to 04/18/25. Ran test claim, Copay is $4.00. This test claim was processed through [facility]- copay amounts may vary at other pharmacies due to pharmacy/plan contracts, or as the patient moves through the different stages of their insurance plan.   PA #/Case ID/Reference #: [id number]

## 2024-05-05 ENCOUNTER — Telehealth: Payer: Self-pay

## 2024-05-05 NOTE — Telephone Encounter (Signed)
 No answer left detailed vm to advice her we received a letter from [facility], informing Wegovy  was recalled on 04/05/24 due to hair found in a prefilled syringe. Patient can go to pharmacy and advise it was recalled.

## 2024-08-03 ENCOUNTER — Encounter: Admitting: Family Medicine
